# Patient Record
Sex: Female | Born: 1990 | Race: White | Hispanic: No | Marital: Single | Smoking: Former smoker
Health system: Southern US, Community
[De-identification: ages and names within clinical notes are randomized; demographics above are authoritative.]

## PROBLEM LIST (undated history)

## (undated) DIAGNOSIS — B2790 Infectious mononucleosis, unspecified without complication: Secondary | ICD-10-CM

## (undated) DIAGNOSIS — F329 Major depressive disorder, single episode, unspecified: Secondary | ICD-10-CM

## (undated) DIAGNOSIS — N809 Endometriosis, unspecified: Secondary | ICD-10-CM

## (undated) DIAGNOSIS — F419 Anxiety disorder, unspecified: Secondary | ICD-10-CM

## (undated) DIAGNOSIS — F32A Depression, unspecified: Secondary | ICD-10-CM

## (undated) DIAGNOSIS — N83209 Unspecified ovarian cyst, unspecified side: Secondary | ICD-10-CM

## (undated) DIAGNOSIS — N200 Calculus of kidney: Secondary | ICD-10-CM

## (undated) DIAGNOSIS — N289 Disorder of kidney and ureter, unspecified: Secondary | ICD-10-CM

## (undated) HISTORY — PX: LAPAROSCOPIC ENDOMETRIOSIS FULGURATION: SUR769

## (undated) HISTORY — DX: Major depressive disorder, single episode, unspecified: F32.9

## (undated) HISTORY — DX: Depression, unspecified: F32.A

## (undated) HISTORY — PX: TONSILLECTOMY: SUR1361

## (undated) HISTORY — PX: CHOLECYSTECTOMY: SHX55

---

## 2013-03-02 ENCOUNTER — Other Ambulatory Visit: Payer: Self-pay | Admitting: Student

## 2013-03-02 ENCOUNTER — Ambulatory Visit
Admission: RE | Admit: 2013-03-02 | Discharge: 2013-03-02 | Disposition: A | Discharge status: Home / Self Care | Payer: No Typology Code available for payment source | Source: Ambulatory Visit | Attending: Student | Admitting: Student

## 2013-03-02 DIAGNOSIS — R7611 Nonspecific reaction to tuberculin skin test without active tuberculosis: Secondary | ICD-10-CM

## 2013-06-19 ENCOUNTER — Emergency Department (HOSPITAL_COMMUNITY)
Admission: EM | Admit: 2013-06-19 | Discharge: 2013-06-20 | Disposition: A | Discharge status: Home / Self Care | Payer: 59 | Attending: Emergency Medicine | Admitting: Emergency Medicine

## 2013-06-19 ENCOUNTER — Encounter (HOSPITAL_COMMUNITY): Payer: Self-pay | Admitting: *Deleted

## 2013-06-19 DIAGNOSIS — Z3202 Encounter for pregnancy test, result negative: Secondary | ICD-10-CM | POA: Insufficient documentation

## 2013-06-19 DIAGNOSIS — R1031 Right lower quadrant pain: Secondary | ICD-10-CM | POA: Insufficient documentation

## 2013-06-19 DIAGNOSIS — R110 Nausea: Secondary | ICD-10-CM | POA: Insufficient documentation

## 2013-06-19 DIAGNOSIS — F17200 Nicotine dependence, unspecified, uncomplicated: Secondary | ICD-10-CM | POA: Insufficient documentation

## 2013-06-19 DIAGNOSIS — B9689 Other specified bacterial agents as the cause of diseases classified elsewhere: Secondary | ICD-10-CM | POA: Insufficient documentation

## 2013-06-19 DIAGNOSIS — R109 Unspecified abdominal pain: Secondary | ICD-10-CM

## 2013-06-19 DIAGNOSIS — N760 Acute vaginitis: Secondary | ICD-10-CM | POA: Insufficient documentation

## 2013-06-19 DIAGNOSIS — A499 Bacterial infection, unspecified: Secondary | ICD-10-CM | POA: Insufficient documentation

## 2013-06-19 LAB — CBC WITH DIFFERENTIAL/PLATELET
Eosinophils Absolute: 0.1 10*3/uL (ref 0.0–0.7)
Hemoglobin: 14.7 g/dL (ref 12.0–15.0)
Lymphs Abs: 2.7 10*3/uL (ref 0.7–4.0)
MCH: 32.8 pg (ref 26.0–34.0)
Monocytes Relative: 8 % (ref 3–12)
Neutro Abs: 4.7 10*3/uL (ref 1.7–7.7)
Neutrophils Relative %: 58 % (ref 43–77)
Platelets: 233 10*3/uL (ref 150–400)
RBC: 4.48 MIL/uL (ref 3.87–5.11)
WBC: 8.1 10*3/uL (ref 4.0–10.5)

## 2013-06-19 LAB — COMPREHENSIVE METABOLIC PANEL
ALT: 12 U/L (ref 0–35)
Albumin: 4.7 g/dL (ref 3.5–5.2)
Alkaline Phosphatase: 72 U/L (ref 39–117)
Chloride: 101 mEq/L (ref 96–112)
GFR calc Af Amer: >90 mL/min (ref >90)
Glucose, Bld: 94 mg/dL (ref 70–99)
Potassium: 3.7 mEq/L (ref 3.5–5.1)
Sodium: 137 mEq/L (ref 135–145)
Total Bilirubin: 0.2 mg/dL — ABNORMAL LOW (ref 0.3–1.2)
Total Protein: 7.8 g/dL (ref 6.0–8.3)

## 2013-06-19 LAB — URINALYSIS, ROUTINE W REFLEX MICROSCOPIC
Bilirubin Urine: NEGATIVE
Hgb urine dipstick: NEGATIVE
Ketones, ur: NEGATIVE mg/dL
Specific Gravity, Urine: 1.02 (ref 1.005–1.030)
pH: 5.5 (ref 5.0–8.0)

## 2013-06-19 NOTE — ED Notes (Signed)
The pt has had rlq pain for 2 weeks  Nausea  Diarrhea for 2 days.  lmp aug 28th.  Hx ovarian custs

## 2013-06-19 NOTE — ED Notes (Signed)
Pt states that has bilat side pains that have been going on for around 2 weeks. Pt states that the right side hurts worse.

## 2013-06-20 LAB — WET PREP, GENITAL
Trich, Wet Prep: NONE SEEN
Yeast Wet Prep HPF POC: NONE SEEN

## 2013-06-20 LAB — RPR: RPR Ser Ql: NONREACTIVE

## 2013-06-20 MED ORDER — AZITHROMYCIN 250 MG PO TABS
1000.0000 mg | ORAL_TABLET | Freq: Once | ORAL | Status: AC
  Administered 2013-06-20: 1000 mg via ORAL
  Filled 2013-06-20: qty 4

## 2013-06-20 MED ORDER — TRAMADOL HCL 50 MG PO TABS: 50.0000 mg | ORAL_TABLET | Freq: Four times a day (QID) | ORAL | Status: DC | PRN

## 2013-06-20 MED ORDER — METRONIDAZOLE 500 MG PO TABS: 500.0000 mg | ORAL_TABLET | Freq: Two times a day (BID) | ORAL | Status: DC

## 2013-06-20 MED ORDER — CEFTRIAXONE SODIUM 250 MG IJ SOLR
250.0000 mg | Freq: Once | INTRAMUSCULAR | Status: AC
  Administered 2013-06-20: 250 mg via INTRAMUSCULAR
  Filled 2013-06-20: qty 250

## 2013-06-20 MED ORDER — LIDOCAINE HCL (PF) 1 % IJ SOLN
INTRAMUSCULAR | Status: AC
  Administered 2013-06-20: 2 mL
  Filled 2013-06-20: qty 5

## 2013-06-20 MED ORDER — IBUPROFEN 200 MG PO TABS
600.0000 mg | ORAL_TABLET | Freq: Once | ORAL | Status: AC
  Administered 2013-06-20: 600 mg via ORAL
  Filled 2013-06-20: qty 3

## 2013-06-20 NOTE — ED Provider Notes (Signed)
Medical screening examination/treatment/procedure(s) were conducted as a shared visit with non-physician practitioner(s) and myself.  I personally evaluated the patient during the encounter  Brandt Loosen, MD 06/20/13 [id number]

## 2013-06-20 NOTE — ED Provider Notes (Signed)
CSN: [id number]     Arrival date & time 06/19/13  2118 History   First MD Initiated Contact with Patient 06/19/13 2217     Chief Complaint  Patient presents with  . Abdominal Pain    HPI  Jenna Blake is a 22 y.o. female with a PMH of endometriosis and ovarian cysts who presents to the ED for evaluation of abdominal pain.  History was provided by the patient.  Patient states she has had intermittent right lower quadrant abdominal pain for the past 2 weeks. She states her pain is a burning and stabbing sensation in her right lower quadrant with radiation to her right lower back. Nothing makes her pain worse. She states that when she lays down her pain feels better. She has tried taking Tylenol with no relief. She states that her pain is similar to ovarian cysts that she has had in the past. She states she also has endometriosis which is similar to her pain today as well. She states that she has had some vaginal bleeding the past 2 days. She denies any vaginal discharge, dysuria, or hematuria. Her last normal menstrual period was August 21.  She states that she's had intermittent nausea but no emesis. She has also had one day history of diarrhea which has resolved. She denies any hematochezia. She states she has a history of cholecystectomy however denies any other abdominal surgeries. She states she is otherwise been well with no recent fever, chills, rhinorrhea, congestion, sore throat, chest pain, shortness of breath, dizziness, lightheadedness, headache or leg edema. She is currently sexually active. She denies any new recent partners. She denies any history of STI's in the past.   History reviewed. No pertinent past medical history. History reviewed. No pertinent past surgical history. No family history on file. History  Substance Use Topics  . Smoking status: Current Every Day Smoker  . Smokeless tobacco: Not on file  . Alcohol Use: No   OB History   Grav Para Term Preterm Abortions TAB SAB  Ect Mult Living                 Review of Systems  Constitutional: Negative for fever, chills, activity change, appetite change and fatigue.  HENT: Negative for congestion, sore throat, rhinorrhea, neck pain and neck stiffness.   Eyes: Negative for visual disturbance.  Respiratory: Negative for cough and shortness of breath.   Cardiovascular: Negative for chest pain and leg swelling.  Gastrointestinal: Positive for nausea, abdominal pain and diarrhea (resolved). Negative for vomiting, constipation and blood in stool.  Endocrine: Negative for polyuria.  Genitourinary: Positive for vaginal bleeding. Negative for dysuria, hematuria, vaginal discharge, difficulty urinating, genital sores and vaginal pain.  Skin: Negative for rash and wound.  Neurological: Negative for dizziness, syncope, weakness, light-headedness, numbness and headaches.  Psychiatric/Behavioral: Negative for confusion.    Allergies  Ativan and Betadine  Home Medications   Current Outpatient Rx  Name  Route  Sig  Dispense  Refill  . acetaminophen (TYLENOL) 325 MG tablet   Oral   Take 325 mg by mouth every 6 (six) hours as needed for pain.         Marland Kitchen ibuprofen (ADVIL,MOTRIN) 200 MG tablet   Oral   Take 400 mg by mouth every 6 (six) hours as needed for pain.         . Pseudoeph-Doxylamine-DM-APAP (NYQUIL PO)   Oral   Take 15 mLs by mouth daily as needed (flu like symptoms).         Marland Kitchen  metroNIDAZOLE (FLAGYL) 500 MG tablet   Oral   Take 1 tablet (500 mg total) by mouth 2 (two) times daily.   14 tablet   0   . traMADol (ULTRAM) 50 MG tablet   Oral   Take 1 tablet (50 mg total) by mouth every 6 (six) hours as needed for pain.   15 tablet   0    BP 127/66  Pulse 37  Temp(Src) 98 F (36.7 C) (Oral)  Resp 16  SpO2 99%  LMP 05/27/2013  Filed Vitals:   06/20/13 0015 06/20/13 0030 06/20/13 0045 06/20/13 0100  BP: 129/67 127/66 122/62 137/75  Pulse: 86 37 72 93  Temp:      TempSrc:      Resp:       SpO2: 99% 99% 97% 100%    Physical Exam  Nursing note and vitals reviewed. Constitutional: She is oriented to person, place, and time. She appears well-developed and well-nourished. No distress.  HENT:  Head: Normocephalic and atraumatic.  Right Ear: External ear normal.  Left Ear: External ear normal.  Nose: Nose normal.  Mouth/Throat: Oropharynx is clear and moist. No oropharyngeal exudate.  Eyes: Conjunctivae are normal. Pupils are equal, round, and reactive to light. Right eye exhibits no discharge. Left eye exhibits no discharge.  Neck: Normal range of motion. Neck supple.  Cardiovascular: Normal rate, regular rhythm, normal heart sounds and intact distal pulses.  Exam reveals no gallop and no friction rub.   No murmur heard. Pulmonary/Chest: Effort normal and breath sounds normal. No respiratory distress. She has no wheezes. She has no rales. She exhibits no tenderness.  Abdominal: Soft. Bowel sounds are normal. She exhibits no distension and no mass. There is tenderness. There is no rebound and no guarding.  Mild RLQ tenderness to palpation.  No increased right lower quadrant tenderness with left lower quadrant tenderness.  Genitourinary: Vaginal discharge found.  Cervical motion tenderness present. Right adnexal tenderness. No left adnexal tenderness. Moderate amount of thick white vaginal discharge present in the vaginal vault. No vaginal bleeding. No external genital sores or lacerations.  Musculoskeletal: Normal range of motion. She exhibits no edema and no tenderness.  Mild tenderness to palpation to the right lower lumbar region. Patient able to sit up for exam with ease. No CVA tenderness bilaterally  Neurological: She is alert and oriented to person, place, and time.  Skin: Skin is warm and dry. She is not diaphoretic.    ED Course  Procedures (including critical care time) Labs Review Labs Reviewed  WET PREP, GENITAL - Abnormal; Notable for the following:    Clue  Cells Wet Prep HPF POC FEW (*)    WBC, Wet Prep HPF POC FEW (*)    All other components within normal limits  COMPREHENSIVE METABOLIC PANEL - Abnormal; Notable for the following:    Total Bilirubin 0.2 (*)    All other components within normal limits  GC/CHLAMYDIA PROBE AMP  CBC WITH DIFFERENTIAL  LIPASE, BLOOD  URINALYSIS, ROUTINE W REFLEX MICROSCOPIC  RPR  HIV ANTIBODY (ROUTINE TESTING)  POCT PREGNANCY, URINE   Imaging Review No results found.  Results for orders placed during the hospital encounter of 06/19/13  WET PREP, GENITAL      Result Value Range   Yeast Wet Prep HPF POC NONE SEEN  NONE SEEN   Trich, Wet Prep NONE SEEN  NONE SEEN   Clue Cells Wet Prep HPF POC FEW (*) NONE SEEN   WBC, Wet Prep HPF POC  FEW (*) NONE SEEN  CBC WITH DIFFERENTIAL      Result Value Range   WBC 8.1  4.0 - 10.5 K/uL   RBC 4.48  3.87 - 5.11 MIL/uL   Hemoglobin 14.7  12.0 - 15.0 g/dL   HCT 30.8  65.7 - 84.6 %   MCV 94.0  78.0 - 100.0 fL   MCH 32.8  26.0 - 34.0 pg   MCHC 34.9  30.0 - 36.0 g/dL   RDW 96.2  95.2 - 84.1 %   Platelets 233  150 - 400 K/uL   Neutrophils Relative % 58  43 - 77 %   Neutro Abs 4.7  1.7 - 7.7 K/uL   Lymphocytes Relative 33  12 - 46 %   Lymphs Abs 2.7  0.7 - 4.0 K/uL   Monocytes Relative 8  3 - 12 %   Monocytes Absolute 0.6  0.1 - 1.0 K/uL   Eosinophils Relative 1  0 - 5 %   Eosinophils Absolute 0.1  0.0 - 0.7 K/uL   Basophils Relative 0  0 - 1 %   Basophils Absolute 0.0  0.0 - 0.1 K/uL  COMPREHENSIVE METABOLIC PANEL      Result Value Range   Sodium 137  135 - 145 mEq/L   Potassium 3.7  3.5 - 5.1 mEq/L   Chloride 101  96 - 112 mEq/L   CO2 26  19 - 32 mEq/L   Glucose, Bld 94  70 - 99 mg/dL   BUN 9  6 - 23 mg/dL   Creatinine, Ser 3.24  0.50 - 1.10 mg/dL   Calcium 9.5  8.4 - 40.1 mg/dL   Total Protein 7.8  6.0 - 8.3 g/dL   Albumin 4.7  3.5 - 5.2 g/dL   AST 23  0 - 37 U/L   ALT 12  0 - 35 U/L   Alkaline Phosphatase 72  39 - 117 U/L   Total Bilirubin 0.2 (*)  0.3 - 1.2 mg/dL   GFR calc non Af Amer >90  >90 mL/min   GFR calc Af Amer >90  >90 mL/min  LIPASE, BLOOD      Result Value Range   Lipase 19  11 - 59 U/L  URINALYSIS, ROUTINE W REFLEX MICROSCOPIC      Result Value Range   Color, Urine YELLOW  YELLOW   APPearance CLEAR  CLEAR   Specific Gravity, Urine 1.020  1.005 - 1.030   pH 5.5  5.0 - 8.0   Glucose, UA NEGATIVE  NEGATIVE mg/dL   Hgb urine dipstick NEGATIVE  NEGATIVE   Bilirubin Urine NEGATIVE  NEGATIVE   Ketones, ur NEGATIVE  NEGATIVE mg/dL   Protein, ur NEGATIVE  NEGATIVE mg/dL   Urobilinogen, UA 0.2  0.0 - 1.0 mg/dL   Nitrite NEGATIVE  NEGATIVE   Leukocytes, UA NEGATIVE  NEGATIVE  POCT PREGNANCY, URINE      Result Value Range   Preg Test, Ur NEGATIVE  NEGATIVE     MDM   1. Abdominal pain   2. Bacterial vaginosis     Jenna Blake is a 22 y.o. female with a PMH of endometriosis and ovarian cysts who presents to the ED for evaluation of abdominal pain. CBC, CMP, lipase, UA and urine pregnancy ordered to further evaluate abdominal pain.  Ibuprofen was ordered for symptomatic relief.     Rechecks  11:50 PM = pelvic exam performed at bedside with RN Cassie present.  Wet mount and GC Chlamydia ordered.  HIV and RPR ordered.  Azithromycin and Rocephin ordered. 1:00 AM = Patient smiling and talking with her boyfriend who is present in the ED. No acute distress.     Etiology of abdominal pain is possibly due to ovarian cysts vs. cervicitis vs. endometriosis.  Patient states she has a history of ovarian cysts and endometriosis which is similar to her pain today.  Appendicitis is unlikely given over a 2 week history of similar unchanged and intermittent abdominal pain.  She also has no leukocytosis, fever, or emesis.  Patient is non-toxic in appearance and remained in no acute distress.  Patient had cervical motion tenderness and right adnexal tenderness on exam. She was treated with azithromycin and Rocephin in the emergency  department. Her GC results are pending. Patient was prescribed tramadol for outpatient management of her pain.  She was also prescribed Flagyl for bacterial vaginosis.  He shouldn't encouraged not to drink alcohol while taking this medication.  Patient was given appendicitis precautions. Patient was instructed to return to the ED if they experience any worsening/changing pain, vomiting, fever, or other concerns.  Patient was given referral to the women's health clinic and instructed to make an appointment as soon as possible. Patient was in agreement with discharge and plan.       Final impressions: 1. Abdominal pain 2. Bacterial vaginosis     Luiz Iron PA-C   This patient was discussed with Dr. Lavella Lemons who evaluated the patient in the ED.         Jillyn Ledger, PA-C 06/20/13 0221

## 2013-08-22 ENCOUNTER — Emergency Department (HOSPITAL_COMMUNITY)
Admission: EM | Admit: 2013-08-22 | Discharge: 2013-08-22 | Disposition: A | Discharge status: Home / Self Care | Payer: 59 | Attending: Emergency Medicine | Admitting: Emergency Medicine

## 2013-08-22 ENCOUNTER — Encounter (HOSPITAL_COMMUNITY): Payer: Self-pay | Admitting: Emergency Medicine

## 2013-08-22 DIAGNOSIS — Z880 Allergy status to penicillin: Secondary | ICD-10-CM | POA: Insufficient documentation

## 2013-08-22 DIAGNOSIS — N809 Endometriosis, unspecified: Secondary | ICD-10-CM

## 2013-08-22 DIAGNOSIS — R630 Anorexia: Secondary | ICD-10-CM | POA: Insufficient documentation

## 2013-08-22 DIAGNOSIS — F17200 Nicotine dependence, unspecified, uncomplicated: Secondary | ICD-10-CM | POA: Insufficient documentation

## 2013-08-22 DIAGNOSIS — N946 Dysmenorrhea, unspecified: Secondary | ICD-10-CM

## 2013-08-22 DIAGNOSIS — R110 Nausea: Secondary | ICD-10-CM | POA: Insufficient documentation

## 2013-08-22 DIAGNOSIS — N898 Other specified noninflammatory disorders of vagina: Secondary | ICD-10-CM | POA: Insufficient documentation

## 2013-08-22 DIAGNOSIS — Z3202 Encounter for pregnancy test, result negative: Secondary | ICD-10-CM | POA: Insufficient documentation

## 2013-08-22 HISTORY — DX: Endometriosis, unspecified: N80.9

## 2013-08-22 LAB — CBC WITH DIFFERENTIAL/PLATELET
Basophils Absolute: 0 10*3/uL (ref 0.0–0.1)
Basophils Relative: 0 % (ref 0–1)
Eosinophils Absolute: 0.1 10*3/uL (ref 0.0–0.7)
Eosinophils Relative: 1 % (ref 0–5)
HCT: 38.4 % (ref 36.0–46.0)
Lymphocytes Relative: 34 % (ref 12–46)
MCH: 33.4 pg (ref 26.0–34.0)
MCHC: 35.7 g/dL (ref 30.0–36.0)
MCV: 93.7 fL (ref 78.0–100.0)
Monocytes Absolute: 0.7 10*3/uL (ref 0.1–1.0)
RDW: 12.5 % (ref 11.5–15.5)

## 2013-08-22 LAB — URINALYSIS, ROUTINE W REFLEX MICROSCOPIC
Nitrite: NEGATIVE
Specific Gravity, Urine: 1.033 — ABNORMAL HIGH (ref 1.005–1.030)
Urobilinogen, UA: 1 mg/dL (ref 0.0–1.0)

## 2013-08-22 LAB — COMPREHENSIVE METABOLIC PANEL
AST: 21 U/L (ref 0–37)
CO2: 25 mEq/L (ref 19–32)
Calcium: 9.2 mg/dL (ref 8.4–10.5)
Creatinine, Ser: 0.62 mg/dL (ref 0.50–1.10)
GFR calc non Af Amer: >90 mL/min (ref >90)

## 2013-08-22 LAB — URINE MICROSCOPIC-ADD ON

## 2013-08-22 LAB — POCT PREGNANCY, URINE: Preg Test, Ur: NEGATIVE

## 2013-08-22 MED ORDER — ONDANSETRON 4 MG PO TBDP
4.0000 mg | ORAL_TABLET | Freq: Once | ORAL | Status: AC
  Administered 2013-08-22: 4 mg via ORAL
  Filled 2013-08-22: qty 1

## 2013-08-22 MED ORDER — TRAMADOL HCL 50 MG PO TABS: 50.0000 mg | ORAL_TABLET | Freq: Four times a day (QID) | ORAL | Status: DC | PRN

## 2013-08-22 MED ORDER — KETOROLAC TROMETHAMINE 30 MG/ML IJ SOLN
30.0000 mg | Freq: Once | INTRAMUSCULAR | Status: AC
  Administered 2013-08-22: 30 mg via INTRAMUSCULAR
  Filled 2013-08-22: qty 1

## 2013-08-22 MED ORDER — TRAMADOL HCL 50 MG PO TABS
50.0000 mg | ORAL_TABLET | Freq: Once | ORAL | Status: AC
  Administered 2013-08-22: 50 mg via ORAL
  Filled 2013-08-22: qty 1

## 2013-08-22 NOTE — ED Notes (Signed)
Pt unable to void for UA sample at this time. 

## 2013-08-22 NOTE — ED Provider Notes (Addendum)
CSN: [id number]     Arrival date & time 08/22/13  1044 History   First MD Initiated Contact with Patient 08/22/13 1109     Chief Complaint  Patient presents with  . Abdominal Pain   (Consider location/radiation/quality/duration/timing/severity/associated sxs/prior Treatment) Patient is a 22 y.o. female presenting with abdominal pain. The history is provided by the patient.  Abdominal Pain Pain location: bilateral pelvic pain. Pain quality: cramping, gnawing, squeezing and stabbing   Pain radiates to:  Does not radiate Pain severity:  Severe Onset quality:  Gradual Duration:  2 hours Timing:  Constant Progression:  Worsening Chronicity:  Recurrent Context comment:  Hx of endometriosis and started menses today Relieved by:  Nothing Worsened by:  Nothing tried Ineffective treatments:  NSAIDs Associated symptoms: anorexia, nausea and vaginal bleeding   Associated symptoms: no constipation, no diarrhea, no dysuria, no fever and no vomiting   Risk factors: NSAID use     Past Medical History  Diagnosis Date  . Endometriosis    Past Surgical History  Procedure Laterality Date  . Cholecystectomy     History reviewed. No pertinent family history. History  Substance Use Topics  . Smoking status: Current Every Day Smoker    Types: Cigarettes  . Smokeless tobacco: Not on file  . Alcohol Use: No   OB History   Grav Para Term Preterm Abortions TAB SAB Ect Mult Living                 Review of Systems  Constitutional: Negative for fever.  Gastrointestinal: Positive for nausea, abdominal pain and anorexia. Negative for vomiting, diarrhea and constipation.  Genitourinary: Positive for vaginal bleeding and menstrual problem. Negative for dysuria.  All other systems reviewed and are negative.    Allergies  Amoxicillin; Ativan; and Betadine  Home Medications  No current outpatient prescriptions on file. BP 127/75  Pulse 87  Temp(Src) 98.4 F (36.9 C) (Oral)  Resp 18  Wt  118 lb 3 oz (53.609 kg)  SpO2 100%  LMP 08/22/2013 Physical Exam  Nursing note and vitals reviewed. Constitutional: She is oriented to person, place, and time. She appears well-developed and well-nourished. She appears distressed.  HENT:  Head: Normocephalic and atraumatic.  Mouth/Throat: Oropharynx is clear and moist.  Eyes: Conjunctivae and EOM are normal. Pupils are equal, round, and reactive to light.  Neck: Normal range of motion. Neck supple.  Cardiovascular: Normal rate, regular rhythm and intact distal pulses.   No murmur heard. Pulmonary/Chest: Effort normal and breath sounds normal. No respiratory distress. She has no wheezes. She has no rales.  Abdominal: Soft. She exhibits no distension. There is tenderness. There is no rebound and no guarding.    Lower pelvic tenderness bilaterally  Musculoskeletal: Normal range of motion. She exhibits no edema and no tenderness.  Neurological: She is alert and oriented to person, place, and time.  Skin: Skin is warm and dry. No rash noted. No erythema.  Psychiatric: She has a normal mood and affect. Her behavior is normal.    ED Course  Procedures (including critical care time) Labs Review Labs Reviewed  URINALYSIS, ROUTINE W REFLEX MICROSCOPIC - Abnormal; Notable for the following:    APPearance CLOUDY (*)    Specific Gravity, Urine 1.033 (*)    Hgb urine dipstick LARGE (*)    Leukocytes, UA TRACE (*)    All other components within normal limits  URINE MICROSCOPIC-ADD ON - Abnormal; Notable for the following:    Squamous Epithelial / LPF FEW (*)  Bacteria, UA FEW (*)    All other components within normal limits  CBC WITH DIFFERENTIAL  COMPREHENSIVE METABOLIC PANEL  POCT PREGNANCY, URINE   Imaging Review No results found.  EKG Interpretation   None       MDM   1. Dysmenorrhea   2. Endometriosis     Patient presents with severe pelvic pain that started today with her menses. She has a history of endometriosis  and states that intermittently she'll gets severe pain like this the last episode approximately one year ago. She has not missed any of her periods and tried Advil at home without improvement. She has normal vital signs note for abdominal pain the pain is in bilateral lower pelvic quadrants consistent with menses. No symptoms concerning for STD patient was recently checked approximately 2 months ago and was negative for GC Chlamydia, HIV and syphilis.  UA and UPT pending.  Pt given zofran and toradol for pain.  12:34 PM UPT neg.  Gwyneth Sprout, MD 08/22/13 1234  Gwyneth Sprout, MD 08/22/13 1247

## 2013-08-22 NOTE — ED Notes (Signed)
Pt reports lower abd pain that started about an hour ago. Reports some nausea but no vomiting. Skin warm and dry. Denies any pain with urination.

## 2013-08-29 ENCOUNTER — Encounter (HOSPITAL_COMMUNITY): Payer: Self-pay | Admitting: Emergency Medicine

## 2013-08-29 ENCOUNTER — Emergency Department (HOSPITAL_COMMUNITY)
Admission: EM | Admit: 2013-08-29 | Discharge: 2013-08-29 | Disposition: A | Discharge status: Home / Self Care | Payer: 59 | Attending: Emergency Medicine | Admitting: Emergency Medicine

## 2013-08-29 ENCOUNTER — Emergency Department (HOSPITAL_COMMUNITY): Payer: 59

## 2013-08-29 DIAGNOSIS — N949 Unspecified condition associated with female genital organs and menstrual cycle: Secondary | ICD-10-CM | POA: Insufficient documentation

## 2013-08-29 DIAGNOSIS — Z3202 Encounter for pregnancy test, result negative: Secondary | ICD-10-CM | POA: Insufficient documentation

## 2013-08-29 DIAGNOSIS — Z87891 Personal history of nicotine dependence: Secondary | ICD-10-CM | POA: Insufficient documentation

## 2013-08-29 DIAGNOSIS — G8929 Other chronic pain: Secondary | ICD-10-CM | POA: Insufficient documentation

## 2013-08-29 DIAGNOSIS — R197 Diarrhea, unspecified: Secondary | ICD-10-CM | POA: Insufficient documentation

## 2013-08-29 DIAGNOSIS — R102 Pelvic and perineal pain: Secondary | ICD-10-CM

## 2013-08-29 DIAGNOSIS — R110 Nausea: Secondary | ICD-10-CM | POA: Insufficient documentation

## 2013-08-29 DIAGNOSIS — N926 Irregular menstruation, unspecified: Secondary | ICD-10-CM | POA: Insufficient documentation

## 2013-08-29 DIAGNOSIS — Z9089 Acquired absence of other organs: Secondary | ICD-10-CM | POA: Insufficient documentation

## 2013-08-29 DIAGNOSIS — N939 Abnormal uterine and vaginal bleeding, unspecified: Secondary | ICD-10-CM | POA: Insufficient documentation

## 2013-08-29 LAB — URINALYSIS, ROUTINE W REFLEX MICROSCOPIC
Bilirubin Urine: NEGATIVE
Glucose, UA: NEGATIVE mg/dL
Hgb urine dipstick: NEGATIVE
Ketones, ur: NEGATIVE mg/dL
pH: 6 (ref 5.0–8.0)

## 2013-08-29 LAB — URINE MICROSCOPIC-ADD ON

## 2013-08-29 MED ORDER — MORPHINE SULFATE 4 MG/ML IJ SOLN
4.0000 mg | Freq: Once | INTRAMUSCULAR | Status: AC
  Administered 2013-08-29: 4 mg via INTRAVENOUS
  Filled 2013-08-29: qty 1

## 2013-08-29 MED ORDER — KETOROLAC TROMETHAMINE 30 MG/ML IJ SOLN
30.0000 mg | Freq: Once | INTRAMUSCULAR | Status: AC
  Administered 2013-08-29: 30 mg via INTRAVENOUS
  Filled 2013-08-29: qty 1

## 2013-08-29 MED ORDER — ONDANSETRON HCL 4 MG PO TABS: 4.0000 mg | ORAL_TABLET | Freq: Four times a day (QID) | ORAL | Status: DC

## 2013-08-29 MED ORDER — ONDANSETRON HCL 4 MG/2ML IJ SOLN
4.0000 mg | Freq: Once | INTRAMUSCULAR | Status: AC
  Administered 2013-08-29: 4 mg via INTRAVENOUS
  Filled 2013-08-29: qty 2

## 2013-08-29 MED ORDER — TRAMADOL HCL 50 MG PO TABS: 50.0000 mg | ORAL_TABLET | Freq: Four times a day (QID) | ORAL | Status: DC | PRN

## 2013-08-29 MED ORDER — ONDANSETRON 4 MG PO TBDP
8.0000 mg | ORAL_TABLET | Freq: Once | ORAL | Status: AC
  Administered 2013-08-29: 8 mg via ORAL
  Filled 2013-08-29: qty 2

## 2013-08-29 NOTE — ED Provider Notes (Signed)
CSN: [id number]     Arrival date & time 08/29/13  1457 History   First MD Initiated Contact with Patient 08/29/13 1531     Chief Complaint  Patient presents with  . Abdominal Pain  . Nausea  . Diarrhea   (Consider location/radiation/quality/duration/timing/severity/associated sxs/prior Treatment) HPI Pt is a 22yo female with hx of endometriosis, ovarian cycsts and chronic abdominal pain c/o 1 week hx of severe lower abdominal pain that is sharp and cramping in nature, 10/10.  Pt was seen on 11/16 for same after pain initially started and gradually worsened but was discharged home with tramadol, which pt states provides moderate relief.  Reports having been on her menstrual cycle the last week but ended 2 days ago, still having pain.  Pt thought pain would subside after menstruation stopped.  Reports having nausea w/o vomiting but 1-2 episodes of diarrhea for the last week w/o blood or mucous.  Reports having cholecystectomy as well as procedures 2 years ago to remove some of her endometriosis. Denies fever. Denies dysuria or vaginal discharge.  Had negative STD screening 1 mo ago.  Past Medical History  Diagnosis Date  . Endometriosis    Past Surgical History  Procedure Laterality Date  . Cholecystectomy     No family history on file. History  Substance Use Topics  . Smoking status: Former Smoker    Types: Cigarettes  . Smokeless tobacco: Not on file  . Alcohol Use: No     Comment: Former user   OB History   Grav Para Term Preterm Abortions TAB SAB Ect Mult Living                 Review of Systems  Constitutional: Negative for fever and chills.  Gastrointestinal: Positive for nausea, abdominal pain and diarrhea. Negative for vomiting.  Genitourinary: Positive for menstrual problem and pelvic pain. Negative for dysuria, urgency, hematuria, flank pain, decreased urine volume, vaginal bleeding, vaginal discharge and vaginal pain.  All other systems reviewed and are  negative.    Allergies  Amoxicillin; Ativan; and Betadine  Home Medications   Current Outpatient Rx  Name  Route  Sig  Dispense  Refill  . ondansetron (ZOFRAN) 4 MG tablet   Oral   Take 1 tablet (4 mg total) by mouth every 6 (six) hours.   12 tablet   0   . traMADol (ULTRAM) 50 MG tablet   Oral   Take 1 tablet (50 mg total) by mouth every 6 (six) hours as needed for moderate pain.   10 tablet   0    BP 98/56  Pulse 80  Temp(Src) 98.3 F (36.8 C) (Oral)  Resp 18  Ht 5\' 2"  (1.575 m)  Wt 113 lb 8 oz (51.483 kg)  BMI 20.75 kg/m2  SpO2 99%  LMP 08/22/2013 Physical Exam  Nursing note and vitals reviewed. Constitutional: She appears well-developed and well-nourished. No distress.  HENT:  Head: Normocephalic and atraumatic.  Eyes: Conjunctivae are normal. No scleral icterus.  Neck: Normal range of motion.  Cardiovascular: Normal rate, regular rhythm and normal heart sounds.   Pulmonary/Chest: Effort normal and breath sounds normal. No respiratory distress. She has no wheezes. She has no rales. She exhibits no tenderness.  Abdominal: Soft. Bowel sounds are normal. She exhibits no distension and no mass. There is tenderness. There is no rebound ( lower abdomen, greatest in LLQ) and no guarding.    Genitourinary: No vaginal discharge found.  Musculoskeletal: Normal range of motion.  Neurological: She  is alert.  Skin: Skin is warm and dry. She is not diaphoretic.    ED Course  Procedures (including critical care time) Labs Review Labs Reviewed  URINALYSIS, ROUTINE W REFLEX MICROSCOPIC - Abnormal; Notable for the following:    APPearance CLOUDY (*)    Leukocytes, UA SMALL (*)    All other components within normal limits  URINE MICROSCOPIC-ADD ON - Abnormal; Notable for the following:    Squamous Epithelial / LPF FEW (*)    All other components within normal limits  PREGNANCY, URINE   Imaging Review US Transvaginal Non-ob  08/29/2013   CLINICAL DATA:  Left-sided  pelvic pain. Endometriosis. LMP 08/21/2013  EXAM: TRANSABDOMINAL AND TRANSVAGINAL ULTRASOUND OF PELVIS  TECHNIQUE: Both transabdominal and transvaginal ultrasound examinations of the pelvis were performed. Transabdominal technique was performed for global imaging of the pelvis including uterus, ovaries, adnexal regions, and pelvic cul-de-sac. It was necessary to proceed with endovaginal exam following the transabdominal exam to visualize the endometrium and ovaries.  COMPARISON:  None  FINDINGS: Uterus  Measurements: 6.4 x 2.6 x 3.7 cm. No fibroids or other mass visualized.  Endometrium  Thickness: 2 mm.  No focal abnormality visualized.  Right ovary  Measurements: 3.7 x 2.0 x 1.9 cm. Normal appearance/no adnexal mass.  Left ovary  Measurements: 3.1 x 2.4 x 1.8 cm. Normal appearance/no adnexal mass.  Other findings  Tiny amount of free fluid noted in right adnexa.  IMPRESSION: Negative. No pelvic mass or other significant abnormality identified.   Electronically Signed   By: Myles Rosenthal M.D.   On: 08/29/2013 18:14   US Pelvis Complete  08/29/2013   CLINICAL DATA:  Left-sided pelvic pain. Endometriosis. LMP 08/21/2013  EXAM: TRANSABDOMINAL AND TRANSVAGINAL ULTRASOUND OF PELVIS  TECHNIQUE: Both transabdominal and transvaginal ultrasound examinations of the pelvis were performed. Transabdominal technique was performed for global imaging of the pelvis including uterus, ovaries, adnexal regions, and pelvic cul-de-sac. It was necessary to proceed with endovaginal exam following the transabdominal exam to visualize the endometrium and ovaries.  COMPARISON:  None  FINDINGS: Uterus  Measurements: 6.4 x 2.6 x 3.7 cm. No fibroids or other mass visualized.  Endometrium  Thickness: 2 mm.  No focal abnormality visualized.  Right ovary  Measurements: 3.7 x 2.0 x 1.9 cm. Normal appearance/no adnexal mass.  Left ovary  Measurements: 3.1 x 2.4 x 1.8 cm. Normal appearance/no adnexal mass.  Other findings  Tiny amount of free fluid  noted in right adnexa.  IMPRESSION: Negative. No pelvic mass or other significant abnormality identified.   Electronically Signed   By: Myles Rosenthal M.D.   On: 08/29/2013 18:14    EKG Interpretation   None       MDM   1. Pelvic pain    Pt with hx of endometreosis c/o lower abdominal and pelvic pain, similar to previous episodes, however has not improved since onset, 11/16 where she was evaluated in ED and discharged with pain medication. Reviewed pt's medical chart, normal CBC and CMP during that visit. Pt reports no new sexual partners and negative STD screen 24mo ago.  No vaginal discharge.  Urine preg: negative UA: unremarkable Pelvic U/S: no acute findings.  Pain did improve with morphine and zofran in ED  Discussed findings with pt, advised that pain is likely due to her endometriosis and will need to establish GYN care, [facility] further evaluation and management of pelvic pain.  Rx: tramadol and zofran. Return precautions provided. Pt verbalized understanding  and agreement with tx plan.   Junius Finner, PA-C 08/29/13 1857

## 2013-08-29 NOTE — ED Notes (Signed)
The pt has n at present no distress

## 2013-08-29 NOTE — ED Notes (Signed)
Patient transported to Ultrasound 

## 2013-08-29 NOTE — ED Notes (Signed)
The pt has had chronic abd pain and has been seen here 2 other times in the pastmonth for the same.  lmp 2 days ago.

## 2013-08-29 NOTE — ED Notes (Signed)
The pt reports that her pain is not much better 

## 2013-08-29 NOTE — ED Notes (Signed)
Pt c/o lower abdominal pain that radiates to low back onset 08/22/2013. Pt seen here at onset but not feeling any better. Pt reports nausea and diarrhea.

## 2013-08-29 NOTE — ED Provider Notes (Signed)
Medical screening examination/treatment/procedure(s) were performed by non-physician practitioner and as supervising physician I was immediately available for consultation/collaboration.  EKG Interpretation   None         Richardean Canal, MD 08/29/13 2004

## 2013-09-07 ENCOUNTER — Emergency Department (HOSPITAL_COMMUNITY)
Admission: EM | Admit: 2013-09-07 | Discharge: 2013-09-08 | Disposition: A | Discharge status: Home / Self Care | Payer: 59 | Attending: Emergency Medicine | Admitting: Emergency Medicine

## 2013-09-07 ENCOUNTER — Encounter (HOSPITAL_COMMUNITY): Payer: Self-pay | Admitting: Emergency Medicine

## 2013-09-07 DIAGNOSIS — R350 Frequency of micturition: Secondary | ICD-10-CM | POA: Insufficient documentation

## 2013-09-07 DIAGNOSIS — R109 Unspecified abdominal pain: Secondary | ICD-10-CM

## 2013-09-07 DIAGNOSIS — Z87891 Personal history of nicotine dependence: Secondary | ICD-10-CM | POA: Insufficient documentation

## 2013-09-07 DIAGNOSIS — M545 Low back pain, unspecified: Secondary | ICD-10-CM | POA: Insufficient documentation

## 2013-09-07 DIAGNOSIS — R1084 Generalized abdominal pain: Secondary | ICD-10-CM | POA: Insufficient documentation

## 2013-09-07 DIAGNOSIS — M546 Pain in thoracic spine: Secondary | ICD-10-CM | POA: Insufficient documentation

## 2013-09-07 DIAGNOSIS — IMO0001 Reserved for inherently not codable concepts without codable children: Secondary | ICD-10-CM | POA: Insufficient documentation

## 2013-09-07 DIAGNOSIS — Z3202 Encounter for pregnancy test, result negative: Secondary | ICD-10-CM | POA: Insufficient documentation

## 2013-09-07 DIAGNOSIS — Z8742 Personal history of other diseases of the female genital tract: Secondary | ICD-10-CM | POA: Insufficient documentation

## 2013-09-07 DIAGNOSIS — Z881 Allergy status to other antibiotic agents status: Secondary | ICD-10-CM | POA: Insufficient documentation

## 2013-09-07 DIAGNOSIS — Z888 Allergy status to other drugs, medicaments and biological substances status: Secondary | ICD-10-CM | POA: Insufficient documentation

## 2013-09-07 LAB — COMPREHENSIVE METABOLIC PANEL
ALT: 9 U/L (ref 0–35)
AST: 20 U/L (ref 0–37)
Albumin: 4.5 g/dL (ref 3.5–5.2)
BUN: 7 mg/dL (ref 6–23)
CO2: 26 mEq/L (ref 19–32)
Calcium: 9 mg/dL (ref 8.4–10.5)
Chloride: 102 mEq/L (ref 96–112)
GFR calc Af Amer: >90 mL/min (ref >90)
GFR calc non Af Amer: >90 mL/min (ref >90)
Glucose, Bld: 91 mg/dL (ref 70–99)
Sodium: 139 mEq/L (ref 135–145)
Total Bilirubin: 0.4 mg/dL (ref 0.3–1.2)

## 2013-09-07 LAB — CBC WITH DIFFERENTIAL/PLATELET
Eosinophils Absolute: 0 10*3/uL (ref 0.0–0.7)
Eosinophils Relative: 0 % (ref 0–5)
HCT: 38.5 % (ref 36.0–46.0)
Lymphocytes Relative: 41 % (ref 12–46)
Lymphs Abs: 3 10*3/uL (ref 0.7–4.0)
MCV: 94.1 fL (ref 78.0–100.0)
Monocytes Absolute: 0.8 10*3/uL (ref 0.1–1.0)
Monocytes Relative: 11 % (ref 3–12)
Platelets: 220 10*3/uL (ref 150–400)
RBC: 4.09 MIL/uL (ref 3.87–5.11)
WBC: 7.2 10*3/uL (ref 4.0–10.5)

## 2013-09-07 LAB — URINE MICROSCOPIC-ADD ON

## 2013-09-07 LAB — URINALYSIS, ROUTINE W REFLEX MICROSCOPIC
Glucose, UA: NEGATIVE mg/dL
Ketones, ur: NEGATIVE mg/dL
Specific Gravity, Urine: 1.02 (ref 1.005–1.030)

## 2013-09-07 NOTE — ED Notes (Signed)
Pt. reports left upper flank pain onset today , denies injury , no dysuria or hematuria , no fever or chills.

## 2013-09-08 ENCOUNTER — Encounter (HOSPITAL_COMMUNITY): Payer: Self-pay | Admitting: Radiology

## 2013-09-08 ENCOUNTER — Emergency Department (HOSPITAL_COMMUNITY): Payer: 59

## 2013-09-08 LAB — WET PREP, GENITAL: Clue Cells Wet Prep HPF POC: NONE SEEN

## 2013-09-08 MED ORDER — IOHEXOL 300 MG/ML  SOLN
25.0000 mL | INTRAMUSCULAR | Status: AC
  Administered 2013-09-08: 25 mL via ORAL

## 2013-09-08 MED ORDER — SODIUM CHLORIDE 0.9 % IV BOLUS (SEPSIS)
1000.0000 mL | Freq: Once | INTRAVENOUS | Status: AC
  Administered 2013-09-08: 1000 mL via INTRAVENOUS

## 2013-09-08 MED ORDER — HYDROMORPHONE HCL PF 1 MG/ML IJ SOLN
1.0000 mg | Freq: Once | INTRAMUSCULAR | Status: AC
  Administered 2013-09-08: 1 mg via INTRAVENOUS
  Filled 2013-09-08: qty 1

## 2013-09-08 MED ORDER — TRAMADOL HCL 50 MG PO TABS: 50.0000 mg | ORAL_TABLET | Freq: Four times a day (QID) | ORAL | Status: DC | PRN

## 2013-09-08 MED ORDER — IOHEXOL 300 MG/ML  SOLN
100.0000 mL | Freq: Once | INTRAMUSCULAR | Status: AC | PRN
  Administered 2013-09-08: 100 mL via INTRAVENOUS

## 2013-09-08 MED ORDER — HYDROMORPHONE HCL PF 1 MG/ML IJ SOLN
1.0000 mg | Freq: Once | INTRAMUSCULAR | Status: DC
  Filled 2013-09-08: qty 1

## 2013-09-08 MED ORDER — ONDANSETRON HCL 4 MG/2ML IJ SOLN
4.0000 mg | Freq: Once | INTRAMUSCULAR | Status: AC
  Administered 2013-09-08: 4 mg via INTRAVENOUS
  Filled 2013-09-08: qty 2

## 2013-09-08 MED ORDER — HYDROMORPHONE HCL PF 1 MG/ML IJ SOLN
1.0000 mg | Freq: Once | INTRAMUSCULAR | Status: AC
  Administered 2013-09-08: 1 mg via INTRAVENOUS

## 2013-09-08 MED ORDER — METHOCARBAMOL 500 MG PO TABS
500.0000 mg | ORAL_TABLET | Freq: Once | ORAL | Status: AC
  Administered 2013-09-08: 500 mg via ORAL
  Filled 2013-09-08: qty 1

## 2013-09-08 MED ORDER — MORPHINE SULFATE 4 MG/ML IJ SOLN
4.0000 mg | Freq: Once | INTRAMUSCULAR | Status: AC
  Administered 2013-09-08: 4 mg via INTRAVENOUS
  Filled 2013-09-08: qty 1

## 2013-09-08 MED ORDER — ONDANSETRON 4 MG PO TBDP
4.0000 mg | ORAL_TABLET | Freq: Once | ORAL | Status: AC
  Administered 2013-09-08: 4 mg via ORAL
  Filled 2013-09-08: qty 1

## 2013-09-08 MED ORDER — KETOROLAC TROMETHAMINE 60 MG/2ML IM SOLN
60.0000 mg | Freq: Once | INTRAMUSCULAR | Status: AC
  Administered 2013-09-08: 60 mg via INTRAMUSCULAR
  Filled 2013-09-08: qty 2

## 2013-09-08 NOTE — ED Provider Notes (Signed)
CSN: [id number]     Arrival date & time 09/07/13  2144 History   First MD Initiated Contact with Patient 09/08/13 0000     Chief Complaint  Patient presents with  . Flank Pain   (Consider location/radiation/quality/duration/timing/severity/associated sxs/prior Treatment) HPI Patient presents with gradual onset left sided lumbar pain that radiates around to her left abdomen. The pain is worse with movement and palpation. She states she is worked all day yesterday standing. She's had no vaginal bleeding or discharge. She denies any hematuria or dysuria. She does admit to urinary frequency for the past month. She's had no nausea vomiting or diarrhea. She's been seen and mild emergency department several times in the past month for abdominal pain thought to be related to endometriosis. She's had negative workups at that point. She is yet to followup with a gynecologist. Past Medical History  Diagnosis Date  . Endometriosis    Past Surgical History  Procedure Laterality Date  . Cholecystectomy     No family history on file. History  Substance Use Topics  . Smoking status: Former Smoker    Types: Cigarettes  . Smokeless tobacco: Not on file  . Alcohol Use: No     Comment: Former user   OB History   Grav Para Term Preterm Abortions TAB SAB Ect Mult Living                 Review of Systems  Constitutional: Negative for fever and chills.  Respiratory: Negative for cough and shortness of breath.   Cardiovascular: Negative for chest pain.  Gastrointestinal: Positive for abdominal pain. Negative for nausea, vomiting, diarrhea and constipation.  Genitourinary: Positive for frequency and flank pain. Negative for dysuria, hematuria, vaginal bleeding, vaginal discharge, difficulty urinating and pelvic pain.  Musculoskeletal: Positive for back pain and myalgias. Negative for neck pain and neck stiffness.  Skin: Negative for rash and wound.  Neurological: Negative for dizziness, weakness,  light-headedness, numbness and headaches.  All other systems reviewed and are negative.    Allergies  Amoxicillin; Ativan; and Betadine  Home Medications   Current Outpatient Rx  Name  Route  Sig  Dispense  Refill  . ondansetron (ZOFRAN) 4 MG tablet   Oral   Take 1 tablet (4 mg total) by mouth every 6 (six) hours.   12 tablet   0   . traMADol (ULTRAM) 50 MG tablet   Oral   Take 1 tablet (50 mg total) by mouth every 6 (six) hours as needed for moderate pain.   10 tablet   0    BP 122/75  Pulse 67  Temp(Src) 98.1 F (36.7 C) (Oral)  Resp 16  Ht 5\' 2"  (1.575 m)  Wt 115 lb 3 oz (52.249 kg)  BMI 21.06 kg/m2  SpO2 100%  LMP 08/22/2013 Physical Exam  Nursing note and vitals reviewed. Constitutional: She is oriented to person, place, and time. She appears well-developed and well-nourished. No distress.  HENT:  Head: Normocephalic and atraumatic.  Mouth/Throat: Oropharynx is clear and moist.  Eyes: EOM are normal. Pupils are equal, round, and reactive to light.  Neck: Normal range of motion. Neck supple.  Cardiovascular: Normal rate and regular rhythm.   Pulmonary/Chest: Effort normal and breath sounds normal. No respiratory distress. She has no wheezes. She has no rales. She exhibits no tenderness.  Abdominal: Soft. Bowel sounds are normal. She exhibits no distension and no mass. There is tenderness. There is no rebound and no guarding.  Mild diffuse abdominal tenderness  without focality. No rebound or guarding.  Genitourinary: Vaginal discharge (scant white vaginal d/c) found.  No CMT. Diffuse fundal and adnexal tenderness  Musculoskeletal: Normal range of motion. She exhibits tenderness. She exhibits no edema.  Patient with tenderness to palpation over her left lumbar paraspinal muscles. Just has mild tenderness to palpation up into the thoracic paraspinal muscles. She has no midline tenderness. Negative straight leg raise. No calf swelling or tenderness.  Neurological:  She is alert and oriented to person, place, and time.  Skin: Skin is warm and dry. No rash noted. No erythema.  Psychiatric: She has a normal mood and affect. Her behavior is normal.    ED Course  Procedures (including critical care time) Labs Review Labs Reviewed  URINALYSIS, ROUTINE W REFLEX MICROSCOPIC - Abnormal; Notable for the following:    Hgb urine dipstick TRACE (*)    Leukocytes, UA SMALL (*)    All other components within normal limits  URINE MICROSCOPIC-ADD ON - Abnormal; Notable for the following:    Squamous Epithelial / LPF FEW (*)    Bacteria, UA FEW (*)    All other components within normal limits  URINE CULTURE  CBC WITH DIFFERENTIAL  COMPREHENSIVE METABOLIC PANEL  POCT PREGNANCY, URINE   Imaging Review No results found.  EKG Interpretation   None       MDM  Patient's symptoms appear to be muscular in origin. She has a normal white blood cell count and urinalysis. I don't believe that further ultrasound or imaging is necessary at this point. We'll treat symptomatically and reevaluate  Patient with persistent abdominal and back pain. CT abdomen without acute findings. Patient admits having long history of chronic abdominal pain. She is advised to followup with OB/GYN. Return precautions given.  Loren Racer, MD 09/08/13 [phone]

## 2013-09-08 NOTE — ED Notes (Signed)
Pt sitting up in bed. No distress. Pain relieved.

## 2013-09-09 LAB — URINE CULTURE
Colony Count: NO GROWTH
Culture: NO GROWTH

## 2013-09-09 LAB — GC/CHLAMYDIA PROBE AMP: GC Probe RNA: NEGATIVE

## 2013-09-15 ENCOUNTER — Encounter (HOSPITAL_COMMUNITY): Payer: Self-pay | Admitting: Emergency Medicine

## 2013-09-15 ENCOUNTER — Emergency Department (INDEPENDENT_AMBULATORY_CARE_PROVIDER_SITE_OTHER)
Admission: EM | Admit: 2013-09-15 | Discharge: 2013-09-15 | Disposition: A | Discharge status: Home / Self Care | Payer: 59 | Source: Home / Self Care | Attending: Emergency Medicine | Admitting: Emergency Medicine

## 2013-09-15 DIAGNOSIS — N809 Endometriosis, unspecified: Secondary | ICD-10-CM

## 2013-09-15 MED ORDER — ONDANSETRON 4 MG PO TBDP: 4.0000 mg | ORAL_TABLET | Freq: Three times a day (TID) | ORAL | Status: DC | PRN

## 2013-09-15 MED ORDER — KETOROLAC TROMETHAMINE 30 MG/ML IJ SOLN
30.0000 mg | Freq: Once | INTRAMUSCULAR | Status: AC
  Administered 2013-09-15: 30 mg via INTRAMUSCULAR

## 2013-09-15 MED ORDER — TRAMADOL HCL 50 MG PO TABS: 50.0000 mg | ORAL_TABLET | Freq: Four times a day (QID) | ORAL | Status: DC | PRN

## 2013-09-15 MED ORDER — KETOROLAC TROMETHAMINE 30 MG/ML IJ SOLN
INTRAMUSCULAR | Status: AC
  Filled 2013-09-15: qty 1

## 2013-09-15 NOTE — ED Provider Notes (Signed)
Medical screening examination/treatment/procedure(s) were performed by a resident physician and as supervising physician I was immediately available for consultation/collaboration.  Additionally, I saw the patient independently, verified the history, examined the patient and discussed the treatment plan with the resident. Her symptoms are no different from what they had been in the past couple of months. She just needs something for pain control today. She has an appointment with gynecology. Symptoms are most consistent with endometriosis.  Leslee Home, M.D.   Reuben Likes, MD 09/15/13 2138

## 2013-09-15 NOTE — ED Provider Notes (Signed)
CSN: [id number]     Arrival date & time 09/15/13  1450 History   First MD Initiated Contact with Patient 09/15/13 1518     Chief Complaint  Patient presents with  . Abdominal Pain   HPI 22 yo female with h/o endometriosis who presents to urgent care for left flank and abdominal pain. She has been seen in the ED four times since September with similar complaints. She was last seen on 09/07/13 and had an extensive workup which included urine culture, abdominal, pelvic CT, wet prep, GC/Chl, urine pregnancy test which were all normal.   She reports pain in her left side, left lower quadrant and lower back. Describes pain as a sharp shooting pain that occurs throughout the day. It is worst with movement and better with laying down. She denies any fevers or chills. The pain has not changed in nature or frequency since her evaluation in the ED. She has been taking tramadol twice a day which helps some.  She denies any vaginal discharge, dysuria, increased urinary frequency. She had an episode of non bloody emesis this morning after eating. She has since then been able to keep fluids down. She also had a couple episodes of loose stools which she says are normal for her a few days before her period. LMP was 08/20/13.   Past Medical History  Diagnosis Date  . Endometriosis    Past Surgical History  Procedure Laterality Date  . Cholecystectomy     History reviewed. No pertinent family history. 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  History  Substance Use Topics  . Smoking status: Former Smoker    Types: Cigarettes  . Smokeless tobacco: Not on file  . Alcohol Use: No     Comment: Former user  Review of Systems Negative except per HPI Allergies  Ativan; Betadine; and Amoxicillin  Home Medications   Current Outpatient Rx  Name  Route  Sig  Dispense  Refill  . ondansetron (ZOFRAN) 4 MG  tablet   Oral   Take 1 tablet (4 mg total) by mouth every 6 (six) hours.   12 tablet   0   . ondansetron (ZOFRAN-ODT) 4 MG disintegrating tablet   Oral   Take 1 tablet (4 mg total) by mouth every 8 (eight) hours as needed for nausea or vomiting.   20 tablet   0   . traMADol (ULTRAM) 50 MG tablet   Oral   Take 1 tablet (50 mg total) by mouth every 6 (six) hours as needed for moderate pain.   10 tablet   0   . traMADol (ULTRAM) 50 MG tablet   Oral   Take 1 tablet (50 mg total) by mouth every 6 (six) hours as needed.   15 tablet   0   . traMADol (ULTRAM) 50 MG tablet   Oral   Take 1 tablet (50 mg total) by mouth every 6 (six) hours as needed.   30 tablet   0    BP 130/78  Pulse 80  Temp(Src) 99.5 F (37.5 C) (Oral)  Resp 18  SpO2 98%  LMP 08/22/2013 Physical Exam General: no acute distress, alert and oriented x4 Abdomen: present bowel sounds, tender to light touch in left upper and lower quadrants as well as left lower back, no rebound, no guarding ED Course  Procedures (including critical care time) Toradol 30mg  IM  x1   MDM   1. Endometriosis    - abdominal pain is unchanged since her evaluation in the ED 1 week ago. Will treat pain and nausea until she can be seen by a gynecologist for evaluation of pelvic pain likely caused by endometriosis. Rx for tramadol 50mg  q6/prn 30 tablets as well as toradol 30 IM in office. With report of nausea, will send Rx for zofran ODT.  Patient to follow up with gyn as soon as the next available appointment.   Marena Chancy, PGY-3 Family Medicine Resident     Lonia Skinner, MD 09/15/13 [phone]

## 2013-09-15 NOTE — ED Notes (Signed)
Pt  Reports   Pain in lower  abd  And  Both  Sides   Has  A  History  Of  Chronic  abd  Pain   With       History     Of  endometriosis    Pt      Reports      She  [facility]  [facility]   She  reports  As  Well  Vomiting /  Diarrhea      Since yesterday

## 2013-09-23 ENCOUNTER — Encounter (HOSPITAL_COMMUNITY): Payer: Self-pay | Admitting: Emergency Medicine

## 2013-09-23 ENCOUNTER — Emergency Department (INDEPENDENT_AMBULATORY_CARE_PROVIDER_SITE_OTHER)
Admission: EM | Admit: 2013-09-23 | Discharge: 2013-09-23 | Disposition: A | Discharge status: Home / Self Care | Payer: 59 | Source: Home / Self Care

## 2013-09-23 DIAGNOSIS — N809 Endometriosis, unspecified: Secondary | ICD-10-CM

## 2013-09-23 DIAGNOSIS — R1032 Left lower quadrant pain: Secondary | ICD-10-CM

## 2013-09-23 DIAGNOSIS — G8929 Other chronic pain: Secondary | ICD-10-CM

## 2013-09-23 DIAGNOSIS — R1031 Right lower quadrant pain: Secondary | ICD-10-CM

## 2013-09-23 MED ORDER — TRAMADOL HCL 50 MG PO TABS: 50.0000 mg | ORAL_TABLET | Freq: Four times a day (QID) | ORAL | Status: DC | PRN

## 2013-09-23 NOTE — ED Provider Notes (Signed)
CSN: [id number]     Arrival date & time 09/23/13  1521 History   First MD Initiated Contact with Patient 09/23/13 1556     Chief Complaint  Patient presents with  . Flank Pain   (Consider location/radiation/quality/duration/timing/severity/associated sxs/prior Treatment) HPI Comments: 22 year old female with a history of endometriosis presents today with bilateral abdominal pain worse in the lower quadrants and radiating to the bilateral flanks. She has been seen and evaluated in emergency department several times in the urgent care a couple weeks ago. Her evaluations have included abdominopelvic CT, abdominopelvic ultrasound, multiple and repeated labs, pelvic exams and testing for STDs. No abnormal or acute findings. Her symptoms are consistent with endometriosis. She states that she is taking tramadol and that does help considerably with her pain. Today she presents with request for birth control pills to help regulate her period it helped with the endometriosis pain. Her menses started 2 days ago and is currently having a flow. The pain was worse last p.m. and she had to leave work. She states that she has not been taking tramadol on a regular basis but has not been taking it frequent enough to control her pain at work.   Past Medical History  Diagnosis Date  . Endometriosis    Past Surgical History  Procedure Laterality Date  . Cholecystectomy     No family history on file. History  Substance Use Topics  . Smoking status: Former Smoker    Types: Cigarettes  . Smokeless tobacco: Not on file  . Alcohol Use: No     Comment: Former user   OB History   Grav Para Term Preterm Abortions TAB SAB Ect Mult Living                 Review of Systems  Constitutional: Positive for activity change. Negative for fever.  HENT: Negative.   Respiratory: Negative for cough, chest tightness, shortness of breath and wheezing.   Cardiovascular: Negative for chest pain, palpitations and leg  swelling.  Gastrointestinal: Positive for abdominal pain. Negative for vomiting, diarrhea, constipation, blood in stool and abdominal distention.  Genitourinary: Positive for pelvic pain. Negative for dysuria, urgency, frequency, flank pain, vaginal bleeding, vaginal discharge, difficulty urinating and menstrual problem.  Skin: Negative for pallor and rash.  Neurological: Negative.     Allergies  Ativan; Betadine; and Amoxicillin  Home Medications   Current Outpatient Rx  Name  Route  Sig  Dispense  Refill  . ondansetron (ZOFRAN) 4 MG tablet   Oral   Take 1 tablet (4 mg total) by mouth every 6 (six) hours.   12 tablet   0   . ondansetron (ZOFRAN-ODT) 4 MG disintegrating tablet   Oral   Take 1 tablet (4 mg total) by mouth every 8 (eight) hours as needed for nausea or vomiting.   20 tablet   0   . traMADol (ULTRAM) 50 MG tablet   Oral   Take 1 tablet (50 mg total) by mouth every 6 (six) hours as needed for moderate pain.   10 tablet   0   . traMADol (ULTRAM) 50 MG tablet   Oral   Take 1 tablet (50 mg total) by mouth every 6 (six) hours as needed.   15 tablet   0   . traMADol (ULTRAM) 50 MG tablet   Oral   Take 1 tablet (50 mg total) by mouth every 6 (six) hours as needed.   30 tablet   0   . traMADol (  ULTRAM) 50 MG tablet   Oral   Take 1 tablet (50 mg total) by mouth every 6 (six) hours as needed.   20 tablet   0    BP 133/79  Pulse 93  Temp(Src) 98.1 F (36.7 C) (Oral)  Resp 16  SpO2 100%  LMP 09/23/2013 Physical Exam  Nursing note and vitals reviewed. Constitutional: She is oriented to person, place, and time. She appears well-developed and well-nourished. No distress.  Eyes: Conjunctivae and EOM are normal.  Neck: Normal range of motion. Neck supple.  Cardiovascular: Normal rate, regular rhythm and normal heart sounds.   Pulmonary/Chest: Effort normal and breath sounds normal. No respiratory distress. She has no wheezes. She has no rales.  Abdominal:  Soft. She exhibits no mass. There is tenderness. There is guarding. There is no rebound.  Abdomen is a 10, scaphoid. There is tenderness across the pelvis and lower half of the abdomen. There is also tenderness in the left and right flanks and lateral most abdomen. This is the same pain for which she has been having since September. It is not new but becoming less tolerable unless she takes her Tramadol  Musculoskeletal: She exhibits no edema and no tenderness.  Lymphadenopathy:    She has no cervical adenopathy.  Neurological: She is alert and oriented to person, place, and time. She exhibits normal muscle tone.  Skin: Skin is warm and dry.  Psychiatric: She has a normal mood and affect.    ED Course  Procedures (including critical care time) Labs Review Labs Reviewed - No data to display Imaging Review No results found.    MDM   1. Abdominal pain, chronic, bilateral lower quadrant   2. Endometriosis      Tramadol is working well for her. She does not impress me as drug seeking and not asking for stronger meds.  I dont believe starting OCP's at this time and place is optimal and may be harmful without a more definitive dx and assurance of low risk for d ansplasia. Cont the Tramadol for now and keep your appointment with the [facility] 1st week of January.  Hayden Rasmussen, NP 09/23/13 1637  Hayden Rasmussen, NP 09/23/13 [phone]

## 2013-09-23 NOTE — ED Provider Notes (Signed)
Medical screening examination/treatment/procedure(s) were performed by resident physician or non-physician practitioner and as supervising physician I was immediately available for consultation/collaboration.   Gracia Saggese DOUGLAS MD.   Idalys Konecny D Ketrina Boateng, MD 09/23/13 1744 

## 2013-09-23 NOTE — ED Notes (Signed)
Pt c/o right flank pain onset 1 week... Seen here on 12/10 for similar sxs Hx of endometriosis ... [facility] She is taking tramadol w/some relief.... She is alert w/no signs of acute distress.

## 2013-10-02 ENCOUNTER — Encounter (HOSPITAL_COMMUNITY): Payer: Self-pay | Admitting: Emergency Medicine

## 2013-10-02 ENCOUNTER — Emergency Department (HOSPITAL_COMMUNITY)
Admission: EM | Admit: 2013-10-02 | Discharge: 2013-10-02 | Disposition: A | Discharge status: Home / Self Care | Payer: 59 | Source: Home / Self Care | Attending: Emergency Medicine | Admitting: Emergency Medicine

## 2013-10-02 DIAGNOSIS — J069 Acute upper respiratory infection, unspecified: Secondary | ICD-10-CM

## 2013-10-02 MED ORDER — TRAMADOL HCL 50 MG PO TABS: 100.0000 mg | ORAL_TABLET | Freq: Three times a day (TID) | ORAL | Status: DC | PRN

## 2013-10-02 MED ORDER — HYDROCOD POLST-CHLORPHEN POLST 10-8 MG/5ML PO LQCR: 5.0000 mL | Freq: Two times a day (BID) | ORAL | Status: DC | PRN

## 2013-10-02 NOTE — ED Provider Notes (Signed)
  Chief Complaint   Chief Complaint  Patient presents with  . URI    History of Present Illness   Jenna Blake is a 22 year old female who has had a two-day history of generalized aching, cough productive yellow-green sputum, chest pain, fatigue, subjective fever, myalgias, sore throat, nasal congestion which are green rhinorrhea, headache, sinus pressure, left ear pain, nausea, vomiting, and diarrhea. She has had no sick exposures.  Review of Systems   Other than noted above, the patient denies any of the following symptoms: Systemic:  No fevers, chills, sweats, weight loss or gain, fatigue, or tiredness. Eye:  No redness or discharge. ENT:  No ear pain, drainage, headache, nasal congestion, drainage, sinus pressure, difficulty swallowing, or sore throat. Neck:  No neck pain or swollen glands. Lungs:  No cough, sputum production, hemoptysis, wheezing, chest tightness, shortness of breath or chest pain. GI:  No abdominal pain, nausea, vomiting or diarrhea.  PMFSH   Past medical history, family history, social history, meds, and allergies were reviewed. She is allergic to Ativan, Betadine, and amoxicillin. She currently takes tramadol for endometriosis. She has a history of endometriosis, is status post gallbladder surgery, and has irritable bowel syndrome.  Physical exam   Vital signs:  BP 125/81  Pulse 92  Temp(Src) 98.5 F (36.9 C) (Oral)  Resp 20  SpO2 100%  LMP 09/23/2013 General:  Alert and oriented.  In no distress.  Skin warm and dry. Eye:  No conjunctival injection or drainage. Lids were normal. ENT:  TMs and canals were normal, without erythema or inflammation.  Nasal mucosa was clear and uncongested, without drainage.  Mucous membranes were moist.  Pharynx was clear with no exudate or drainage.  There were no oral ulcerations or lesions. Neck:  Supple, no adenopathy, tenderness or mass. Lungs:  No respiratory distress.  Lungs were clear to auscultation, without wheezes,  rales or rhonchi.  Breath sounds were clear and equal bilaterally.  Heart:  Regular rhythm, without gallops, murmers or rubs. Skin:  Clear, warm, and dry, without rash or lesions.  Assessment     The encounter diagnosis was Viral upper respiratory illness.  No indication for antibiotics.   Plan    1.  Meds:  The following meds were prescribed:   Discharge Medication List as of 10/02/2013  3:27 PM    START taking these medications   Details  chlorpheniramine-HYDROcodone (TUSSIONEX) 10-8 MG/5ML LQCR Take 5 mLs by mouth every 12 (twelve) hours as needed for cough., Starting 10/02/2013, Until Discontinued, Normal    !! traMADol (ULTRAM) 50 MG tablet Take 2 tablets (100 mg total) by mouth every 8 (eight) hours as needed., Starting 10/02/2013, Until Discontinued, Normal     !! - Potential duplicate medications found. Please discuss with provider.      2.  Patient Education/Counseling:  The patient was given appropriate handouts, self care instructions, and instructed in symptomatic relief.  Instructed to get extra fluids, rest, and use a cool mist vaporizer.   3.  Follow up:  The patient was told to follow up here if no better in 3 to 4 days, or sooner if becoming worse in any way, and given some red flag symptoms such as increasing fever, difficulty breathing, chest pain, or persistent vomiting which would prompt immediate return.  Follow up here as needed.      Reuben Likes, MD 10/02/13 [phone]

## 2013-10-02 NOTE — ED Notes (Signed)
Uri: cough, headache, general aches,  sore throat, both ears hurt and left ear stuffier than right ear.  Reports vomiting x 2 days.  Vomited 3 times today.  Attempted to eat bread/crackers and gatorade

## 2013-10-14 ENCOUNTER — Encounter: Payer: Self-pay | Admitting: Advanced Practice Midwife

## 2013-10-14 ENCOUNTER — Ambulatory Visit (INDEPENDENT_AMBULATORY_CARE_PROVIDER_SITE_OTHER): Payer: 59 | Admitting: Advanced Practice Midwife

## 2013-10-14 VITALS — BP 129/74 | HR 110 | Temp 98.7°F | Ht 62.0 in | Wt 110.1 lb

## 2013-10-14 DIAGNOSIS — R102 Pelvic and perineal pain: Principal | ICD-10-CM

## 2013-10-14 DIAGNOSIS — N809 Endometriosis, unspecified: Secondary | ICD-10-CM

## 2013-10-14 DIAGNOSIS — G8929 Other chronic pain: Secondary | ICD-10-CM | POA: Insufficient documentation

## 2013-10-14 DIAGNOSIS — N949 Unspecified condition associated with female genital organs and menstrual cycle: Secondary | ICD-10-CM

## 2013-10-14 LAB — HM PAP SMEAR

## 2013-10-14 MED ORDER — LEVONORGEST-ETH ESTRAD 91-DAY 0.15-0.03 MG PO TABS: 1.0000 | ORAL_TABLET | Freq: Every day | ORAL | Status: DC

## 2013-10-14 MED ORDER — TRAMADOL HCL 50 MG PO TABS: 50.0000 mg | ORAL_TABLET | Freq: Four times a day (QID) | ORAL | Status: DC | PRN

## 2013-10-14 NOTE — Patient Instructions (Addendum)
Leuprolide depot injection or implant What is this medicine? LEUPROLIDE (loo PROE lide) is a man-made protein that acts like a natural hormone in the body. It decreases testosterone in men and decreases estrogen in women. In men, this medicine is used to treat advanced prostate cancer. In women, some forms of this medicine may be used to treat endometriosis, uterine fibroids, or other female hormone-related problems. This medicine may be used for other purposes; ask your health care provider or pharmacist if you have questions. COMMON BRAND NAME(S): Eligard, Lupron Depot, Lupron Depot-Ped, Viadur What should I tell my health care provider before I take this medicine? They need to know if you have any of these conditions: -diabetes -heart disease or previous heart attack -high blood pressure -high cholesterol -osteoporosis -pain or difficulty passing urine -spinal cord metastasis -stroke -tobacco smoker -unusual vaginal bleeding (women) -an unusual or allergic reaction to leuprolide, benzyl alcohol, other medicines, foods, dyes, or preservatives -pregnant or trying to get pregnant -breast-feeding How should I use this medicine? This medicine is for injection into a muscle or for implant or injection under the skin. It is given by a health care professional in a hospital or clinic setting. The specific product will determine how it will be given to you. Make sure you understand which product you receive and how often you will receive it. Talk to your pediatrician regarding the use of this medicine in children. Special care may be needed. Overdosage: If you think you have taken too much of this medicine contact a poison control center or emergency room at once. NOTE: This medicine is only for you. Do not share this medicine with others. What if I miss a dose? It is important not to miss a dose. Call your doctor or health care professional if you are unable to keep an appointment. Depot  injections: Depot injections are given either once-monthly, every 12 weeks, every 16 weeks, or every 24 weeks depending on the product you are prescribed. The product you are prescribed will be based on if you are female or female, and your condition. Make sure you understand your product and dosing. Implant dosing: The implant is removed and replaced once a year. The implant is only used in males. What may interact with this medicine? Do not take this medicine with any of the following medications: -chasteberry This medicine may also interact with the following medications: -herbal or dietary supplements, like black cohosh or DHEA -female hormones, like estrogens or progestins and birth control pills, patches, rings, or injections -female hormones, like testosterone This list may not describe all possible interactions. Give your health care provider a list of all the medicines, herbs, non-prescription drugs, or dietary supplements you use. Also tell them if you smoke, drink alcohol, or use illegal drugs. Some items may interact with your medicine. What should I watch for while using this medicine? Visit your doctor or health care professional for regular checks on your progress. During the first weeks of treatment, your symptoms may get worse, but then will improve as you continue your treatment. You may get hot flashes, increased bone pain, increased difficulty passing urine, or an aggravation of nerve symptoms. Discuss these effects with your doctor or health care professional, some of them may improve with continued use of this medicine. Female patients may experience a menstrual cycle or spotting during the first months of therapy with this medicine. If this continues, contact your doctor or health care professional. What side effects may I notice from  receiving this medicine? Side effects that you should report to your doctor or health care professional as soon as possible: -allergic reactions like  skin rash, itching or hives, swelling of the face, lips, or tongue -breathing problems -chest pain -depression or memory disorders -pain in your legs or groin -pain at site where injected or implanted -severe headache -swelling of the feet and legs -visual changes -vomiting Side effects that usually do not require medical attention (report to your doctor or health care professional if they continue or are bothersome): -breast swelling or tenderness -decrease in sex drive or performance -diarrhea -hot flashes -loss of appetite -muscle, joint, or bone pains -nausea -redness or irritation at site where injected or implanted -skin problems or acne This list may not describe all possible side effects. Call your doctor for medical advice about side effects. You may report side effects to FDA at 1-800-FDA-1088. Where should I keep my medicine? This drug is given in a hospital or clinic and will not be stored at home. NOTE: This sheet is a summary. It may not cover all possible information. If you have questions about this medicine, talk to your doctor, pharmacist, or health care provider.  2014, Elsevier/Gold Standard. (2010-03-27 14:41:21)  Medroxyprogesterone injection [Contraceptive] What is this medicine? MEDROXYPROGESTERONE (me DROX ee proe JES te rone) contraceptive injections prevent pregnancy. They provide effective birth control for 3 months. Depo-subQ Provera 104 is also used for treating pain related to endometriosis. This medicine may be used for other purposes; ask your health care provider or pharmacist if you have questions. COMMON BRAND NAME(S): Depo-Provera, Depo-subQ Provera 104 What should I tell my health care provider before I take this medicine? They need to know if you have any of these conditions: -frequently drink alcohol -asthma -blood vessel disease or a history of a blood clot in the lungs or legs -bone disease such as osteoporosis -breast  cancer -diabetes -eating disorder (anorexia nervosa or bulimia) -high blood pressure -HIV infection or AIDS -kidney disease -liver disease -mental depression -migraine -seizures (convulsions) -stroke -tobacco smoker -vaginal bleeding -an unusual or allergic reaction to medroxyprogesterone, other hormones, medicines, foods, dyes, or preservatives -pregnant or trying to get pregnant -breast-feeding How should I use this medicine? Depo-Provera Contraceptive injection is given into a muscle. Depo-subQ Provera 104 injection is given under the skin. These injections are given by a health care professional. You must not be pregnant before getting an injection. The injection is usually given during the first 5 days after the start of a menstrual period or 6 weeks after delivery of a baby. Talk to your pediatrician regarding the use of this medicine in children. Special care may be needed. These injections have been used in female children who have started having menstrual periods. Overdosage: If you think you have taken too much of this medicine contact a poison control center or emergency room at once. NOTE: This medicine is only for you. Do not share this medicine with others. What if I miss a dose? Try not to miss a dose. You must get an injection once every 3 months to maintain birth control. If you cannot keep an appointment, call and reschedule it. If you wait longer than 13 weeks between Depo-Provera contraceptive injections or longer than 14 weeks between Depo-subQ Provera 104 injections, you could get pregnant. Use another method for birth control if you miss your appointment. You may also need a pregnancy test before receiving another injection. What may interact with this medicine? Do not take  this medicine with any of the following medications: -bosentan This medicine may also interact with the following medications: -aminoglutethimide -antibiotics or medicines for infections,  especially rifampin, rifabutin, rifapentine, and griseofulvin -aprepitant -barbiturate medicines such as phenobarbital or primidone -bexarotene -carbamazepine -medicines for seizures like ethotoin, felbamate, oxcarbazepine, phenytoin, topiramate -modafinil -St. John's wort This list may not describe all possible interactions. Give your health care provider a list of all the medicines, herbs, non-prescription drugs, or dietary supplements you use. Also tell them if you smoke, drink alcohol, or use illegal drugs. Some items may interact with your medicine. What should I watch for while using this medicine? This drug does not protect you against HIV infection (AIDS) or other sexually transmitted diseases. Use of this product may cause you to lose calcium from your bones. Loss of calcium may cause weak bones (osteoporosis). Only use this product for more than 2 years if other forms of birth control are not right for you. The longer you use this product for birth control the more likely you will be at risk for weak bones. Ask your health care professional how you can keep strong bones. You may have a change in bleeding pattern or irregular periods. Many females stop having periods while taking this drug. If you have received your injections on time, your chance of being pregnant is very low. If you think you may be pregnant, see your health care professional as soon as possible. Tell your health care professional if you want to get pregnant within the next year. The effect of this medicine may last a long time after you get your last injection. What side effects may I notice from receiving this medicine? Side effects that you should report to your doctor or health care professional as soon as possible: -allergic reactions like skin rash, itching or hives, swelling of the face, lips, or tongue -breast tenderness or discharge -breathing problems -changes in vision -depression -feeling faint or  lightheaded, falls -fever -pain in the abdomen, chest, groin, or leg -problems with balance, talking, walking -unusually weak or tired -yellowing of the eyes or skin Side effects that usually do not require medical attention (report to your doctor or health care professional if they continue or are bothersome): -acne -fluid retention and swelling -headache -irregular periods, spotting, or absent periods -temporary pain, itching, or skin reaction at site where injected -weight gain This list may not describe all possible side effects. Call your doctor for medical advice about side effects. You may report side effects to FDA at 1-800-FDA-1088. Where should I keep my medicine? This does not apply. The injection will be given to you by a health care professional. NOTE: This sheet is a summary. It may not cover all possible information. If you have questions about this medicine, talk to your doctor, pharmacist, or health care provider.  2014, Elsevier/Gold Standard. (2008-10-14 18:37:56)

## 2013-10-14 NOTE — Progress Notes (Signed)
  Subjective:     Jenna Blake is a 23 y.o. female who presents for evaluation of abdominal pain. The pain is described as aching and cramping, and is 10/10 in intensity. Pain is located in the RLQ, LLQ and deep pelvis area without radiation. Onset was gradual occurring years ago Symptoms have been about the same since. Aggravating factors: menses. Alleviating factors: Tramadol. Associated symptoms: none. The patient denies chills, headache, nausea and vomiting. Risk factors for pelvic/abdominal pain include endometriosis and Confirmed Stage I by surgery in 2012.  [place] in [place].  Was offered Lupron and declined. Used OCPs with some relief, but stopped taking them. Has been having unprotected IC.  Menstrual History: OB History   Grav Para Term Preterm Abortions TAB SAB Ect Mult Living   0 0 0 0 0 0 0 0 0 0       Menarche age: [age]  Patient's last menstrual period was 09/26/2013.    The following portions of the patient's history were reviewed and updated as appropriate: allergies, current medications, past family history, past medical history, past social history, past surgical history and problem list.   Review of Systems Pertinent items are noted in HPI.    Objective:    BP 129/74  Pulse 110  Temp(Src) 98.7 F (37.1 C) (Oral)  Ht 5\' 2"  (1.575 m)  Wt 49.941 kg (110 lb 1.6 oz)  BMI 20.13 kg/m2  LMP 09/26/2013 General:   alert, cooperative and no distress  Lungs:   clear to auscultation bilaterally  Heart:   regular rate and rhythm, S1, S2 normal, no murmur, click, rub or gallop  Abdomen:  soft, non-tender; bowel sounds normal; no masses,  no organomegaly  CVA:   absent  Pelvis:  Vulva and vagina appear normal. Bimanual exam reveals normal uterus and adnexa. except for tenderness throughout  Extremities:   extremities normal, atraumatic, no cyanosis or edema  Neurologic:   negative  Psychiatric:   normal mood, behavior, speech, dress, and thought processes   Lab  Review Labs: none   Imaging Has had recent CT  Scan, negative       Assessment:    Functional: endometriosis    Plan:    The diagnosis was discussed with the patient and evaluation and treatment plans outlined. Follow up in 3 months or as needed. Discussed options of Lupron with addback, DepoProvera and OCPS  States she wants to talk to her father before deciding. She likes the idea of DepoProvera.  Will Rx [place] OCPs for now. Advised to start them now, for contraception and relief of pain Refilled Tramadol #30 with one refill Continue ibuprofen

## 2013-10-21 ENCOUNTER — Encounter: Payer: Self-pay | Admitting: *Deleted

## 2013-10-26 ENCOUNTER — Encounter (HOSPITAL_COMMUNITY): Payer: Self-pay | Admitting: Emergency Medicine

## 2013-10-26 ENCOUNTER — Emergency Department (HOSPITAL_COMMUNITY)
Admission: EM | Admit: 2013-10-26 | Discharge: 2013-10-27 | Disposition: A | Discharge status: Home / Self Care | Payer: 59 | Attending: Emergency Medicine | Admitting: Emergency Medicine

## 2013-10-26 DIAGNOSIS — Z87442 Personal history of urinary calculi: Secondary | ICD-10-CM | POA: Insufficient documentation

## 2013-10-26 DIAGNOSIS — Z9089 Acquired absence of other organs: Secondary | ICD-10-CM | POA: Insufficient documentation

## 2013-10-26 DIAGNOSIS — R1110 Vomiting, unspecified: Secondary | ICD-10-CM

## 2013-10-26 DIAGNOSIS — Z87891 Personal history of nicotine dependence: Secondary | ICD-10-CM | POA: Insufficient documentation

## 2013-10-26 DIAGNOSIS — Z79899 Other long term (current) drug therapy: Secondary | ICD-10-CM | POA: Insufficient documentation

## 2013-10-26 DIAGNOSIS — Z3202 Encounter for pregnancy test, result negative: Secondary | ICD-10-CM | POA: Insufficient documentation

## 2013-10-26 DIAGNOSIS — N949 Unspecified condition associated with female genital organs and menstrual cycle: Secondary | ICD-10-CM | POA: Insufficient documentation

## 2013-10-26 DIAGNOSIS — Z8742 Personal history of other diseases of the female genital tract: Secondary | ICD-10-CM | POA: Insufficient documentation

## 2013-10-26 DIAGNOSIS — R112 Nausea with vomiting, unspecified: Secondary | ICD-10-CM | POA: Insufficient documentation

## 2013-10-26 DIAGNOSIS — R630 Anorexia: Secondary | ICD-10-CM | POA: Insufficient documentation

## 2013-10-26 DIAGNOSIS — Z9889 Other specified postprocedural states: Secondary | ICD-10-CM | POA: Insufficient documentation

## 2013-10-26 DIAGNOSIS — F1110 Opioid abuse, uncomplicated: Secondary | ICD-10-CM | POA: Insufficient documentation

## 2013-10-26 DIAGNOSIS — R109 Unspecified abdominal pain: Secondary | ICD-10-CM | POA: Insufficient documentation

## 2013-10-26 DIAGNOSIS — Z791 Long term (current) use of non-steroidal anti-inflammatories (NSAID): Secondary | ICD-10-CM | POA: Insufficient documentation

## 2013-10-26 DIAGNOSIS — Z880 Allergy status to penicillin: Secondary | ICD-10-CM | POA: Insufficient documentation

## 2013-10-26 LAB — COMPREHENSIVE METABOLIC PANEL
ALBUMIN: 4.7 g/dL (ref 3.5–5.2)
ALK PHOS: 68 U/L (ref 39–117)
ALT: 8 U/L (ref 0–35)
AST: 18 U/L (ref 0–37)
BILIRUBIN TOTAL: 0.4 mg/dL (ref 0.3–1.2)
BUN: 10 mg/dL (ref 6–23)
CHLORIDE: 103 meq/L (ref 96–112)
CO2: 25 meq/L (ref 19–32)
CREATININE: 0.59 mg/dL (ref 0.50–1.10)
Calcium: 8.9 mg/dL (ref 8.4–10.5)
GFR calc Af Amer: >90 mL/min (ref >90)
Glucose, Bld: 100 mg/dL — ABNORMAL HIGH (ref 70–99)
POTASSIUM: 3.8 meq/L (ref 3.7–5.3)
Sodium: 142 mEq/L (ref 137–147)
Total Protein: 7 g/dL (ref 6.0–8.3)

## 2013-10-26 LAB — CBC WITH DIFFERENTIAL/PLATELET
BASOS PCT: 0 % (ref 0–1)
Basophils Absolute: 0 10*3/uL (ref 0.0–0.1)
Eosinophils Absolute: 0.1 10*3/uL (ref 0.0–0.7)
Eosinophils Relative: 1 % (ref 0–5)
HEMATOCRIT: 37.6 % (ref 36.0–46.0)
Hemoglobin: 13.6 g/dL (ref 12.0–15.0)
Lymphocytes Relative: 53 % — ABNORMAL HIGH (ref 12–46)
Lymphs Abs: 2.8 10*3/uL (ref 0.7–4.0)
MCH: 33.3 pg (ref 26.0–34.0)
MCHC: 36.2 g/dL — AB (ref 30.0–36.0)
MCV: 92.2 fL (ref 78.0–100.0)
MONO ABS: 0.5 10*3/uL (ref 0.1–1.0)
Monocytes Relative: 9 % (ref 3–12)
NEUTROS ABS: 1.9 10*3/uL (ref 1.7–7.7)
NEUTROS PCT: 37 % — AB (ref 43–77)
Platelets: 207 10*3/uL (ref 150–400)
RBC: 4.08 MIL/uL (ref 3.87–5.11)
RDW: 11.6 % (ref 11.5–15.5)
WBC: 5.3 10*3/uL (ref 4.0–10.5)

## 2013-10-26 LAB — LIPASE, BLOOD: LIPASE: 18 U/L (ref 11–59)

## 2013-10-26 MED ORDER — FENTANYL CITRATE 0.05 MG/ML IJ SOLN
50.0000 ug | Freq: Once | INTRAMUSCULAR | Status: AC
  Administered 2013-10-26: 50 ug via INTRAVENOUS
  Filled 2013-10-26: qty 2

## 2013-10-26 MED ORDER — ONDANSETRON 4 MG PO TBDP: 8.0000 mg | ORAL_TABLET | Freq: Once | ORAL | Status: DC

## 2013-10-26 MED ORDER — ONDANSETRON HCL 4 MG/2ML IJ SOLN
4.0000 mg | Freq: Once | INTRAMUSCULAR | Status: AC
Start: 2013-10-26 — End: 2013-10-26
  Administered 2013-10-26: 4 mg via INTRAVENOUS
  Filled 2013-10-26: qty 2

## 2013-10-26 NOTE — ED Notes (Addendum)
Pt report RLQ pain that began yesterday evening progressively, admits to n/v/d and unsure of any fever. Pt w/ hx cholecystectomy and endometriosis. Pt is crying, guarding and appears to be in acute distress d/t her pain.

## 2013-10-27 ENCOUNTER — Encounter (HOSPITAL_COMMUNITY): Payer: Self-pay | Admitting: Emergency Medicine

## 2013-10-27 DIAGNOSIS — R1031 Right lower quadrant pain: Secondary | ICD-10-CM | POA: Insufficient documentation

## 2013-10-27 DIAGNOSIS — Z79899 Other long term (current) drug therapy: Secondary | ICD-10-CM | POA: Insufficient documentation

## 2013-10-27 DIAGNOSIS — N809 Endometriosis, unspecified: Secondary | ICD-10-CM | POA: Insufficient documentation

## 2013-10-27 DIAGNOSIS — Z9089 Acquired absence of other organs: Secondary | ICD-10-CM | POA: Insufficient documentation

## 2013-10-27 DIAGNOSIS — G8929 Other chronic pain: Secondary | ICD-10-CM | POA: Insufficient documentation

## 2013-10-27 DIAGNOSIS — Z791 Long term (current) use of non-steroidal anti-inflammatories (NSAID): Secondary | ICD-10-CM | POA: Insufficient documentation

## 2013-10-27 DIAGNOSIS — Z87891 Personal history of nicotine dependence: Secondary | ICD-10-CM | POA: Insufficient documentation

## 2013-10-27 DIAGNOSIS — Z9889 Other specified postprocedural states: Secondary | ICD-10-CM | POA: Insufficient documentation

## 2013-10-27 LAB — BASIC METABOLIC PANEL
BUN: 13 mg/dL (ref 6–23)
CHLORIDE: 109 meq/L (ref 96–112)
CO2: 26 mEq/L (ref 19–32)
CREATININE: 0.65 mg/dL (ref 0.50–1.10)
Calcium: 8.7 mg/dL (ref 8.4–10.5)
GFR calc Af Amer: >90 mL/min (ref >90)
GFR calc non Af Amer: >90 mL/min (ref >90)
GLUCOSE: 93 mg/dL (ref 70–99)
Potassium: 4.3 mEq/L (ref 3.7–5.3)
Sodium: 145 mEq/L (ref 137–147)

## 2013-10-27 LAB — URINALYSIS, ROUTINE W REFLEX MICROSCOPIC
BILIRUBIN URINE: NEGATIVE
Glucose, UA: NEGATIVE mg/dL
Hgb urine dipstick: NEGATIVE
KETONES UR: NEGATIVE mg/dL
Leukocytes, UA: NEGATIVE
Nitrite: NEGATIVE
PH: 7 (ref 5.0–8.0)
PROTEIN: NEGATIVE mg/dL
Specific Gravity, Urine: 1.03 (ref 1.005–1.030)
Urobilinogen, UA: 1 mg/dL (ref 0.0–1.0)

## 2013-10-27 LAB — POCT I-STAT, CHEM 8
BUN: 13 mg/dL (ref 6–23)
CALCIUM ION: 1.25 mmol/L — AB (ref 1.12–1.23)
CHLORIDE: 106 meq/L (ref 96–112)
CREATININE: 0.8 mg/dL (ref 0.50–1.10)
GLUCOSE: 91 mg/dL (ref 70–99)
HCT: 37 % (ref 36.0–46.0)
Hemoglobin: 12.6 g/dL (ref 12.0–15.0)
Potassium: 4.2 mEq/L (ref 3.7–5.3)
Sodium: 144 mEq/L (ref 137–147)
TCO2: 26 mmol/L (ref 0–100)

## 2013-10-27 LAB — POCT PREGNANCY, URINE: Preg Test, Ur: NEGATIVE

## 2013-10-27 MED ORDER — KETOROLAC TROMETHAMINE 30 MG/ML IJ SOLN
30.0000 mg | Freq: Once | INTRAMUSCULAR | Status: AC
  Administered 2013-10-27: 30 mg via INTRAVENOUS
  Filled 2013-10-27: qty 1

## 2013-10-27 MED ORDER — NAPROXEN 500 MG PO TABS: 500.0000 mg | ORAL_TABLET | Freq: Two times a day (BID) | ORAL | Status: DC

## 2013-10-27 MED ORDER — SODIUM CHLORIDE 0.9 % IV BOLUS (SEPSIS)
1000.0000 mL | Freq: Once | INTRAVENOUS | Status: AC
  Administered 2013-10-27: 1000 mL via INTRAVENOUS

## 2013-10-27 MED ORDER — HYDROMORPHONE HCL PF 1 MG/ML IJ SOLN
1.0000 mg | Freq: Once | INTRAMUSCULAR | Status: AC
  Administered 2013-10-27: 1 mg via INTRAVENOUS
  Filled 2013-10-27: qty 1

## 2013-10-27 MED ORDER — ONDANSETRON 4 MG PO TBDP
ORAL_TABLET | ORAL | Status: DC
Start: 2013-10-27 — End: 2013-10-27

## 2013-10-27 MED ORDER — PROMETHAZINE HCL 25 MG/ML IJ SOLN
12.5000 mg | Freq: Once | INTRAMUSCULAR | Status: AC
  Administered 2013-10-27: 12.5 mg via INTRAVENOUS
  Filled 2013-10-27: qty 1

## 2013-10-27 NOTE — ED Notes (Signed)
Pt reminded we need a urine sample.  

## 2013-10-27 NOTE — ED Notes (Signed)
Pt attempting to urinate.

## 2013-10-27 NOTE — ED Notes (Signed)
Pt very tearful, MD informed.

## 2013-10-27 NOTE — ED Notes (Signed)
Pt asked could she obtain a urine sample. Pt stated that she couldn't at this time. Will follow up shortly. 

## 2013-10-27 NOTE — ED Notes (Signed)
Will discharge pt 30 min after [facility] adm[facility] MD and pt came to an agreement of one dose of a narcotic. Pt currently enrolled in outpatient tx for narcotic addiction.

## 2013-10-27 NOTE — ED Provider Notes (Signed)
CSN: [id number]     Arrival date & time 10/26/13  2240 History   First MD Initiated Contact with Patient 10/27/13 0000     Chief Complaint  Patient presents with  . Abdominal Pain   (Consider location/radiation/quality/duration/timing/severity/associated sxs/prior Treatment) HPI Comments: 23 yo female with chronic pelvic pain, endometriosis, narcotic abuse hx presents with right flank pain similar to previous.  Pt has had kidney stones.  Pain intermittent.  No vaginal sxs.  No fevers.  Nothing improves.  Pt is only allowed tramadol for pain at home.    Patient is a 23 y.o. female presenting with abdominal pain. The history is provided by the patient.  Abdominal Pain Associated symptoms: nausea and vomiting   Associated symptoms: no chest pain, no chills, no dysuria, no fever, no shortness of breath, no vaginal bleeding and no vaginal discharge     Past Medical History  Diagnosis Date  . Endometriosis    Past Surgical History  Procedure Laterality Date  . Cholecystectomy    . Tonsillectomy    . Laparoscopic endometriosis fulguration     History reviewed. No pertinent family history. History  Substance Use Topics  . Smoking status: Former Smoker -- 0.50 packs/day    Types: Cigarettes  . Smokeless tobacco: Not on file  . Alcohol Use: No     Comment: Former user   OB History   Grav Para Term Preterm Abortions TAB SAB Ect Mult Living   0 0 0 0 0 0 0 0 0 0      Review of Systems  Constitutional: Positive for appetite change. Negative for fever and chills.  Respiratory: Negative for shortness of breath.   Cardiovascular: Negative for chest pain.  Gastrointestinal: Positive for nausea, vomiting and abdominal pain.  Genitourinary: Negative for dysuria, flank pain, vaginal bleeding, vaginal discharge and vaginal pain.  Musculoskeletal: Negative for back pain and neck stiffness.  Skin: Negative for rash.  Neurological: Negative for light-headedness and headaches.    Allergies   Ativan; Betadine; Reglan; and Amoxicillin  Home Medications   Current Outpatient Rx  Name  Route  Sig  Dispense  Refill  . levonorgestrel-ethinyl estradiol (SEASONALE) 0.15-0.03 MG tablet   Oral   Take 1 tablet by mouth daily.   1 Package   11   . naproxen sodium (ANAPROX) 220 MG tablet   Oral   Take 440 mg by mouth every 6 (six) hours as needed (pain).         . naproxen (NAPROSYN) 500 MG tablet   Oral   Take 1 tablet (500 mg total) by mouth 2 (two) times daily.   20 tablet   0   . ondansetron (ZOFRAN ODT) 4 MG disintegrating tablet      4mg  ODT q4 hours prn nausea/vomit   4 tablet   0    BP 106/55  Pulse 89  Temp(Src) 98.3 F (36.8 C) (Oral)  Resp 22  SpO2 100%  LMP 09/19/2013 Physical Exam  Nursing note and vitals reviewed. Constitutional: She is oriented to person, place, and time. She appears well-developed and well-nourished.  HENT:  Head: Normocephalic and atraumatic.  Mild dry mm  Eyes: Conjunctivae are normal. Right eye exhibits no discharge. Left eye exhibits no discharge.  Neck: Normal range of motion. Neck supple. No tracheal deviation present.  Cardiovascular: Normal rate and regular rhythm.   Pulmonary/Chest: Effort normal and breath sounds normal.  Abdominal: Soft. She exhibits no distension. There is tenderness (right flank, upper). There is no guarding.  Musculoskeletal: She exhibits tenderness. She exhibits no edema.  Neurological: She is alert and oriented to person, place, and time.  Skin: Skin is warm. No rash noted.  Psychiatric: She has a normal mood and affect.    ED Course  Procedures (including critical care time) Emergency Focused Ultrasound Exam Limited retroperitoneal ultrasound of kidneys  Performed and interpreted by Dr. Jodi MourningZavitz Indication: flank pain right Focused abdominal ultrasound with both kidneys imaged in transverse and longitudinal planes in real-time. Interpretation: No hydronephrosis visualized.  Nostones or  cysts visualized  Images archived electronically  Labs Review Labs Reviewed  CBC WITH DIFFERENTIAL - Abnormal; Notable for the following:    MCHC 36.2 (*)    Neutrophils Relative % 37 (*)    Lymphocytes Relative 53 (*)    All other components within normal limits  COMPREHENSIVE METABOLIC PANEL - Abnormal; Notable for the following:    Glucose, Bld 100 (*)    All other components within normal limits  URINALYSIS, ROUTINE W REFLEX MICROSCOPIC - Abnormal; Notable for the following:    APPearance HAZY (*)    All other components within normal limits  LIPASE, BLOOD  POCT PREGNANCY, URINE   Imaging Review No results found.  EKG Interpretation   None       MDM   1. Right flank pain   2. Vomiting    Similar to previous.  Bedside US no hydronephrosis. Pain meds given, pt improved.  I do not feel she requires narcotics for home or a CT in ED.  Fup outpt discussed.  Pain improved on recheck.   Results and differential diagnosis were discussed with the patient. Close follow up outpatient was discussed, patient comfortable with the plan.        Enid SkeensJoshua M Keymari Sato, MD 10/27/13 [phone]

## 2013-10-27 NOTE — ED Notes (Signed)
PT asked for tramadol prescription instead of naproxen, MD informed. Unable to fulfill pt's request. Pt informed. Pt upset.

## 2013-10-27 NOTE — ED Notes (Signed)
Rechecked BP in left arm, pt's BP 127/61. Pt very tearful and moving around, difficult to obtain BP.

## 2013-10-27 NOTE — ED Notes (Signed)
Pt nauseous and gagging, MD informed. See new orders.

## 2013-10-27 NOTE — ED Notes (Signed)
Pt states she was here yesterday being seen for right sided lower abdominal pain, and that today her pain is worse. N/V/D x 5

## 2013-10-27 NOTE — Discharge Instructions (Signed)
Follow up with primary doctor and GI for worsening symptoms. If you were given medicines take as directed.  If you are on coumadin or contraceptives realize their levels and effectiveness is altered by many different medicines.  If you have any reaction (rash, tongues swelling, other) to the medicines stop taking and see a physician.   Please follow up as directed and return to the ER or see a physician for new or worsening symptoms.  Thank you.  Abdominal Pain, Adult Many things can cause abdominal pain. Usually, abdominal pain is not caused by a disease and will improve without treatment. It can often be observed and treated at home. Your health care provider will do a physical exam and possibly order blood tests and X-rays to help determine the seriousness of your pain. However, in many cases, more time must pass before a clear cause of the pain can be found. Before that point, your health care provider may not know if you need more testing or further treatment. HOME CARE INSTRUCTIONS  Monitor your abdominal pain for any changes. The following actions may help to alleviate any discomfort you are experiencing:  Only take over-the-counter or prescription medicines as directed by your health care provider.  Do not take laxatives unless directed to do so by your health care provider.  Try a clear liquid diet (broth, tea, or water) as directed by your health care provider. Slowly move to a bland diet as tolerated. SEEK MEDICAL CARE IF:  You have unexplained abdominal pain.  You have abdominal pain associated with nausea or diarrhea.  You have pain when you urinate or have a bowel movement.  You experience abdominal pain that wakes you in the night.  You have abdominal pain that is worsened or improved by eating food.  You have abdominal pain that is worsened with eating fatty foods. SEEK IMMEDIATE MEDICAL CARE IF:   Your pain does not go away within 2 hours.  You have a fever.  You  keep throwing up (vomiting).  Your pain is felt only in portions of the abdomen, such as the right side or the left lower portion of the abdomen.  You pass bloody or black tarry stools. MAKE SURE YOU:  Understand these instructions.   Will watch your condition.   Will get help right away if you are not doing well or get worse.  Document Released: 07/03/2005 Document Revised: 07/14/2013 Document Reviewed: 06/02/2013 [facility] Patient Information 2014 [employer] [place].

## 2013-10-28 ENCOUNTER — Emergency Department (HOSPITAL_COMMUNITY)
Admission: EM | Admit: 2013-10-28 | Discharge: 2013-10-28 | Disposition: A | Discharge status: Home / Self Care | Payer: 59 | Attending: Emergency Medicine | Admitting: Emergency Medicine

## 2013-10-28 DIAGNOSIS — R109 Unspecified abdominal pain: Secondary | ICD-10-CM

## 2013-10-28 DIAGNOSIS — G8929 Other chronic pain: Secondary | ICD-10-CM

## 2013-10-28 DIAGNOSIS — N809 Endometriosis, unspecified: Secondary | ICD-10-CM

## 2013-10-28 MED ORDER — ONDANSETRON HCL 4 MG/2ML IJ SOLN
4.0000 mg | Freq: Once | INTRAMUSCULAR | Status: AC
  Administered 2013-10-28: 4 mg via INTRAVENOUS
  Filled 2013-10-28: qty 2

## 2013-10-28 MED ORDER — TRAMADOL HCL 50 MG PO TABS: 50.0000 mg | ORAL_TABLET | Freq: Four times a day (QID) | ORAL | Status: DC | PRN

## 2013-10-28 MED ORDER — HYDROMORPHONE HCL PF 1 MG/ML IJ SOLN
1.0000 mg | Freq: Once | INTRAMUSCULAR | Status: AC
  Administered 2013-10-28: 1 mg via INTRAVENOUS
  Filled 2013-10-28: qty 1

## 2013-10-28 NOTE — ED Notes (Signed)
Dr. Lavella LemonsManly in to see patient.

## 2013-10-28 NOTE — ED Provider Notes (Signed)
CSN: [id number]     Arrival date & time 10/27/13  2002 History   First MD Initiated Contact with Patient 10/28/13 0206     Chief Complaint  Patient presents with  . Abdominal Pain   (Consider location/radiation/quality/duration/timing/severity/associated sxs/prior Treatment) HPI This patient is a pleasant young lady with an 8 year history of chronic lower abdominal pain which, she says, is secondary to endometriosis. She is here with complaints of right lower quadrant abdominal pain. She says this is similar to her previous episodes of abdominal pain. She requests a dose of Dilaudid. She says, once well for her home. She has an upcoming appointment with the gynecologist.  Past Medical History  Diagnosis Date  . Endometriosis    Past Surgical History  Procedure Laterality Date  . Cholecystectomy    . Tonsillectomy    . Laparoscopic endometriosis fulguration     History reviewed. No pertinent family history. History  Substance Use Topics  . Smoking status: Former Smoker -- 0.50 packs/day    Types: Cigarettes  . Smokeless tobacco: Never Used  . Alcohol Use: No     Comment: Former user   OB History   Grav Para Term Preterm Abortions TAB SAB Ect Mult Living   0 0 0 0 0 0 0 0 0 0      Review of Systems Ten point review of symptoms performed and is negative with the exception of symptoms noted above.   Allergies  Ativan; Betadine; Reglan; and Amoxicillin  Home Medications   Current Outpatient Rx  Name  Route  Sig  Dispense  Refill  . levonorgestrel-ethinyl estradiol (SEASONALE) 0.15-0.03 MG tablet   Oral   Take 1 tablet by mouth daily.   1 Package   11   . naproxen (NAPROSYN) 500 MG tablet   Oral   Take 1 tablet (500 mg total) by mouth 2 (two) times daily.   20 tablet   0   . traMADol (ULTRAM) 50 MG tablet   Oral   Take 1 tablet (50 mg total) by mouth every 6 (six) hours as needed.   30 tablet   0    BP 111/64  Pulse 66  Temp(Src) 98.6 F (37 C) (Oral)   Resp 16  SpO2 100%  LMP 09/19/2013 Physical Exam Gen: well developed and well nourished appearing Head: NCAT Eyes: PERL, EOMI Nose: no epistaixis or rhinorrhea Mouth/throat: mucosa is moist and pink Neck: supple, no stridor Lungs: CTA B, no wheezing, rhonchi or rales CV: RRR, no murmur, extremities appear well perfused.  Abd: soft, tender over the rlq, nondistended Back: no ttp, no cva ttp Skin: warm and dry Ext: normal to inspection, no dependent edema Neuro: CN ii-xii grossly intact, no focal deficits Psyche; normal affect,  calm and cooperative. .   ED Course  Procedures (including critical care time) Labs Review Results for orders placed during the hospital encounter of 10/28/13 (from the past 24 hour(s))  BASIC METABOLIC PANEL     Status: None   Collection Time    10/27/13  8:52 PM      Result Value Range   Sodium 145  137 - 147 mEq/L   Potassium 4.3  3.7 - 5.3 mEq/L   Chloride 109  96 - 112 mEq/L   CO2 26  19 - 32 mEq/L   Glucose, Bld 93  70 - 99 mg/dL   BUN 13  6 - 23 mg/dL   Creatinine, Ser 4.090.65  0.50 - 1.10 mg/dL   Calcium  8.7  8.4 - 10.5 mg/dL   GFR calc non Af Amer >90  >90 mL/min   GFR calc Af Amer >90  >90 mL/min  POCT I-STAT, CHEM 8     Status: Abnormal   Collection Time    10/27/13  9:03 PM      Result Value Range   Sodium 144  137 - 147 mEq/L   Potassium 4.2  3.7 - 5.3 mEq/L   Chloride 106  96 - 112 mEq/L   BUN 13  6 - 23 mg/dL   Creatinine, Ser 1.61  0.50 - 1.10 mg/dL   Glucose, Bld 91  70 - 99 mg/dL   Calcium, Ion 0.96 (*) 1.12 - 1.23 mmol/L   TCO2 26  0 - 100 mmol/L   Hemoglobin 12.6  12.0 - 15.0 g/dL   HCT 04.5  40.9 - 81.1 %    MDM   1. Chronic abdominal pain   2. Endometriosis       Brandt Loosen, MD 10/28/13 [phone]

## 2013-11-03 ENCOUNTER — Ambulatory Visit: Payer: 59 | Admitting: Physician Assistant

## 2013-11-03 ENCOUNTER — Encounter: Payer: Self-pay | Admitting: Family Medicine

## 2013-11-03 ENCOUNTER — Ambulatory Visit (INDEPENDENT_AMBULATORY_CARE_PROVIDER_SITE_OTHER): Payer: 59 | Admitting: Family Medicine

## 2013-11-03 ENCOUNTER — Telehealth: Payer: Self-pay | Admitting: *Deleted

## 2013-11-03 VITALS — BP 120/80 | HR 109 | Temp 98.4°F | Resp 16 | Ht 64.0 in | Wt 105.5 lb

## 2013-11-03 DIAGNOSIS — F341 Dysthymic disorder: Secondary | ICD-10-CM

## 2013-11-03 DIAGNOSIS — N949 Unspecified condition associated with female genital organs and menstrual cycle: Secondary | ICD-10-CM

## 2013-11-03 DIAGNOSIS — J329 Chronic sinusitis, unspecified: Secondary | ICD-10-CM | POA: Insufficient documentation

## 2013-11-03 DIAGNOSIS — G8929 Other chronic pain: Secondary | ICD-10-CM

## 2013-11-03 DIAGNOSIS — R102 Pelvic and perineal pain: Principal | ICD-10-CM

## 2013-11-03 DIAGNOSIS — F418 Other specified anxiety disorders: Secondary | ICD-10-CM | POA: Insufficient documentation

## 2013-11-03 MED ORDER — SULFAMETHOXAZOLE-TMP DS 800-160 MG PO TABS: 1.0000 | ORAL_TABLET | Freq: Two times a day (BID) | ORAL | Status: DC

## 2013-11-03 MED ORDER — VORTIOXETINE HBR 10 MG PO TABS: 1.0000 | ORAL_TABLET | Freq: Every day | ORAL | Status: DC

## 2013-11-03 NOTE — Assessment & Plan Note (Signed)
New to provider, ongoing for pt.  Following w/ GYN for endometriosis.  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  Pt asked for pain medication today in office but told her she would need to contact her GYN as they are her treating physician for this issue.

## 2013-11-03 NOTE — Patient Instructions (Signed)
Follow up in 4-6 weeks to recheck mood Start the Brintellix daily- 2 weeks at 5 mg (the samples given) and then start the 10mg  (the prescription) Call and schedule an appt with a psychiatrist for ongoing management of your depression Start the Bactrim twice daily- take w/ food- for the sinuses Start Delsym (OTC) for cough Drink plenty of fluids Hang in there!!!

## 2013-11-03 NOTE — Progress Notes (Signed)
   Subjective:    Patient ID: Jenna Blake, female    DOB: 1991-01-06, 23 y.o.   MRN: [id number]  HPI Depression/anxiety- hx of this.  1st diagnosed w/ depression at age 23.  Has been to therapy.  On multiple medications previously- Cymbalta, Pristiq, Celexa.  [place] in [place].  Chronic abd pain- reports she has 'nerve damage' from previous mono infection due to splenic enlargement (but pain is R sided).  Was also told she has endometriosis which can cause her pain.  Has already had 1 surgery which improved pain temporarily.  Pain has again worsened- seeing GYN.  Has been on Lyrica.  Cough- sxs started 4 days ago.  Dry.  No fevers.  Bilateral ear fullness, nasal congestion.  + sinus pressure.   Review of Systems For ROS see HPI     Objective:   Physical Exam  Vitals reviewed. Constitutional: She is oriented to person, place, and time. She appears well-developed and well-nourished. No distress.  HENT:  Head: Normocephalic and atraumatic.  Right Ear: Tympanic membrane normal.  Left Ear: Tympanic membrane normal.  Nose: Mucosal edema and rhinorrhea present. Right sinus exhibits maxillary sinus tenderness and frontal sinus tenderness. Left sinus exhibits maxillary sinus tenderness and frontal sinus tenderness.  Mouth/Throat: Uvula is midline and mucous membranes are normal. Posterior oropharyngeal erythema present. No oropharyngeal exudate.  Eyes: Conjunctivae and EOM are normal. Pupils are equal, round, and reactive to light.  Neck: Normal range of motion. Neck supple.  Cardiovascular: Normal rate, regular rhythm and normal heart sounds.   Pulmonary/Chest: Effort normal and breath sounds normal. No respiratory distress. She has no wheezes.  Nearly continuous dry cough  Abdominal: Soft. Bowel sounds are normal. She exhibits no distension. There is no tenderness. There is no rebound.  Lymphadenopathy:    She has no cervical adenopathy.  Neurological: She is alert and  oriented to person, place, and time. No cranial nerve deficit. Coordination normal.  Skin: Skin is warm and dry.  Psychiatric: She has a normal mood and affect. Her behavior is normal.          Assessment & Plan:

## 2013-11-03 NOTE — Assessment & Plan Note (Signed)
New to provider, chronic for pt.  Pt reports multiple failures of SNRIs, SSRIs.  Start Brintellix.  Names and #s of psychiatrists provided.  Will follow.

## 2013-11-03 NOTE — Telephone Encounter (Addendum)
Patient would like pain medicine until she is able to be seen for her next appt.     Pt called back and left additional message stating that she forgot to mention in her earlier message that she wants to discuss having surgery for her endometriosis. She states that it has gotten worse and she keeps having to go to the emergency room because of the pain.  I returned her call and discussed her concerns and requests. I stated that the doctor will discuss surgery as well as other treatment options with her at her scheduled visit on 11/15/13.  Per chart review, pt has had numerous prescriptions for Tramadol over the last 2 months- most recently she was given # 30 on 10/28/13 form visit to [facility].  I asked what she is currently taking for pain.  She stated Tramadol and naproxen. I asked if she has run out of the Tramadol and she said "No I have plenty left because I just got a new prescription after going to the ER and also had some left from the last one." I told pt that our records show that she was requesting additional pain medication in her first call. She said "no I just don't know what to do when the pain gets so bad that the medication doesn't work. I can't take it when I work and then by the time I get home the pain is really bad and it takes awhile for me to calm down." I told pt that she should continue taking naproxen twice daily because this will help her pain to not get so strong. Then she should take the Tramadol as soon as she is able after work if the pain has increased. Any other medication that could be prescribed for pain will also make her sleepy and would not be done without an evaluation by our doctors. I advised pt to keep appt as scheduled on 2/9 and if she has problems (severe pain) before that time, she should go to [facility] for evaluation instead of the Urgent Care or ED's. Pt voiced understanding. Diane Day RNC

## 2013-11-03 NOTE — Assessment & Plan Note (Signed)
New.  Pt's sxs and PE consistent w/ infxn.  Start abx.  Reviewed supportive care and red flags that should prompt return.  Pt expressed understanding and is in agreement w/ plan.  

## 2013-11-03 NOTE — Progress Notes (Signed)
Pre visit review using our clinic review tool, if applicable. No additional management support is needed unless otherwise documented below in the visit note. 

## 2013-11-05 ENCOUNTER — Telehealth: Payer: Self-pay | Admitting: *Deleted

## 2013-11-05 MED ORDER — PROMETHAZINE-DM 6.25-15 MG/5ML PO SYRP: ORAL_SOLUTION | ORAL | Status: DC

## 2013-11-05 NOTE — Telephone Encounter (Signed)
PA started for Brintellix. Awaiting response. JG//CMA

## 2013-11-05 NOTE — Telephone Encounter (Signed)
Patient called and stated that her cough is worse. Please advise. JG//CMA

## 2013-11-05 NOTE — Telephone Encounter (Signed)
Med filled.  

## 2013-11-05 NOTE — Telephone Encounter (Signed)
Ok for Promethazine/Dextromethorphan 1-2 tsps Q4-6 prn cough.  #240, no refills.  May cause drowsiness

## 2013-11-08 ENCOUNTER — Telehealth: Payer: Self-pay

## 2013-11-08 NOTE — Telephone Encounter (Signed)
Pt. Called stating she is still having pain from her endometriosis and she cant take it any longer. States she is unable to get an earlier appointment ( appointment 11/15/13) and would like to know what she can do and if she needs to go to the emergency room to get pain medication. Spoke with Dr. Macon LargeAnyanwu who states pt. Should continue to take Tramadol and Naproxen as well as her birth control pill as we cannot prescribe anything stronger without seeing her.   Called pt. And informed her of this; pt. Verbalizes understanding. Advised pt to use heating pads, warm baths, and relaxation. If pain is severe go to [facility]. Pt. States she is trying to keep herself out of the ER if possible. States she is almost out of Tramadol and will be out before appointment as she has had to take it daily; is requesting refill. Told pt. I would speak to a doctor tomorrow as they are gone for the day and get back to her. Pt. Verbalizes understanding.

## 2013-11-10 MED ORDER — TRAMADOL HCL 50 MG PO TABS: 50.0000 mg | ORAL_TABLET | Freq: Four times a day (QID) | ORAL | Status: DC | PRN

## 2013-11-10 NOTE — Telephone Encounter (Signed)
Called and pt informed me that she has been seen here on January 8th for pelvic pain and wants to get a refill on her tramadol.  Per Dr. Reola CalkinsBeck, patient can have a refill on tramadol #15 to hold her until her appt for February 9th in which she will get reevaluated for her pain.  Pt stated understanding.

## 2013-11-10 NOTE — Telephone Encounter (Signed)
PA denied. Form forwarded to Dr. Beverely Lowabori for review. JG//CMA

## 2013-11-12 ENCOUNTER — Ambulatory Visit: Payer: 59 | Admitting: Physician Assistant

## 2013-11-15 ENCOUNTER — Encounter: Payer: Self-pay | Admitting: Obstetrics & Gynecology

## 2013-11-15 ENCOUNTER — Ambulatory Visit (INDEPENDENT_AMBULATORY_CARE_PROVIDER_SITE_OTHER): Payer: 59 | Admitting: Obstetrics & Gynecology

## 2013-11-15 VITALS — BP 112/66 | HR 98 | Temp 97.8°F | Ht 64.0 in | Wt 106.6 lb

## 2013-11-15 DIAGNOSIS — N949 Unspecified condition associated with female genital organs and menstrual cycle: Secondary | ICD-10-CM

## 2013-11-15 DIAGNOSIS — R102 Pelvic and perineal pain: Principal | ICD-10-CM

## 2013-11-15 DIAGNOSIS — G8929 Other chronic pain: Secondary | ICD-10-CM

## 2013-11-15 MED ORDER — GABAPENTIN 600 MG PO TABS: 600.0000 mg | ORAL_TABLET | Freq: Every day | ORAL | Status: DC

## 2013-11-15 MED ORDER — TRAMADOL HCL 50 MG PO TABS: 50.0000 mg | ORAL_TABLET | Freq: Four times a day (QID) | ORAL | Status: DC | PRN

## 2013-11-15 NOTE — Progress Notes (Signed)
Patient ID: Jenna Blake, female   DOB: 07-19-1991, 23 y.o.   MRN: [id number]  Chief Complaint  Patient presents with  . Follow-up    HPI Jenna SnareMiranda Blake is a 23 y.o. female.  G0P0000 Patient's last menstrual period was 10/20/2013. Has not had menses on OCP yet, still with pain.   HPI  Past Medical History  Diagnosis Date  . Endometriosis   . Depression     Diagnosed at age 23    Past Surgical History  Procedure Laterality Date  . Cholecystectomy    . Tonsillectomy    . Laparoscopic endometriosis fulguration      Family History  Problem Relation Age of Onset  . Hypertension Father   . Diabetes Maternal Grandmother     Social History History  Substance Use Topics  . Smoking status: Former Smoker -- 0.50 packs/day    Types: Cigarettes  . Smokeless tobacco: Never Used  . Alcohol Use: No     Comment: Former user    Allergies  Allergen Reactions  . Ativan [Lorazepam] Other (See Comments)    Makes her feel crazy  . Betadine [Povidone Iodine] Hives  . Reglan [Metoclopramide] Other (See Comments)    "bad thoughts, makes me feel weird."  . Amoxicillin Rash    Current Outpatient Prescriptions  Medication Sig Dispense Refill  . levonorgestrel-ethinyl estradiol (SEASONALE) 0.15-0.03 MG tablet Take 1 tablet by mouth daily.  1 Package  11  . naproxen (NAPROSYN) 500 MG tablet Take 1 tablet (500 mg total) by mouth 2 (two) times daily.  20 tablet  0  . sulfamethoxazole-trimethoprim (BACTRIM DS) 800-160 MG per tablet Take 1 tablet by mouth 2 (two) times daily.  20 tablet  0  . traMADol (ULTRAM) 50 MG tablet Take 1 tablet (50 mg total) by mouth every 6 (six) hours as needed.  15 tablet  0  . Vortioxetine HBr (BRINTELLIX) 10 MG TABS Take 1 tablet (10 mg total) by mouth daily.  30 tablet  3  . gabapentin (NEURONTIN) 600 MG tablet Take 1 tablet (600 mg total) by mouth at bedtime.  30 tablet  3  . promethazine-dextromethorphan (PROMETHAZINE-DM) 6.25-15 MG/5ML syrup Take 1-2 Tsp every  4-6 hours as needed for cough.  240 mL  0   No current facility-administered medications for this visit.    Review of Systems Review of Systems  Constitutional: Negative for fever.  Genitourinary: Positive for menstrual problem and pelvic pain. Negative for vaginal discharge.    Blood pressure 112/66, pulse 98, temperature 97.8 F (36.6 C), height 5\' 4"  (1.626 m), weight 106 lb 9.6 oz (48.353 kg), last menstrual period 10/20/2013.  Physical Exam Physical Exam  Constitutional: She appears well-developed. No distress.  Pulmonary/Chest: Effort normal. No respiratory distress.  Skin: Skin is warm and dry.  Psychiatric: She has a normal mood and affect. Her behavior is normal.    Data Reviewed Office notes  Assessment    Pelvic pain, endometriosis     Plan    Continue OCP, Tramadol, add Gabapentin 600 mg HS. RTC 6 weeks        Elvis Laufer 11/15/2013, 3:21 PM

## 2013-11-16 NOTE — Telephone Encounter (Signed)
Information submitted to insurance company stating that patient states that she has failed on multiple SSRI's. Awaiting response. JG//CMA

## 2013-11-22 ENCOUNTER — Telehealth: Payer: Self-pay | Admitting: *Deleted

## 2013-11-22 ENCOUNTER — Other Ambulatory Visit: Payer: Self-pay | Admitting: Obstetrics & Gynecology

## 2013-11-22 ENCOUNTER — Encounter: Payer: Self-pay | Admitting: *Deleted

## 2013-11-22 DIAGNOSIS — R102 Pelvic and perineal pain: Secondary | ICD-10-CM

## 2013-11-22 NOTE — Telephone Encounter (Signed)
Called Jenna Blake to let her know Dr. Debroah LoopArnold had approved request from pharmacy and to try to make these last until next appt. She states she has been having a lot of pain and taking aleve without relief and thinking about coming to hospital. I advised if her pain was so severe , not relieved by aleve  Or tramadol to [facility] for [facility].

## 2013-11-22 NOTE — Telephone Encounter (Signed)
Jenna Blake called and left a message she wants a refill of her tramadol.

## 2013-11-22 NOTE — Telephone Encounter (Signed)
Discussed patient request with Dr. Debroah LoopArnold- due to history frequent refills- will run refill report and reevaluate.  Report ran- Dr. Debroah LoopArnold had also received refill request from pharmacy which he had approved before report ready. Report shows patient has [facility] tram[facility], [facility], 4 times in December at [facility], Urgent care, 4 times in [place] [facility] [place].  Once in Feb with Dr. Reola CalkinsBeck

## 2013-11-23 NOTE — Telephone Encounter (Signed)
Jenna Blake called to say her prescription is not at the pharmacy.  Prescription can not be eprescribed.  Called in to pharmacy this am and notified Trevia. She states she did not come to hospital because she had to work, just used [employer].

## 2013-11-28 ENCOUNTER — Encounter (HOSPITAL_COMMUNITY): Payer: Self-pay | Admitting: *Deleted

## 2013-11-28 ENCOUNTER — Inpatient Hospital Stay (HOSPITAL_COMMUNITY)
Admission: AD | Admit: 2013-11-28 | Discharge: 2013-11-28 | Disposition: A | Discharge status: Home / Self Care | Payer: 59 | Source: Ambulatory Visit | Attending: Obstetrics & Gynecology | Admitting: Obstetrics & Gynecology

## 2013-11-28 DIAGNOSIS — Z87891 Personal history of nicotine dependence: Secondary | ICD-10-CM | POA: Insufficient documentation

## 2013-11-28 DIAGNOSIS — R109 Unspecified abdominal pain: Secondary | ICD-10-CM | POA: Insufficient documentation

## 2013-11-28 DIAGNOSIS — R102 Pelvic and perineal pain: Secondary | ICD-10-CM

## 2013-11-28 DIAGNOSIS — G8929 Other chronic pain: Secondary | ICD-10-CM

## 2013-11-28 DIAGNOSIS — N949 Unspecified condition associated with female genital organs and menstrual cycle: Secondary | ICD-10-CM | POA: Insufficient documentation

## 2013-11-28 DIAGNOSIS — N809 Endometriosis, unspecified: Secondary | ICD-10-CM | POA: Insufficient documentation

## 2013-11-28 LAB — CBC
HCT: 34.9 % — ABNORMAL LOW (ref 36.0–46.0)
Hemoglobin: 12.4 g/dL (ref 12.0–15.0)
MCH: 31.9 pg (ref 26.0–34.0)
MCHC: 35.5 g/dL (ref 30.0–36.0)
MCV: 89.7 fL (ref 78.0–100.0)
Platelets: 198 10*3/uL (ref 150–400)
RBC: 3.89 MIL/uL (ref 3.87–5.11)
RDW: 12.2 % (ref 11.5–15.5)
WBC: 5.2 10*3/uL (ref 4.0–10.5)

## 2013-11-28 LAB — URINE MICROSCOPIC-ADD ON

## 2013-11-28 LAB — POCT PREGNANCY, URINE: Preg Test, Ur: NEGATIVE

## 2013-11-28 LAB — WET PREP, GENITAL
CLUE CELLS WET PREP: NONE SEEN
TRICH WET PREP: NONE SEEN
YEAST WET PREP: NONE SEEN

## 2013-11-28 LAB — URINALYSIS, ROUTINE W REFLEX MICROSCOPIC
Bilirubin Urine: NEGATIVE
GLUCOSE, UA: NEGATIVE mg/dL
Ketones, ur: NEGATIVE mg/dL
NITRITE: NEGATIVE
Protein, ur: NEGATIVE mg/dL
SPECIFIC GRAVITY, URINE: 1.02 (ref 1.005–1.030)
Urobilinogen, UA: 0.2 mg/dL (ref 0.0–1.0)
pH: 7.5 (ref 5.0–8.0)

## 2013-11-28 MED ORDER — KETOROLAC TROMETHAMINE 10 MG PO TABS: 10.0000 mg | ORAL_TABLET | Freq: Four times a day (QID) | ORAL | Status: DC | PRN

## 2013-11-28 MED ORDER — KETOROLAC TROMETHAMINE 60 MG/2ML IM SOLN
60.0000 mg | Freq: Once | INTRAMUSCULAR | Status: AC
  Administered 2013-11-28: 60 mg via INTRAMUSCULAR
  Filled 2013-11-28: qty 2

## 2013-11-28 NOTE — [facility] Note (Signed)
Pt presents with complaints of pain in her right side. She says the pain started yesterday.

## 2013-11-28 NOTE — Discharge Instructions (Signed)
Endometriosis Endometriosis is a condition in which the tissue that lines the uterus (endometrium) grows outside of its normal location. The tissue may grow in many locations close to the uterus, but it commonly grows on the ovaries, fallopian tubes, vagina, or bowel. Because the uterus expels, or sheds, its lining every menstrual cycle, there is bleeding wherever the endometrial tissue is located. This can cause pain because blood is irritating to tissues not normally exposed to it.  CAUSES  The cause of endometriosis is not known.  SIGNS AND SYMPTOMS  Often, there are no symptoms. When symptoms are present, they can vary with the location of the displaced tissue. Various symptoms can occur at different times. Although symptoms occur mainly during a woman's menstrual period, they can also occur midcycle and usually stop with menopause. Some people may go months with no symptoms at all. Symptoms may include:   Back or abdominal pain.   Heavier bleeding during periods.   Pain during intercourse.   Painful bowel movements.   Infertility. DIAGNOSIS  Your health care provider will do a physical exam and ask about your symptoms. Various tests may be done, such as:   Blood tests and urine tests. These are done to help rule out other problems.   Ultrasound. This test is done to look for abnormal tissue.   An X-ray of the lower bowel (barium enema).  Laparoscopy. In this procedure, a thin, lighted tube with a tiny camera on the end (laparoscope) is inserted into your abdomen. This helps your health care provider look for abnormal tissue to confirm the diagnosis. The health care provider may also remove a small piece of tissue (biopsy) from any abnormal tissue found. This tissue sample can then be sent to a lab so it can be looked at under a microscope. TREATMENT  Treatment will vary and may include:   Medicines to relieve pain. Nonsteroidal anti-inflammatory drugs (NSAIDs) are a type of  pain medicine that can help to relieve the pain caused by endometriosis.  Hormonal therapy. When using hormonal therapy, periods are eliminated. This eliminates the monthly exposure to blood by the displaced endometrial tissue.   Surgery. Surgery may sometimes be done to remove the abnormal endometrial tissue. In severe cases, surgery may be done to remove the fallopian tubes, uterus, and ovaries (hysterectomy). HOME CARE INSTRUCTIONS   Only take over-the-counter or prescription medicines for pain, discomfort, or fever as directed by your health care provider. Do not take aspirin because it may increase bleeding when you are not on hormonal therapy.   Avoid activities that produce pain, including sexual activity. SEEK MEDICAL CARE IF:  You have pelvic pain before, after, or during your periods.  You have pelvic pain between periods that gets worse during your period.  You have pelvic pain during or after sex.  You have pelvic pain with bowel movements or urination, especially during your period.  You have problems getting pregnant. SEEK IMMEDIATE MEDICAL CARE IF:   Your pain is severe and is not responding to pain medicine.   You have severe nausea and vomiting, or you cannot keep foods down.   You have pain that is limited to the right lower part of your abdomen.   You have swelling or increasing pain in your abdomen.   You see blood in your stool.   You have a fever or persistent symptoms for more than 2 3 days.   You have a fever and your symptoms suddenly get worse. MAKE SURE YOU:     Understand these instructions.  Will watch your condition.  Will get help right away if you are not doing well or get worse. Document Released: 09/20/2000 Document Revised: 07/14/2013 Document Reviewed: 05/21/2013 [employer] Patient Information 2014 [employer].  

## 2013-11-28 NOTE — MAU Provider Note (Signed)
History     CSN: [id number]  Arrival date and time: 11/28/13 1801   First Provider Initiated Contact with Patient 11/28/13 1910      Chief Complaint  Patient presents with  . right side pain    HPI  Ms. Jenna Blake is a 23 y.o. female G0P0000 who presents with worsening abdominal pain. Pt has a history of chronic abdominal pain due to endometriosis and chronic narcotic use.  Pt called the office at the clinic on 2/16 and requested a prescription for tramadol; prescription was authorized for refill.  According to the [place] drug database patient has been prescribed several prescriptions for tramadol from various ED Dr's. Pt was told from other ED's that she needed to come to [facility] for care.  Pt complains of right lower quadrant pain which is consistant with the type of pain she has experienced for years.  Pt currently rates her pain 6-7.   Pt has a follow up appointment scheduled for the clinic and thinks it may even be tomorrow.    OB History   Grav Para Term Preterm Abortions TAB SAB Ect Mult Living   0 0 0 0 0 0 0 0 0 0       Past Medical History  Diagnosis Date  . Endometriosis   . Depression     Diagnosed at age 80    Past Surgical History  Procedure Laterality Date  . Cholecystectomy    . Tonsillectomy    . Laparoscopic endometriosis fulguration      Family History  Problem Relation Age of Onset  . Hypertension Father   . Diabetes Maternal Grandmother     History  Substance Use Topics  . Smoking status: Former Smoker -- 0.50 packs/day    Types: Cigarettes  . Smokeless tobacco: Never Used  . Alcohol Use: No     Comment: Former user    Allergies:  Allergies  Allergen Reactions  . Ativan [Lorazepam] Other (See Comments)    Makes her feel crazy  . Betadine [Povidone Iodine] Hives  . Reglan [Metoclopramide] Other (See Comments)    "bad thoughts, makes me feel weird."  . Amoxicillin Rash    Prescriptions prior to admission  Medication Sig  Dispense Refill  . gabapentin (NEURONTIN) 600 MG tablet Take 1 tablet (600 mg total) by mouth at bedtime.  30 tablet  3  . levonorgestrel-ethinyl estradiol (SEASONALE) 0.15-0.03 MG tablet Take 1 tablet by mouth daily.  1 Package  11  . naproxen (NAPROSYN) 500 MG tablet Take 1 tablet (500 mg total) by mouth 2 (two) times daily.  20 tablet  0  . promethazine-dextromethorphan (PROMETHAZINE-DM) 6.25-15 MG/5ML syrup Take 1-2 Tsp every 4-6 hours as needed for cough.  240 mL  0  . sulfamethoxazole-trimethoprim (BACTRIM DS) 800-160 MG per tablet Take 1 tablet by mouth 2 (two) times daily.  20 tablet  0  . traMADol (ULTRAM) 50 MG tablet TAKE 1 TABLET EVERY 6 HOURS AS NEEDED FOR PAIN  15 tablet  0  . Vortioxetine HBr (BRINTELLIX) 10 MG TABS Take 1 tablet (10 mg total) by mouth daily.  30 tablet  3   Results for orders placed during the hospital encounter of 11/28/13 (from the past 48 hour(s))  URINALYSIS, ROUTINE W REFLEX MICROSCOPIC     Status: Abnormal   Collection Time    11/28/13  6:15 PM      Result Value Ref Range   Color, Urine YELLOW  YELLOW   APPearance HAZY (*) CLEAR  Specific Gravity, Urine 1.020  1.005 - 1.030   pH 7.5  5.0 - 8.0   Glucose, UA NEGATIVE  NEGATIVE mg/dL   Hgb urine dipstick TRACE (*) NEGATIVE   Bilirubin Urine NEGATIVE  NEGATIVE   Ketones, ur NEGATIVE  NEGATIVE mg/dL   Protein, ur NEGATIVE  NEGATIVE mg/dL   Urobilinogen, UA 0.2  0.0 - 1.0 mg/dL   Nitrite NEGATIVE  NEGATIVE   Leukocytes, UA TRACE (*) NEGATIVE  URINE MICROSCOPIC-ADD ON     Status: Abnormal   Collection Time    11/28/13  6:15 PM      Result Value Ref Range   Squamous Epithelial / LPF FEW (*) RARE   WBC, UA 3-6  <3 WBC/hpf   RBC / HPF 0-2  <3 RBC/hpf   Bacteria, UA FEW (*) RARE  POCT PREGNANCY, URINE     Status: None   Collection Time    11/28/13  6:43 PM      Result Value Ref Range   Preg Test, Ur NEGATIVE  NEGATIVE   Comment:            THE SENSITIVITY OF THIS     METHODOLOGY IS >24 mIU/mL   CBC     Status: Abnormal   Collection Time    11/28/13  7:30 PM      Result Value Ref Range   WBC 5.2  4.0 - 10.5 K/uL   RBC 3.89  3.87 - 5.11 MIL/uL   Hemoglobin 12.4  12.0 - 15.0 g/dL   HCT 16.1 (*) 09.6 - 04.5 %   MCV 89.7  78.0 - 100.0 fL   MCH 31.9  26.0 - 34.0 pg   MCHC 35.5  30.0 - 36.0 g/dL   RDW 40.9  81.1 - 91.4 %   Platelets 198  150 - 400 K/uL    Review of Systems  Constitutional: Negative for fever and chills.  Gastrointestinal: Positive for nausea, vomiting, abdominal pain (+ right lower quadrant pain ) and diarrhea. Negative for constipation.  Genitourinary: Negative for dysuria, urgency, frequency and hematuria.       No vaginal discharge. + vaginal bleeding; pt is currently on her menstrual cycle  No dysuria.    Physical Exam   Blood pressure 122/61, pulse 70, temperature 98.2 F (36.8 C), temperature source Oral, resp. rate 18, last menstrual period 11/28/2013.  Physical Exam  Constitutional: She is oriented to person, place, and time. She appears well-developed and well-nourished. No distress.  HENT:  Head: Normocephalic.  Eyes: Pupils are equal, round, and reactive to light.  Neck: Neck supple.  Respiratory: Effort normal.  GI: Soft. There is generalized tenderness. There is no rigidity, no rebound and no guarding.  Genitourinary:  Speculum exam: Vagina - small amount of dark blood in vaginal canal  Cervix - active dark red bleeding from os  Bimanual exam: Cervix closed, no CMT  Uterus non tender, normal size Bilateral adnexal tenderness, no masses bilaterally, +suprapubic tenderness  GC/Chlam, wet prep done Chaperone present for exam.   Musculoskeletal: Normal range of motion.  Neurological: She is alert and oriented to person, place, and time.  Skin: Skin is warm. She is not diaphoretic.  Psychiatric: Her behavior is normal.    MAU Course  Procedures None  MDM Toradol 60 mg IM given in MAU  CBC Wet prep  GC  Results for orders  placed during the hospital encounter of 11/28/13 (from the past 24 hour(s))  URINALYSIS, ROUTINE W REFLEX MICROSCOPIC  Status: Abnormal   Collection Time    11/28/13  6:15 PM      Result Value Ref Range   Color, Urine YELLOW  YELLOW   APPearance HAZY (*) CLEAR   Specific Gravity, Urine 1.020  1.005 - 1.030   pH 7.5  5.0 - 8.0   Glucose, UA NEGATIVE  NEGATIVE mg/dL   Hgb urine dipstick TRACE (*) NEGATIVE   Bilirubin Urine NEGATIVE  NEGATIVE   Ketones, ur NEGATIVE  NEGATIVE mg/dL   Protein, ur NEGATIVE  NEGATIVE mg/dL   Urobilinogen, UA 0.2  0.0 - 1.0 mg/dL   Nitrite NEGATIVE  NEGATIVE   Leukocytes, UA TRACE (*) NEGATIVE  URINE MICROSCOPIC-ADD ON     Status: Abnormal   Collection Time    11/28/13  6:15 PM      Result Value Ref Range   Squamous Epithelial / LPF FEW (*) RARE   WBC, UA 3-6  <3 WBC/hpf   RBC / HPF 0-2  <3 RBC/hpf   Bacteria, UA FEW (*) RARE  POCT PREGNANCY, URINE     Status: None   Collection Time    11/28/13  6:43 PM      Result Value Ref Range   Preg Test, Ur NEGATIVE  NEGATIVE  CBC     Status: Abnormal   Collection Time    11/28/13  7:30 PM      Result Value Ref Range   WBC 5.2  4.0 - 10.5 K/uL   RBC 3.89  3.87 - 5.11 MIL/uL   Hemoglobin 12.4  12.0 - 15.0 g/dL   HCT 40.934.9 (*) 81.136.0 - 91.446.0 %   MCV 89.7  78.0 - 100.0 fL   MCH 31.9  26.0 - 34.0 pg   MCHC 35.5  30.0 - 36.0 g/dL   RDW 78.212.2  95.611.5 - 21.315.5 %   Platelets 198  150 - 400 K/uL  WET PREP, GENITAL     Status: Abnormal   Collection Time    11/28/13  8:15 PM      Result Value Ref Range   Yeast Wet Prep HPF POC NONE SEEN  NONE SEEN   Trich, Wet Prep NONE SEEN  NONE SEEN   Clue Cells Wet Prep HPF POC NONE SEEN  NONE SEEN   WBC, Wet Prep HPF POC FEW (*) NONE SEEN     Pain somewhat better with toradol.   Assessment and Plan  Report given to Thressa ShellerHeather Dawnna Gritz CNM who resumes care of the patient   1. Chronic pelvic pain in female   2. Endometriosis    RX: toradol PO  Follow-up Information   [facility] (As scheduled)    Specialty:  Obstetrics and Gynecology   Contact information:   [address] [place] [id number] [phone]       Iona HansenJennifer Irene Rasch, NP  11/28/2013, 8:23 PM

## 2013-11-29 ENCOUNTER — Telehealth: Payer: Self-pay | Admitting: *Deleted

## 2013-11-29 ENCOUNTER — Other Ambulatory Visit: Payer: Self-pay | Admitting: Obstetrics & Gynecology

## 2013-11-29 LAB — GC/CHLAMYDIA PROBE AMP
CT Probe RNA: NEGATIVE
GC Probe RNA: NEGATIVE

## 2013-11-29 NOTE — Telephone Encounter (Signed)
Called patient stating I am returning your phone call. Patient stated that she was in MAU last night because her pain was so unbearable and all they did was give her 15 pills and that isn't going to make it till her next appt with us at the end of March. Discussed with patient using motrin for pain relief and then the prescribed pain medication if that doesn't help. Also recommended use of heating pad and warm baths and suggested she call in frequently to check for cancellations but that we couldn't keep refilling her pain medications. Patient verbalized understanding and had no further questions

## 2013-11-29 NOTE — Telephone Encounter (Signed)
Brintellix has been approved for 24 months. We should be receiving a faxed notification shortly.

## 2013-11-29 NOTE — Telephone Encounter (Signed)
Pt left message stating that she was seen @ MAU yesterday.  She is "having a hard time with pain in her sides and doesn't know what to do."

## 2013-11-30 ENCOUNTER — Ambulatory Visit: Payer: 59 | Admitting: Family Medicine

## 2013-11-30 DIAGNOSIS — Z0289 Encounter for other administrative examinations: Secondary | ICD-10-CM

## 2013-12-06 ENCOUNTER — Telehealth: Payer: Self-pay

## 2013-12-06 DIAGNOSIS — N809 Endometriosis, unspecified: Secondary | ICD-10-CM

## 2013-12-06 DIAGNOSIS — R102 Pelvic and perineal pain: Secondary | ICD-10-CM

## 2013-12-06 NOTE — Telephone Encounter (Signed)
Pt. called and stated that she is still having side pain and has tried all of the recommendations but that it is so bad that she has had to take a week off of work. She would like a call back with suggestions on what she can do as she does not have an appointment until 12/27/13

## 2013-12-07 NOTE — MAU Provider Note (Signed)
Attestation of Attending Supervision of Advanced Practitioner (CNM/NP): Evaluation and management procedures were performed by the Advanced Practitioner under my supervision and collaboration. I have reviewed the Advanced Practitioner's note and chart, and I agree with the management and plan.  LEGGETT,KELLY H. 10:39 PM

## 2013-12-08 MED ORDER — TRAMADOL HCL 50 MG PO TABS: 50.0000 mg | ORAL_TABLET | Freq: Four times a day (QID) | ORAL | Status: DC | PRN

## 2013-12-08 NOTE — Telephone Encounter (Signed)
Discussed patient request and chart review with Dr. Debroah LoopArnold. He approved one more refill to last until next appt. 12/27/13. [name] and we discussed he did approve one refill to last until appointment, may use ibuprofen as needed, heating pad, etc.  If pain too severe or medicine not helping to come to MAU only so we can manage her pain. Jenna Blake voices understanding.

## 2013-12-08 NOTE — Telephone Encounter (Signed)
Jenna Blake left another message stating she called a few days ago and is getting frustrated. States no one is willing to help her until she sees us in the clinic.  States she is not able to go to work because the pain is ridiculous and doesn't know what to do. Requests a call back asap.

## 2013-12-16 ENCOUNTER — Ambulatory Visit: Payer: 59 | Admitting: Obstetrics & Gynecology

## 2013-12-20 ENCOUNTER — Inpatient Hospital Stay (HOSPITAL_COMMUNITY)
Admission: AD | Admit: 2013-12-20 | Discharge: 2013-12-20 | Disposition: A | Discharge status: Home / Self Care | Payer: 59 | Source: Ambulatory Visit | Attending: Obstetrics and Gynecology | Admitting: Obstetrics and Gynecology

## 2013-12-20 ENCOUNTER — Encounter (HOSPITAL_COMMUNITY): Payer: Self-pay | Admitting: *Deleted

## 2013-12-20 DIAGNOSIS — G8929 Other chronic pain: Secondary | ICD-10-CM | POA: Insufficient documentation

## 2013-12-20 DIAGNOSIS — Z87891 Personal history of nicotine dependence: Secondary | ICD-10-CM | POA: Insufficient documentation

## 2013-12-20 DIAGNOSIS — N949 Unspecified condition associated with female genital organs and menstrual cycle: Secondary | ICD-10-CM | POA: Insufficient documentation

## 2013-12-20 DIAGNOSIS — Z8742 Personal history of other diseases of the female genital tract: Secondary | ICD-10-CM | POA: Insufficient documentation

## 2013-12-20 DIAGNOSIS — N809 Endometriosis, unspecified: Secondary | ICD-10-CM

## 2013-12-20 DIAGNOSIS — R109 Unspecified abdominal pain: Secondary | ICD-10-CM | POA: Insufficient documentation

## 2013-12-20 LAB — URINALYSIS, ROUTINE W REFLEX MICROSCOPIC
BILIRUBIN URINE: NEGATIVE
Glucose, UA: NEGATIVE mg/dL
Hgb urine dipstick: NEGATIVE
Ketones, ur: NEGATIVE mg/dL
NITRITE: NEGATIVE
PH: 8.5 — AB (ref 5.0–8.0)
Protein, ur: NEGATIVE mg/dL
Specific Gravity, Urine: 1.025 (ref 1.005–1.030)
Urobilinogen, UA: 1 mg/dL (ref 0.0–1.0)

## 2013-12-20 LAB — URINE MICROSCOPIC-ADD ON

## 2013-12-20 LAB — POCT PREGNANCY, URINE: Preg Test, Ur: NEGATIVE

## 2013-12-20 MED ORDER — KETOROLAC TROMETHAMINE 60 MG/2ML IM SOLN
60.0000 mg | Freq: Once | INTRAMUSCULAR | Status: AC
  Administered 2013-12-20: 60 mg via INTRAMUSCULAR
  Filled 2013-12-20: qty 2

## 2013-12-20 MED ORDER — OXYCODONE-ACETAMINOPHEN 5-325 MG PO TABS
1.0000 | ORAL_TABLET | Freq: Once | ORAL | Status: AC
  Administered 2013-12-20: 1 via ORAL
  Filled 2013-12-20: qty 1

## 2013-12-20 MED ORDER — PROMETHAZINE HCL 25 MG PO TABS
25.0000 mg | ORAL_TABLET | Freq: Once | ORAL | Status: AC
  Administered 2013-12-20: 25 mg via ORAL
  Filled 2013-12-20: qty 1

## 2013-12-20 NOTE — MAU Provider Note (Signed)
History     CSN: [id number]  Arrival date and time: 12/20/13 1642   First Provider Initiated Contact with Patient 12/20/13 1724      Chief Complaint  Patient presents with  . Abdominal Pain   HPI Comments: Jenna Blake 22 y.o. G0P0000 presents to MAU with chronic pelvic pain that is uncontrolled at this time. She has a history of endometriosis and was started on BCPs. She states she was not told to skip the placebo pills and is on those now and she expects her menses tomorrow. She feels that if she can have something for pain now it will hold her till [facility].   Abdominal Pain      Past Medical History  Diagnosis Date  . Endometriosis   . Depression     Diagnosed at age 23    Past Surgical History  Procedure Laterality Date  . Cholecystectomy    . Tonsillectomy    . Laparoscopic endometriosis fulguration      Family History  Problem Relation Age of Onset  . Hypertension Father   . Diabetes Maternal Grandmother     History  Substance Use Topics  . Smoking status: Former Smoker -- 0.50 packs/day    Types: Cigarettes  . Smokeless tobacco: Never Used  . Alcohol Use: No     Comment: Former user    Allergies:  Allergies  Allergen Reactions  . Ativan [Lorazepam] Other (See Comments)    Makes her feel crazy  . Betadine [Povidone Iodine] Hives  . Reglan [Metoclopramide] Other (See Comments)    "bad thoughts, makes me feel weird."  . Amoxicillin Rash    Prescriptions prior to admission  Medication Sig Dispense Refill  . acetaminophen (TYLENOL) 325 MG tablet Take 650 mg by mouth every 6 (six) hours as needed for moderate pain.      Marland Kitchen. gabapentin (NEURONTIN) 600 MG tablet Take 1 tablet (600 mg total) by mouth at bedtime.  30 tablet  3  . ibuprofen (ADVIL,MOTRIN) 200 MG tablet Take 200 mg by mouth every 6 (six) hours as needed for moderate pain.      Marland Kitchen. ketorolac (TORADOL) 10 MG tablet Take 10 mg by mouth every 6 (six) hours as needed  for moderate pain.      Marland Kitchen. levonorgestrel-ethinyl estradiol (SEASONALE) 0.15-0.03 MG tablet Take 1 tablet by mouth daily.  1 Package  11  . naproxen (NAPROSYN) 500 MG tablet Take 1 tablet (500 mg total) by mouth 2 (two) times daily.  20 tablet  0  . Naproxen Sodium (ALEVE PO) Take 1 tablet by mouth as needed (abdominal pain).      . traMADol (ULTRAM) 50 MG tablet Take 50 mg by mouth every 6 (six) hours as needed for moderate pain.      . Vortioxetine HBr (BRINTELLIX) 10 MG TABS Take 1 tablet (10 mg total) by mouth daily.  30 tablet  3    Review of Systems  Constitutional: Negative.   HENT: Negative.   Respiratory: Negative.   Cardiovascular: Negative.   Gastrointestinal: Positive for abdominal pain.  Genitourinary: Negative.   Musculoskeletal: Negative.   Skin: Negative.   Neurological: Negative.   Psychiatric/Behavioral: Negative.    Physical Exam   Blood pressure 121/68, pulse 96, temperature 98.4 F (36.9 C), temperature source Oral, resp. rate 16, height 5\' 5"  (1.651 m), last menstrual period 11/21/2013, SpO2 100.00%.  Physical Exam  Constitutional: She is oriented to person, place, and time. She appears  well-developed and well-nourished. She appears distressed.  She is rocking and moving around with pain  HENT:  Head: Normocephalic and atraumatic.  Eyes: Pupils are equal, round, and reactive to light.  Neck: Normal range of motion.  Cardiovascular: Normal rate, regular rhythm and normal heart sounds.   Respiratory: Effort normal and breath sounds normal.  GI: Soft. Bowel sounds are normal. There is tenderness.  Genitourinary:  Pt declined examination  Musculoskeletal: Normal range of motion.  Neurological: She is alert and oriented to person, place, and time.  Skin: Skin is warm and dry.  Psychiatric: She has a normal mood and affect. Her behavior is normal. Judgment and thought content normal.    MAU Course  Procedures  MDM  Toradol 60 mg IM now Percocet/  phenergan now  Assessment and Plan   A: Chronic pelvic pain with endometriosis  P: Above meds given [facility] Do not take placebo pills/ stay on active pills and avoid menses   Carolynn Serve 12/20/2013, 5:47 PM

## 2013-12-20 NOTE — Discharge Instructions (Signed)
Endometriosis Endometriosis is a condition in which the tissue that lines the uterus (endometrium) grows outside of its normal location. The tissue may grow in many locations close to the uterus, but it commonly grows on the ovaries, fallopian tubes, vagina, or bowel. Because the uterus expels, or sheds, its lining every menstrual cycle, there is bleeding wherever the endometrial tissue is located. This can cause pain because blood is irritating to tissues not normally exposed to it.  CAUSES  The cause of endometriosis is not known.  SIGNS AND SYMPTOMS  Often, there are no symptoms. When symptoms are present, they can vary with the location of the displaced tissue. Various symptoms can occur at different times. Although symptoms occur mainly during a woman's menstrual period, they can also occur midcycle and usually stop with menopause. Some people may go months with no symptoms at all. Symptoms may include:   Back or abdominal pain.   Heavier bleeding during periods.   Pain during intercourse.   Painful bowel movements.   Infertility. DIAGNOSIS  Your health care provider will do a physical exam and ask about your symptoms. Various tests may be done, such as:   Blood tests and urine tests. These are done to help rule out other problems.   Ultrasound. This test is done to look for abnormal tissue.   An X-ray of the lower bowel (barium enema).  Laparoscopy. In this procedure, a thin, lighted tube with a tiny camera on the end (laparoscope) is inserted into your abdomen. This helps your health care provider look for abnormal tissue to confirm the diagnosis. The health care provider may also remove a small piece of tissue (biopsy) from any abnormal tissue found. This tissue sample can then be sent to a lab so it can be looked at under a microscope. TREATMENT  Treatment will vary and may include:   Medicines to relieve pain. Nonsteroidal anti-inflammatory drugs (NSAIDs) are a type of  pain medicine that can help to relieve the pain caused by endometriosis.  Hormonal therapy. When using hormonal therapy, periods are eliminated. This eliminates the monthly exposure to blood by the displaced endometrial tissue.   Surgery. Surgery may sometimes be done to remove the abnormal endometrial tissue. In severe cases, surgery may be done to remove the fallopian tubes, uterus, and ovaries (hysterectomy). HOME CARE INSTRUCTIONS   Only take over-the-counter or prescription medicines for pain, discomfort, or fever as directed by your health care provider. Do not take aspirin because it may increase bleeding when you are not on hormonal therapy.   Avoid activities that produce pain, including sexual activity. SEEK MEDICAL CARE IF:  You have pelvic pain before, after, or during your periods.  You have pelvic pain between periods that gets worse during your period.  You have pelvic pain during or after sex.  You have pelvic pain with bowel movements or urination, especially during your period.  You have problems getting pregnant. SEEK IMMEDIATE MEDICAL CARE IF:   Your pain is severe and is not responding to pain medicine.   You have severe nausea and vomiting, or you cannot keep foods down.   You have pain that is limited to the right lower part of your abdomen.   You have swelling or increasing pain in your abdomen.   You see blood in your stool.   You have a fever or persistent symptoms for more than 2 3 days.   You have a fever and your symptoms suddenly get worse. MAKE SURE YOU:     Understand these instructions.  Will watch your condition.  Will get help right away if you are not doing well or get worse. Document Released: 09/20/2000 Document Revised: 07/14/2013 Document Reviewed: 05/21/2013 [employer] Patient Information 2014 [employer].  

## 2013-12-20 NOTE — MAU Note (Signed)
Patient has a history of pelvic pain and has an appointment on 3-23 but states the pain is so bad in the last 15-20 minutes that she cannot wait. Denies bleeding States it is time for her period.

## 2013-12-21 ENCOUNTER — Encounter (HOSPITAL_COMMUNITY): Payer: Self-pay | Admitting: *Deleted

## 2013-12-21 ENCOUNTER — Inpatient Hospital Stay (HOSPITAL_COMMUNITY)
Admission: AD | Admit: 2013-12-21 | Discharge: 2013-12-22 | Disposition: A | Discharge status: Home / Self Care | Payer: 59 | Source: Ambulatory Visit | Attending: Family Medicine | Admitting: Family Medicine

## 2013-12-21 DIAGNOSIS — Z87891 Personal history of nicotine dependence: Secondary | ICD-10-CM | POA: Insufficient documentation

## 2013-12-21 DIAGNOSIS — N809 Endometriosis, unspecified: Secondary | ICD-10-CM | POA: Insufficient documentation

## 2013-12-21 DIAGNOSIS — F329 Major depressive disorder, single episode, unspecified: Secondary | ICD-10-CM | POA: Insufficient documentation

## 2013-12-21 DIAGNOSIS — N390 Urinary tract infection, site not specified: Secondary | ICD-10-CM | POA: Insufficient documentation

## 2013-12-21 DIAGNOSIS — F3289 Other specified depressive episodes: Secondary | ICD-10-CM | POA: Insufficient documentation

## 2013-12-21 LAB — URINALYSIS, ROUTINE W REFLEX MICROSCOPIC
BILIRUBIN URINE: NEGATIVE
Glucose, UA: NEGATIVE mg/dL
KETONES UR: NEGATIVE mg/dL
Leukocytes, UA: NEGATIVE
Nitrite: NEGATIVE
PH: 8.5 — AB (ref 5.0–8.0)
Protein, ur: NEGATIVE mg/dL
Specific Gravity, Urine: 1.015 (ref 1.005–1.030)
Urobilinogen, UA: 0.2 mg/dL (ref 0.0–1.0)

## 2013-12-21 LAB — CBC
HCT: 35.4 % — ABNORMAL LOW (ref 36.0–46.0)
HEMOGLOBIN: 12.8 g/dL (ref 12.0–15.0)
MCH: 33.2 pg (ref 26.0–34.0)
MCHC: 36.2 g/dL — ABNORMAL HIGH (ref 30.0–36.0)
MCV: 91.9 fL (ref 78.0–100.0)
Platelets: 177 10*3/uL (ref 150–400)
RBC: 3.85 MIL/uL — ABNORMAL LOW (ref 3.87–5.11)
RDW: 12.7 % (ref 11.5–15.5)
WBC: 5.9 10*3/uL (ref 4.0–10.5)

## 2013-12-21 LAB — URINE MICROSCOPIC-ADD ON

## 2013-12-21 MED ORDER — ONDANSETRON 8 MG PO TBDP
8.0000 mg | ORAL_TABLET | Freq: Once | ORAL | Status: AC
  Administered 2013-12-21: 8 mg via ORAL
  Filled 2013-12-21: qty 1

## 2013-12-21 MED ORDER — TRAMADOL HCL 50 MG PO TABS
100.0000 mg | ORAL_TABLET | Freq: Once | ORAL | Status: AC
  Administered 2013-12-22: 50 mg via ORAL
  Filled 2013-12-21: qty 2

## 2013-12-21 NOTE — MAU Note (Signed)
Pt reports she was seen here yesterday and today her right side pain is worsening. States she has an appointment but the Aleve is not helping. Nausea but not vomiting.

## 2013-12-21 NOTE — MAU Provider Note (Signed)
Attestation of Attending Supervision of Advanced Practitioner (CNM/NP): Evaluation and management procedures were performed by the Advanced Practitioner under my supervision and collaboration.  I have reviewed the Advanced Practitioner's note and chart, and I agree with the management and plan.  Uriel Horkey 12/21/2013 12:56 PM   

## 2013-12-22 DIAGNOSIS — N809 Endometriosis, unspecified: Secondary | ICD-10-CM

## 2013-12-22 MED ORDER — CELECOXIB 100 MG PO CAPS: 100.0000 mg | ORAL_CAPSULE | Freq: Two times a day (BID) | ORAL | Status: DC

## 2013-12-22 MED ORDER — KETOROLAC TROMETHAMINE 10 MG PO TABS
10.0000 mg | ORAL_TABLET | Freq: Once | ORAL | Status: DC
  Filled 2013-12-22: qty 1

## 2013-12-22 MED ORDER — TRAMADOL HCL 50 MG PO TABS
50.0000 mg | ORAL_TABLET | Freq: Once | ORAL | Status: AC
  Administered 2013-12-22: 50 mg via ORAL
  Filled 2013-12-22: qty 1

## 2013-12-22 MED ORDER — CIPROFLOXACIN HCL 500 MG PO TABS: 500.0000 mg | ORAL_TABLET | Freq: Two times a day (BID) | ORAL | Status: DC

## 2013-12-22 MED ORDER — TRAMADOL HCL 50 MG PO TABS: 50.0000 mg | ORAL_TABLET | Freq: Four times a day (QID) | ORAL | Status: DC | PRN

## 2013-12-22 NOTE — MAU Provider Note (Signed)
Attestation of Attending Supervision of Advanced Practitioner (PA/CNM/NP): Evaluation and management procedures were performed by the Advanced Practitioner under my supervision and collaboration.  I have reviewed the Advanced Practitioner's note and chart, and I agree with the management and plan.  Reva BoresPRATT,Eilee Schader S, MD [facility] Attending 12/22/2013 6:48 AM

## 2013-12-22 NOTE — MAU Provider Note (Signed)
Chief Complaint: Abdominal Pain   First Provider Initiated Contact with Patient 12/22/13 0011     SUBJECTIVE HPI: Jenna Blake is a 23 y.o. G0P0000 female who presents with worsening right-sided pain. Same location and character of usual endometriosis pain, but is gradually worsened over the last few months to the point where it hurts as much as it did before endometriosis surgery 2 years ago. Was seen yesterday for the same. Declined refills of her pain medication because she thought she could make it to her appointment on 3/23, but has been unable to tolerate the pain tonight. Takes OCPs for endometriosis pain, but has not been continuously cycling them. Was instructed to skip sugar pills when seen in maternity admissions yesterday. Also has developed nausea over the past few days. Takes Aleve 2 tablets every 6 hours and Ultram as needed for breakthrough pain, ran out of Ultram.  Past Medical History  Diagnosis Date  . Endometriosis   . Depression     Diagnosed at age 47   OB History  Gravida Para Term Preterm AB SAB TAB Ectopic Multiple Living  0 0 0 0 0 0 0 0 0 0        Past Surgical History  Procedure Laterality Date  . Cholecystectomy    . Tonsillectomy    . Laparoscopic endometriosis fulguration     History   Social History  . Marital Status: Single    Spouse Name: N/A    Number of Children: N/A  . Years of Education: N/A   Occupational History  . Not on file.   Social History Main Topics  . Smoking status: Former Smoker -- 0.50 packs/day    Types: Cigarettes  . Smokeless tobacco: Never Used  . Alcohol Use: No     Comment: Former user  . Drug Use: No     Comment: Former user  . Sexual Activity: Yes    Birth Control/ Protection: Pill   Other Topics Concern  . Not on file   Social History Narrative  . No narrative on file   No current facility-administered medications on file prior to encounter.   Current Outpatient Prescriptions on File Prior to Encounter   Medication Sig Dispense Refill  . acetaminophen (TYLENOL) 325 MG tablet Take 650 mg by mouth every 6 (six) hours as needed for moderate pain.      Marland Kitchen gabapentin (NEURONTIN) 600 MG tablet Take 1 tablet (600 mg total) by mouth at bedtime.  30 tablet  3  . levonorgestrel-ethinyl estradiol (SEASONALE) 0.15-0.03 MG tablet Take 1 tablet by mouth daily.  1 Package  11  . Vortioxetine HBr (BRINTELLIX) 10 MG TABS Take 1 tablet (10 mg total) by mouth daily.  30 tablet  3   Allergies  Allergen Reactions  . Ativan [Lorazepam] Other (See Comments)    Makes her feel crazy  . Betadine [Povidone Iodine] Hives  . Reglan [Metoclopramide] Other (See Comments)    "bad thoughts, makes me feel weird."  . Amoxicillin Rash    ROS: Positive for pain along entire right side, nausea and frequency of urination. Negative for fever, chills, dysuria, hematuria, vomiting, constipation, vaginal discharge. Menstrual period started today. Has had slightly loose stools today consistent with what she usually experiences at the beginning of her period.  OBJECTIVE Blood pressure 115/76, pulse 92, temperature 98.5 F (36.9 C), temperature source Oral, resp. rate 16, height 5\' 5"  (1.651 m), weight 48.988 kg (108 lb), last menstrual period 12/21/2013, SpO2 100.00%. GENERAL: Well-developed, well-nourished female  in moderate distress, rocking.  HEENT: Normocephalic HEART: normal rate RESP: normal effort ABDOMEN: Soft, mild to moderate generalized abdominal pain, greater along entire right side. Mild right CVA tenderness. Neg rebound. Neg mass. Normal bowel sounds x4. EXTREMITIES: Nontender, no edema NEURO: Alert and oriented SPECULUM EXAM: Declined to do to recent exam and cultures.  LAB RESULTS Results for orders placed during the hospital encounter of 12/21/13 (from the past 24 hour(s))  URINALYSIS, ROUTINE W REFLEX MICROSCOPIC     Status: Abnormal   Collection Time    12/21/13 11:05 PM      Result Value Ref Range    Color, Urine YELLOW  YELLOW   APPearance CLOUDY (*) CLEAR   Specific Gravity, Urine 1.015  1.005 - 1.030   pH 8.5 (*) 5.0 - 8.0   Glucose, UA NEGATIVE  NEGATIVE mg/dL   Hgb urine dipstick LARGE (*) NEGATIVE   Bilirubin Urine NEGATIVE  NEGATIVE   Ketones, ur NEGATIVE  NEGATIVE mg/dL   Protein, ur NEGATIVE  NEGATIVE mg/dL   Urobilinogen, UA 0.2  0.0 - 1.0 mg/dL   Nitrite NEGATIVE  NEGATIVE   Leukocytes, UA NEGATIVE  NEGATIVE  URINE MICROSCOPIC-ADD ON     Status: Abnormal   Collection Time    12/21/13 11:05 PM      Result Value Ref Range   Squamous Epithelial / LPF FEW (*) RARE   WBC, UA 0-2  <3 WBC/hpf   RBC / HPF 3-6  <3 RBC/hpf   Bacteria, UA MANY (*) RARE  CBC     Status: Abnormal   Collection Time    12/21/13 11:50 PM      Result Value Ref Range   WBC 5.9  4.0 - 10.5 K/uL   RBC 3.85 (*) 3.87 - 5.11 MIL/uL   Hemoglobin 12.8  12.0 - 15.0 g/dL   HCT 16.1 (*) 09.6 - 04.5 %   MCV 91.9  78.0 - 100.0 fL   MCH 33.2  26.0 - 34.0 pg   MCHC 36.2 (*) 30.0 - 36.0 g/dL   RDW 40.9  81.1 - 91.4 %   Platelets 177  150 - 400 K/uL    IMAGING No results found.  MAU COURSE Adequate relief of pain w/ Ultram.   Low suspicion for appendicitis due to location of pain and absence of fever and leukocytosis. Patient feels strongly that this is the same as her usual endometriosis pain. Discussed appendicitis signs and symptoms. Patient declines additional workup.  Suspect stomach upset may be related to either excessive and long-term use of Aleve. Will DC Aleve and start patient on Celebrex and instructed to take it with food. Patient has Zofran at home to use as needed.  Possible UTI versus very mild pyelonephritis. Will treat with seven-day course of Cipro.  ASSESSMENT 1. Endometriosis   2. UTI (lower urinary tract infection)     PLAN Discharge home in stable condition. Take medications with food. Take Celebrex on schedule and Ultram only as needed for breakthrough pain.  Push  fluids. Appendicitis precautions.     Follow-up Information   Follow up with [facility] On 12/27/2013. (As scheduled)    Contact information:   [address] [place] [address] [phone]       Follow up with [facility] [place] [facility]. (As needed in emergencies)    Contact information:   [address] [id number] [place] [address] [phone]       Medication List  STOP taking these medications       ALEVE PO     ibuprofen 200 MG tablet  Commonly known as:  ADVIL,MOTRIN     ketorolac 10 MG tablet  Commonly known as:  TORADOL     naproxen 500 MG tablet  Commonly known as:  NAPROSYN      TAKE these medications       acetaminophen 325 MG tablet  Commonly known as:  TYLENOL  Take 650 mg by mouth every 6 (six) hours as needed for moderate pain.     celecoxib 100 MG capsule  Commonly known as:  CELEBREX  Take 1-2 capsules (100-200 mg total) by mouth 2 (two) times daily.     ciprofloxacin 500 MG tablet  Commonly known as:  CIPRO  Take 1 tablet (500 mg total) by mouth 2 (two) times daily.     gabapentin 600 MG tablet  Commonly known as:  NEURONTIN  Take 1 tablet (600 mg total) by mouth at bedtime.     levonorgestrel-ethinyl estradiol 0.15-0.03 MG tablet  Commonly known as:  SEASONALE  Take 1 tablet by mouth daily.     traMADol 50 MG tablet  Commonly known as:  ULTRAM  Take 1 tablet (50 mg total) by mouth every 6 (six) hours as needed for moderate pain or severe pain.     Vortioxetine HBr 10 MG Tabs  Commonly known as:  BRINTELLIX  Take 1 tablet (10 mg total) by mouth daily.       [place] Jeananne Bedwell, CNM 12/22/2013  12:47 AM

## 2013-12-22 NOTE — Discharge Instructions (Signed)
Endometriosis Endometriosis is a condition in which the tissue that lines the uterus (endometrium) grows outside of its normal location. The tissue may grow in many locations close to the uterus, but it commonly grows on the ovaries, fallopian tubes, vagina, or bowel. Because the uterus expels, or sheds, its lining every menstrual cycle, there is bleeding wherever the endometrial tissue is located. This can cause pain because blood is irritating to tissues not normally exposed to it.  CAUSES  The cause of endometriosis is not known.  SIGNS AND SYMPTOMS  Often, there are no symptoms. When symptoms are present, they can vary with the location of the displaced tissue. Various symptoms can occur at different times. Although symptoms occur mainly during a woman's menstrual period, they can also occur midcycle and usually stop with menopause. Some people may go months with no symptoms at all. Symptoms may include:   Back or abdominal pain.   Heavier bleeding during periods.   Pain during intercourse.   Painful bowel movements.   Infertility. DIAGNOSIS  Your health care provider will do a physical exam and ask about your symptoms. Various tests may be done, such as:   Blood tests and urine tests. These are done to help rule out other problems.   Ultrasound. This test is done to look for abnormal tissue.   An X-ray of the lower bowel (barium enema).  Laparoscopy. In this procedure, a thin, lighted tube with a tiny camera on the end (laparoscope) is inserted into your abdomen. This helps your health care provider look for abnormal tissue to confirm the diagnosis. The health care provider may also remove a small piece of tissue (biopsy) from any abnormal tissue found. This tissue sample can then be sent to a lab so it can be looked at under a microscope. TREATMENT  Treatment will vary and may include:   Medicines to relieve pain. Nonsteroidal anti-inflammatory drugs (NSAIDs) are a type of  pain medicine that can help to relieve the pain caused by endometriosis.  Hormonal therapy. When using hormonal therapy, periods are eliminated. This eliminates the monthly exposure to blood by the displaced endometrial tissue.   Surgery. Surgery may sometimes be done to remove the abnormal endometrial tissue. In severe cases, surgery may be done to remove the fallopian tubes, uterus, and ovaries (hysterectomy). HOME CARE INSTRUCTIONS   Only take over-the-counter or prescription medicines for pain, discomfort, or fever as directed by your health care provider. Do not take aspirin because it may increase bleeding when you are not on hormonal therapy.   Avoid activities that produce pain, including sexual activity. SEEK MEDICAL CARE IF:  You have pelvic pain before, after, or during your periods.  You have pelvic pain between periods that gets worse during your period.  You have pelvic pain during or after sex.  You have pelvic pain with bowel movements or urination, especially during your period.  You have problems getting pregnant. SEEK IMMEDIATE MEDICAL CARE IF:   Your pain is severe and is not responding to pain medicine.   You have severe nausea and vomiting, or you cannot keep foods down.   You have pain that is limited to the right lower part of your abdomen.   You have swelling or increasing pain in your abdomen.   You see blood in your stool.   You have a fever or persistent symptoms for more than 2 3 days.   You have a fever and your symptoms suddenly get worse. MAKE SURE YOU:  Understand these instructions.  Will watch your condition.  Will get help right away if you are not doing well or get worse. Document Released: 09/20/2000 Document Revised: 07/14/2013 Document Reviewed: 05/21/2013 [facility] Patient Information 2014 [place], [place].  Urinary Tract Infection Urinary tract infections (UTIs) can develop anywhere along your urinary tract. Your urinary  tract is your body's drainage system for removing wastes and extra water. Your urinary tract includes two kidneys, two ureters, a bladder, and a urethra. Your kidneys are a pair of bean-shaped organs. Each kidney is about the size of your fist. They are located below your ribs, one on each side of your spine. CAUSES Infections are caused by microbes, which are microscopic organisms, including fungi, viruses, and bacteria. These organisms are so small that they can only be seen through a microscope. Bacteria are the microbes that most commonly cause UTIs. SYMPTOMS  Symptoms of UTIs may vary by age and gender of the patient and by the location of the infection. Symptoms in young women typically include a frequent and intense urge to urinate and a painful, burning feeling in the bladder or urethra during urination. Older women and men are more likely to be tired, shaky, and weak and have muscle aches and abdominal pain. A fever may mean the infection is in your kidneys. Other symptoms of a kidney infection include pain in your back or sides below the ribs, nausea, and vomiting. DIAGNOSIS To diagnose a UTI, your caregiver will ask you about your symptoms. Your caregiver also will ask to provide a urine sample. The urine sample will be tested for bacteria and white blood cells. White blood cells are made by your body to help fight infection. TREATMENT  Typically, UTIs can be treated with medication. Because most UTIs are caused by a bacterial infection, they usually can be treated with the use of antibiotics. The choice of antibiotic and length of treatment depend on your symptoms and the type of bacteria causing your infection. HOME CARE INSTRUCTIONS  If you were prescribed antibiotics, take them exactly as your caregiver instructs you. Finish the medication even if you feel better after you have only taken some of the medication.  Drink enough water and fluids to keep your urine clear or pale  yellow.  Avoid caffeine, tea, and carbonated beverages. They tend to irritate your bladder.  Empty your bladder often. Avoid holding urine for long periods of time.  Empty your bladder before and after sexual intercourse.  After a bowel movement, women should cleanse from front to back. Use each tissue only once. SEEK MEDICAL CARE IF:   You have back pain.  You develop a fever.  Your symptoms do not begin to resolve within 3 days. SEEK IMMEDIATE MEDICAL CARE IF:   You have severe back pain or lower abdominal pain.  You develop chills.  You have nausea or vomiting.  You have continued burning or discomfort with urination. MAKE SURE YOU:   Understand these instructions.  Will watch your condition.  Will get help right away if you are not doing well or get worse. Document Released: 07/03/2005 Document Revised: 03/24/2012 Document Reviewed: 11/01/2011 [facility] Patient Information 2014 [place], [place].   Pelvic Pain, Female Female pelvic pain can be caused by many different things and start from a variety of places. Pelvic pain refers to pain that is located in the lower half of the abdomen and between your hips. The pain may occur over a short period of time (acute) or may be reoccurring (  chronic). The cause of pelvic pain may be related to disorders affecting the female reproductive organs (gynecologic), but it may also be related to the bladder, kidney stones, an intestinal complication, or muscle or skeletal problems. Getting help right away for pelvic pain is important, especially if there has been severe, sharp, or a sudden onset of unusual pain. It is also important to get help right away because some types of pelvic pain can be life threatening.  CAUSES  Below are only some of the causes of pelvic pain. The causes of pelvic pain can be in one of several categories.   Gynecologic.  Pelvic inflammatory disease.  Sexually transmitted infection.  Ovarian cyst or a  twisted ovarian ligament (ovarian torsion).  Uterine lining that grows outside the uterus (endometriosis).  Fibroids, cysts, or tumors.  Ovulation.  Pregnancy.  Pregnancy that occurs outside the uterus (ectopic pregnancy).  Miscarriage.  Labor.  Abruption of the placenta or ruptured uterus.  Infection.  Uterine infection (endometritis).  Bladder infection.  Diverticulitis.  Miscarriage related to a uterine infection (septic abortion).  Bladder.  Inflammation of the bladder (cystitis).  Kidney stone(s).  Gastrointenstinal.  Constipation.  Diverticulitis.  Neurologic.  Trauma.  Feeling pelvic pain because of mental or emotional causes (psychosomatic).  Cancers of the bowel or pelvis. EVALUATION  Your caregiver will want to take a careful history of your concerns. This includes recent changes in your health, a careful gynecologic history of your periods (menses), and a sexual history. Obtaining your family history and medical history is also important. Your caregiver may suggest a pelvic exam. A pelvic exam will help identify the location and severity of the pain. It also helps in the evaluation of which organ system may be involved. In order to identify the cause of the pelvic pain and be properly treated, your caregiver may order tests. These tests may include:   A pregnancy test.  Pelvic ultrasonography.  An X-ray exam of the abdomen.  A urinalysis or evaluation of vaginal discharge.  Blood tests. HOME CARE INSTRUCTIONS   Only take over-the-counter or prescription medicines for pain, discomfort, or fever as directed by your caregiver.   Rest as directed by your caregiver.   Eat a balanced diet.   Drink enough fluids to make your urine clear or pale yellow, or as directed.   Avoid sexual intercourse if it causes pain.   Apply warm or cold compresses to the lower abdomen depending on which one helps the pain.   Avoid stressful situations.    Keep a journal of your pelvic pain. Write down when it started, where the pain is located, and if there are things that seem to be associated with the pain, such as food or your menstrual cycle.  Follow up with your caregiver as directed.  SEEK MEDICAL CARE IF:  Your medicine does not help your pain.  You have abnormal vaginal discharge. SEEK IMMEDIATE MEDICAL CARE IF:   You have heavy bleeding from the vagina.   Your pelvic pain increases.   You feel lightheaded or faint.   You have chills.   You have pain with urination or blood in your urine.   You have uncontrolled diarrhea or vomiting.   You have a fever or persistent symptoms for more than 3 days.  You have a fever and your symptoms suddenly get worse.   You are being physically or sexually abused.  MAKE SURE YOU:  Understand these instructions.  Will watch your condition.  Will get  help if you are not doing well or get worse. Document Released: 08/20/2004 Document Revised: 03/24/2012 Document Reviewed: 01/13/2012 [facility] Patient Information 2014 [place].

## 2013-12-27 ENCOUNTER — Encounter: Payer: Self-pay | Admitting: Obstetrics & Gynecology

## 2013-12-27 ENCOUNTER — Ambulatory Visit (INDEPENDENT_AMBULATORY_CARE_PROVIDER_SITE_OTHER): Payer: 59 | Admitting: Obstetrics & Gynecology

## 2013-12-27 VITALS — BP 122/73 | HR 79 | Ht 62.0 in | Wt 105.7 lb

## 2013-12-27 DIAGNOSIS — R102 Pelvic and perineal pain: Secondary | ICD-10-CM

## 2013-12-27 DIAGNOSIS — G8929 Other chronic pain: Secondary | ICD-10-CM

## 2013-12-27 DIAGNOSIS — N809 Endometriosis, unspecified: Secondary | ICD-10-CM

## 2013-12-27 DIAGNOSIS — N949 Unspecified condition associated with female genital organs and menstrual cycle: Secondary | ICD-10-CM

## 2013-12-27 NOTE — Patient Instructions (Signed)
Diagnostic Laparoscopy  Laparoscopy is a surgical procedure. It is used to diagnose and treat diseases inside the belly (abdomen). It is usually a brief, common, and relatively simple procedure. The laparoscopeis a thin, lighted, pencil-sized instrument. It is like a telescope. It is inserted into your abdomen through a small cut (incision). Your caregiver can look at the organs inside your body through this instrument. He or she can see if there is anything abnormal.  Laparoscopy can be done either in a hospital or outpatient clinic. You may be given a mild sedative to help you relax before the procedure. Once in the operating room, you will be given a drug to make you sleep (general anesthesia). Laparoscopy usually lasts less than 1 hour. After the procedure, you will be monitored in a recovery area until you are stable and doing well. Once you are home, it will take 2 to 3 days to fully recover.  RISKS AND COMPLICATIONS   Laparoscopy has relatively few risks. Your caregiver will discuss the risks with you before the procedure.  Some problems that can occur include:  · Infection.  · Bleeding.  · Damage to other organs.  · Anesthetic side effects.  PROCEDURE  Once you receive anesthesia, your surgeon inflates the abdomen with a harmless gas (carbon dioxide). This makes the organs easier to see. The laparoscope is inserted into the abdomen through a small incision. This allows your surgeon to see into the abdomen. Other small instruments are also inserted into the abdomen through other small openings. Many surgeons attach a video camera to the laparoscope to enlarge the view.  During a diagnostic laparoscopy, the surgeon may be looking for inflammation, infection, or cancer. Your surgeon may take tissue samples(biopsies). The samples are sent to a specialist in looking at cells and tissue samples (pathologist). The pathologist examines them under a microscope. Biopsies can help to diagnose or confirm a  disease.  AFTER THE PROCEDURE   · The gas is released from inside the abdomen.  · The incisions are closed with stitches (sutures). Because these incisions are small (usually less than 1/2 inch), there is usually minimal discomfort after the procedure. There may be some mild discomfort in the throat. This is from the tube placed in the throat while you were sleeping. You may have some mild abdominal discomfort. There may also be discomfort from the instrument placement incisions in the abdomen.  · The recovery time is shortened as long as there are no complications.  · You will rest in a recovery room until stable and doing well. As long as there are no complications, you may be allowed to go home.  FINDING OUT THE RESULTS OF YOUR TEST  Not all test results are available during your visit. If your test results are not back during the visit, make an appointment with your caregiver to find out the results. Do not assume everything is normal if you have not heard from your caregiver or the medical facility. It is important for you to follow up on all of your test results.  HOME CARE INSTRUCTIONS   · Take all medicines as directed.  · Only take over-the-counter or prescription medicines for pain, discomfort, or fever as directed by your caregiver.  · Resume daily activities as directed.  · Showers are preferred over baths.  · You may resume sexual activities in 1 week or as directed.  · Do not drive while taking narcotics.  SEEK MEDICAL CARE IF:   · There is   increasing abdominal pain.  · There is new pain in the shoulders (shoulder strap areas).  · You feel lightheaded or faint.  · You have the chills.  · You or your child has an oral temperature above 102° F (38.9° C).  · There is pus-like (purulent) drainage from any of the wounds.  · You are unable to pass gas or have a bowel movement.  · You feel sick to your stomach (nauseous) or throw up (vomit).  MAKE SURE YOU:   · Understand these instructions.  · Will watch  your condition.  · Will get help right away if you are not doing well or get worse.  Document Released: 12/30/2000 Document Revised: 01/18/2013 Document Reviewed: 09/23/2007  [employer]® Patient Information ©2014 [employer].

## 2013-12-27 NOTE — Progress Notes (Signed)
Pt here to followup on pelvic pain. She reports that the pain is worsening and she had to go to ER last week.

## 2013-12-27 NOTE — Progress Notes (Signed)
Patient ID: [name], female   DOB: January 13, 1991, 23 y.o.   MRN: [id number] Patient's last menstrual period was 12/21/2013. Continues to have pelvic pain, was seen in [facility]U 12/22/13 and was given Ultram 30 tabs, take this 3-4 times a day. Offer repeat dx laparoscopy, which she requests She may still have pai afterward, and will recommend Lupron depot afterward,.The procedure and the risk of anesthesia, bleeding, infection, bowel and bladder injury were discussed and her questions were answered. The procedure will be scheduled as an outpatient.   Adam PhenixJames G Arnold, MD 12/27/2013

## 2013-12-28 ENCOUNTER — Inpatient Hospital Stay (HOSPITAL_COMMUNITY)
Admission: AD | Admit: 2013-12-28 | Discharge: 2013-12-28 | Disposition: A | Discharge status: Home / Self Care | Payer: 59 | Source: Ambulatory Visit | Attending: Family Medicine | Admitting: Family Medicine

## 2013-12-28 ENCOUNTER — Encounter (HOSPITAL_COMMUNITY): Payer: Self-pay | Admitting: *Deleted

## 2013-12-28 ENCOUNTER — Telehealth: Payer: Self-pay | Admitting: *Deleted

## 2013-12-28 ENCOUNTER — Other Ambulatory Visit: Payer: Self-pay | Admitting: Obstetrics & Gynecology

## 2013-12-28 DIAGNOSIS — N809 Endometriosis, unspecified: Secondary | ICD-10-CM | POA: Insufficient documentation

## 2013-12-28 DIAGNOSIS — R109 Unspecified abdominal pain: Secondary | ICD-10-CM | POA: Insufficient documentation

## 2013-12-28 DIAGNOSIS — N390 Urinary tract infection, site not specified: Secondary | ICD-10-CM | POA: Insufficient documentation

## 2013-12-28 DIAGNOSIS — Z87891 Personal history of nicotine dependence: Secondary | ICD-10-CM | POA: Insufficient documentation

## 2013-12-28 MED ORDER — OXYCODONE-ACETAMINOPHEN 5-325 MG PO TABS: 2.0000 | ORAL_TABLET | ORAL | Status: DC | PRN

## 2013-12-28 MED ORDER — ONDANSETRON 8 MG PO TBDP
8.0000 mg | ORAL_TABLET | Freq: Once | ORAL | Status: AC
  Administered 2013-12-28: 8 mg via ORAL
  Filled 2013-12-28: qty 1

## 2013-12-28 MED ORDER — OXYCODONE-ACETAMINOPHEN 5-325 MG PO TABS
1.0000 | ORAL_TABLET | Freq: Once | ORAL | Status: AC
  Administered 2013-12-28: 1 via ORAL
  Filled 2013-12-28: qty 1

## 2013-12-28 NOTE — MAU Provider Note (Signed)
Attestation of Attending Supervision of Advanced Practitioner (PA/CNM/NP): Evaluation and management procedures were performed by the Advanced Practitioner under my supervision and collaboration.  I have reviewed the Advanced Practitioner's note and chart, and I agree with the management and plan.  Reva BoresPRATT,Bindi Klomp S, MD [facility] Attending 12/28/2013 10:06 PM

## 2013-12-28 NOTE — MAU Note (Signed)
Pt reports she is still having the same abd pain and was seen in the clinic and is scheduled for surgery for endometriosis.

## 2013-12-28 NOTE — MAU Provider Note (Signed)
History     CSN: [id number]  Arrival date and time: 12/28/13 [id number]   First Provider Initiated Contact with Patient 12/28/13 2023      Chief Complaint  Patient presents with  . Abdominal Pain   HPI  Pt is not pregnant and complains of abd pain.  Pt has hx of endometriosis and is to be scheduled for diagnostic lap. Pt has been taking Ultram and keeps pain down until it hurts really bad. Pt is also taking Celebrex once daily. Pt took Ultram at 7 pm and pain has continued to get. Pt is on Cipro for UTI for bacteria in urine.  Pt has some burning and feeling need to urinate .   Past Medical History  Diagnosis Date  . Endometriosis   . Depression     Diagnosed at age 23    Past Surgical History  Procedure Laterality Date  . Cholecystectomy    . Tonsillectomy    . Laparoscopic endometriosis fulguration      Family History  Problem Relation Age of Onset  . Hypertension Father   . Diabetes Maternal Grandmother     History  Substance Use Topics  . Smoking status: Former Smoker -- 0.50 packs/day    Types: Cigarettes  . Smokeless tobacco: Never Used  . Alcohol Use: No     Comment: Former user    Allergies:  Allergies  Allergen Reactions  . Ativan [Lorazepam] Other (See Comments)    Makes her feel crazy  . Betadine [Povidone Iodine] Hives  . Reglan [Metoclopramide] Other (See Comments)    "bad thoughts, makes me feel weird."  . Amoxicillin Rash    Prescriptions prior to admission  Medication Sig Dispense Refill  . acetaminophen (TYLENOL) 325 MG tablet Take 650 mg by mouth every 6 (six) hours as needed for moderate pain.      . celecoxib (CELEBREX) 100 MG capsule Take 1-2 capsules (100-200 mg total) by mouth 2 (two) times daily.  60 capsule  6  . ciprofloxacin (CIPRO) 500 MG tablet Take 1 tablet (500 mg total) by mouth 2 (two) times daily.  14 tablet  0  . gabapentin (NEURONTIN) 600 MG tablet Take 1 tablet (600 mg total) by mouth at bedtime.  30 tablet  3  .  levonorgestrel-ethinyl estradiol (SEASONALE) 0.15-0.03 MG tablet Take 1 tablet by mouth daily.  1 Package  11  . traMADol (ULTRAM) 50 MG tablet Take 50 mg by mouth every 6 (six) hours as needed for moderate pain or severe pain.      . Vortioxetine HBr (BRINTELLIX) 10 MG TABS Take 1 tablet (10 mg total) by mouth daily.  30 tablet  3  . [DISCONTINUED] traMADol (ULTRAM) 50 MG tablet Take 1 tablet (50 mg total) by mouth every 6 (six) hours as needed for moderate pain or severe pain.  30 tablet  0    Review of Systems  Constitutional: Negative for fever and chills.  Gastrointestinal: Positive for nausea and abdominal pain. Negative for diarrhea and constipation.  Genitourinary: Positive for dysuria, urgency and frequency.       Sx improving - on Cipro   Physical Exam   Blood pressure 121/72, pulse 96, temperature 97.9 F (36.6 C), temperature source Oral, resp. rate 18, height 5\' 2"  (1.575 m), weight 46.72 kg (103 lb), last menstrual period 12/21/2013, SpO2 98.00%.  Physical Exam  Vitals reviewed. Constitutional: She is oriented to person, place, and time. She appears well-developed and well-nourished.  Uncomfortable appearing  HENT:  Head: Normocephalic.  Eyes: Pupils are equal, round, and reactive to light.  Neck: Normal range of motion. Neck supple.  Cardiovascular: Normal rate.   Respiratory: Effort normal.  GI: Soft. There is tenderness.  Musculoskeletal: Normal range of motion.  Neurological: She is alert and oriented to person, place, and time.  Skin: Skin is warm and dry.  Psychiatric: She has a normal mood and affect.    MAU Course  Procedures Pt got improvement in pain with percocet 1 tablet and zofran Pt waiting for surgery to be scheduled- has a few Ultram left but is getting refill Will give Percocet  Assessment and Plan  Abdominal pain Endometriosis- continue Ultram Take Percocet only as needed for breakthrough pain #20 RX  Brena Windsor 12/28/2013, 8:24 PM

## 2013-12-28 NOTE — Telephone Encounter (Signed)
Patient called nurse line requesting refill on Tramadol for pain.  Reviewed notes and found [facility] had sent request to Dr. Debroah LoopArnold today.  Pt had a visit with Dr. Debroah LoopArnold on 12/27/2013.

## 2013-12-31 ENCOUNTER — Encounter (HOSPITAL_COMMUNITY): Payer: Self-pay

## 2013-12-31 ENCOUNTER — Telehealth: Payer: Self-pay | Admitting: *Deleted

## 2013-12-31 ENCOUNTER — Inpatient Hospital Stay (HOSPITAL_COMMUNITY)
Admission: AD | Admit: 2013-12-31 | Discharge: 2013-12-31 | Disposition: A | Discharge status: Home / Self Care | Payer: 59 | Source: Ambulatory Visit | Attending: Obstetrics & Gynecology | Admitting: Obstetrics & Gynecology

## 2013-12-31 ENCOUNTER — Encounter (HOSPITAL_COMMUNITY): Payer: Self-pay | Admitting: *Deleted

## 2013-12-31 DIAGNOSIS — R109 Unspecified abdominal pain: Secondary | ICD-10-CM | POA: Insufficient documentation

## 2013-12-31 DIAGNOSIS — F3289 Other specified depressive episodes: Secondary | ICD-10-CM | POA: Insufficient documentation

## 2013-12-31 DIAGNOSIS — R112 Nausea with vomiting, unspecified: Secondary | ICD-10-CM

## 2013-12-31 DIAGNOSIS — F329 Major depressive disorder, single episode, unspecified: Secondary | ICD-10-CM | POA: Insufficient documentation

## 2013-12-31 DIAGNOSIS — N809 Endometriosis, unspecified: Secondary | ICD-10-CM | POA: Insufficient documentation

## 2013-12-31 LAB — URINALYSIS, ROUTINE W REFLEX MICROSCOPIC
Bilirubin Urine: NEGATIVE
GLUCOSE, UA: NEGATIVE mg/dL
Ketones, ur: NEGATIVE mg/dL
Nitrite: NEGATIVE
PH: 8.5 — AB (ref 5.0–8.0)
Protein, ur: NEGATIVE mg/dL
Specific Gravity, Urine: 1.02 (ref 1.005–1.030)
Urobilinogen, UA: 0.2 mg/dL (ref 0.0–1.0)

## 2013-12-31 LAB — URINE MICROSCOPIC-ADD ON

## 2013-12-31 LAB — POCT PREGNANCY, URINE: PREG TEST UR: NEGATIVE

## 2013-12-31 MED ORDER — PROMETHAZINE HCL 25 MG/ML IJ SOLN
25.0000 mg | Freq: Once | INTRAMUSCULAR | Status: AC
  Administered 2013-12-31: 25 mg via INTRAMUSCULAR
  Filled 2013-12-31: qty 1

## 2013-12-31 MED ORDER — PROMETHAZINE HCL 25 MG PO TABS: 25.0000 mg | ORAL_TABLET | Freq: Four times a day (QID) | ORAL | Status: DC | PRN

## 2013-12-31 NOTE — MAU Note (Addendum)
Was seen here 2 days ago due to endometriosis pain. Given pain med but today can't keep down anything so can't take pain med. And i'm really hurting. Had normal BM this am and then few hours ago had watery BM. Having laporoscopy Tues.

## 2013-12-31 NOTE — MAU Provider Note (Signed)
History     CSN: [id number]  Arrival date and time: 12/31/13 2015   First Provider Initiated Contact with Patient 12/31/13 2218      Chief Complaint  Patient presents with  . Abdominal Pain   HPI Comments: Jenna Blake 22 y.o. G0P0000 presents to MAU for nausea and vomiting with her pain medications she is taking for endometriosis. She is having laparoscopy on Tuesday.  Abdominal Pain Associated symptoms include nausea and vomiting.      Past Medical History  Diagnosis Date  . Endometriosis   . Depression     Diagnosed at age 8    Past Surgical History  Procedure Laterality Date  . Cholecystectomy    . Tonsillectomy    . Laparoscopic endometriosis fulguration      Family History  Problem Relation Age of Onset  . Hypertension Father   . Diabetes Maternal Grandmother     History  Substance Use Topics  . Smoking status: Former Smoker -- 0.50 packs/day    Types: Cigarettes  . Smokeless tobacco: Never Used  . Alcohol Use: No     Comment: Former user    Allergies:  Allergies  Allergen Reactions  . Ativan [Lorazepam] Other (See Comments)    Makes her feel crazy  . Betadine [Povidone Iodine] Hives  . Reglan [Metoclopramide] Other (See Comments)    "bad thoughts, makes me feel weird."  . Amoxicillin Rash    Prescriptions prior to admission  Medication Sig Dispense Refill  . celecoxib (CELEBREX) 100 MG capsule Take 1-2 capsules (100-200 mg total) by mouth 2 (two) times daily.  60 capsule  6  . ciprofloxacin (CIPRO) 500 MG tablet Take 1 tablet (500 mg total) by mouth 2 (two) times daily.  14 tablet  0  . gabapentin (NEURONTIN) 600 MG tablet Take 1 tablet (600 mg total) by mouth at bedtime.  30 tablet  3  . levonorgestrel-ethinyl estradiol (SEASONALE) 0.15-0.03 MG tablet Take 1 tablet by mouth daily.  1 Package  11  . naproxen sodium (ANAPROX) 220 MG tablet Take 440 mg by mouth daily as needed (For pain.).       Marland Kitchen oxyCODONE-acetaminophen (PERCOCET/ROXICET)  5-325 MG per tablet Take 2 tablets by mouth every 4 (four) hours as needed for severe pain.  15 tablet  0  . traMADol (ULTRAM) 50 MG tablet Take 50 mg by mouth every 6 (six) hours as needed for moderate pain or severe pain.      . Vortioxetine HBr (BRINTELLIX) 10 MG TABS Take 1 tablet (10 mg total) by mouth daily.  30 tablet  3    Review of Systems  Gastrointestinal: Positive for nausea, vomiting and abdominal pain.   Physical Exam   Blood pressure 114/79, pulse 113, temperature 98.5 F (36.9 C), resp. rate 20, height 5\' 2"  (1.575 m), weight 105 lb 12.8 oz (47.991 kg), last menstrual period 12/21/2013, SpO2 100.00%.  Physical Exam  Constitutional: She is oriented to person, place, and time. She appears well-developed and well-nourished. No distress.  Pt has full make up on including heavy, glittering eye shadow  HENT:  Head: Normocephalic and atraumatic.  Eyes: Pupils are equal, round, and reactive to light.  Cardiovascular: Normal rate, regular rhythm and normal heart sounds.   Respiratory: Effort normal and breath sounds normal.  GI: Soft. Bowel sounds are normal.  Musculoskeletal: Normal range of motion.  Neurological: She is alert and oriented to person, place, and time.  Psychiatric: She has a normal mood and affect.  Her behavior is normal. Judgment and thought content normal.   Results for orders placed during the hospital encounter of 12/31/13 (from the past 24 hour(s))  URINALYSIS, ROUTINE W REFLEX MICROSCOPIC     Status: Abnormal   Collection Time    12/31/13  8:37 PM      Result Value Ref Range   Color, Urine YELLOW  YELLOW   APPearance CLOUDY (*) CLEAR   Specific Gravity, Urine 1.020  1.005 - 1.030   pH 8.5 (*) 5.0 - 8.0   Glucose, UA NEGATIVE  NEGATIVE mg/dL   Hgb urine dipstick TRACE (*) NEGATIVE   Bilirubin Urine NEGATIVE  NEGATIVE   Ketones, ur NEGATIVE  NEGATIVE mg/dL   Protein, ur NEGATIVE  NEGATIVE mg/dL   Urobilinogen, UA 0.2  0.0 - 1.0 mg/dL   Nitrite  NEGATIVE  NEGATIVE   Leukocytes, UA TRACE (*) NEGATIVE  URINE MICROSCOPIC-ADD ON     Status: Abnormal   Collection Time    12/31/13  8:37 PM      Result Value Ref Range   Squamous Epithelial / LPF MANY (*) RARE   WBC, UA 0-2  <3 WBC/hpf   RBC / HPF 0-2  <3 RBC/hpf   Bacteria, UA MANY (*) RARE  POCT PREGNANCY, URINE     Status: None   Collection Time    12/31/13  8:46 PM      Result Value Ref Range   Preg Test, Ur NEGATIVE  NEGATIVE     MAU Course  Procedures  MDM  Phenergan 25 mg IM  Assessment and Plan   A: Nausea and vomiting with pain medications  P: Phenergan 25 mg po q 6 hours prn Follow up with clinic on Tuesday  Carolynn ServeBarefoot, Adana Marik Miller 12/31/2013, 10:46 PM

## 2013-12-31 NOTE — Telephone Encounter (Signed)
Pt left message stating that she has surgery scheduled on 3/31 for endometriosis. Her pain is worse than normal and she cannot sleep. She requests call back. I returned pt's call and left a message that I was hoping to speak with her before the clinic closes today @ 1200.  I also stated that if her pain is not controlled by the Percocet that she was prescribed 2 days ago, or if she needs a refill she will need to return to MAU for a new evaluation. If she gets this message within the next 10 min, she may call back to the clinic and speak with me.

## 2013-12-31 NOTE — Discharge Instructions (Signed)

## 2014-01-02 ENCOUNTER — Other Ambulatory Visit: Payer: Self-pay | Admitting: Obstetrics & Gynecology

## 2014-01-03 ENCOUNTER — Telehealth: Payer: Self-pay | Admitting: General Practice

## 2014-01-03 NOTE — Telephone Encounter (Signed)
Patient called and left message stating she missed a call from us and is calling us back. Per chart review patient went to MAU over the weekend. Called patient, no answer- left message that we are trying to return her phone call from Friday and if you still need assistance or have questions you can call us back at the clinics

## 2014-01-04 ENCOUNTER — Ambulatory Visit (HOSPITAL_COMMUNITY): Payer: 59 | Admitting: Certified Registered Nurse Anesthetist

## 2014-01-04 ENCOUNTER — Encounter (HOSPITAL_COMMUNITY)
Admission: RE | Disposition: A | Discharge status: Home / Self Care | Payer: Self-pay | Source: Ambulatory Visit | Attending: Obstetrics & Gynecology

## 2014-01-04 ENCOUNTER — Other Ambulatory Visit: Payer: Self-pay | Admitting: Advanced Practice Midwife

## 2014-01-04 ENCOUNTER — Encounter (HOSPITAL_COMMUNITY): Payer: Self-pay | Admitting: Anesthesiology

## 2014-01-04 ENCOUNTER — Encounter (HOSPITAL_COMMUNITY): Payer: 59 | Admitting: Certified Registered Nurse Anesthetist

## 2014-01-04 ENCOUNTER — Ambulatory Visit (HOSPITAL_COMMUNITY)
Admission: RE | Admit: 2014-01-04 | Discharge: 2014-01-04 | Disposition: A | Discharge status: Home / Self Care | Payer: 59 | Source: Ambulatory Visit | Attending: Obstetrics & Gynecology | Admitting: Obstetrics & Gynecology

## 2014-01-04 DIAGNOSIS — R102 Pelvic and perineal pain: Secondary | ICD-10-CM

## 2014-01-04 DIAGNOSIS — N809 Endometriosis, unspecified: Secondary | ICD-10-CM

## 2014-01-04 DIAGNOSIS — N949 Unspecified condition associated with female genital organs and menstrual cycle: Secondary | ICD-10-CM | POA: Insufficient documentation

## 2014-01-04 DIAGNOSIS — N803 Endometriosis of pelvic peritoneum, unspecified: Secondary | ICD-10-CM | POA: Insufficient documentation

## 2014-01-04 DIAGNOSIS — F411 Generalized anxiety disorder: Secondary | ICD-10-CM | POA: Insufficient documentation

## 2014-01-04 DIAGNOSIS — Z8742 Personal history of other diseases of the female genital tract: Secondary | ICD-10-CM | POA: Insufficient documentation

## 2014-01-04 DIAGNOSIS — Z87891 Personal history of nicotine dependence: Secondary | ICD-10-CM | POA: Insufficient documentation

## 2014-01-04 DIAGNOSIS — G8929 Other chronic pain: Secondary | ICD-10-CM | POA: Insufficient documentation

## 2014-01-04 HISTORY — PX: LAPAROSCOPY: SHX197

## 2014-01-04 LAB — CBC
HEMATOCRIT: 38.4 % (ref 36.0–46.0)
Hemoglobin: 13.6 g/dL (ref 12.0–15.0)
MCH: 32.9 pg (ref 26.0–34.0)
MCHC: 35.4 g/dL (ref 30.0–36.0)
MCV: 93 fL (ref 78.0–100.0)
Platelets: 214 10*3/uL (ref 150–400)
RBC: 4.13 MIL/uL (ref 3.87–5.11)
RDW: 12.6 % (ref 11.5–15.5)
WBC: 8.2 10*3/uL (ref 4.0–10.5)

## 2014-01-04 LAB — PREGNANCY, URINE: Preg Test, Ur: NEGATIVE

## 2014-01-04 SURGERY — LAPAROSCOPY, DIAGNOSTIC
Anesthesia: General | Site: Abdomen

## 2014-01-04 MED ORDER — MIDAZOLAM HCL 2 MG/2ML IJ SOLN
INTRAMUSCULAR | Status: DC | PRN
  Administered 2014-01-04 (×2): 1 mg via INTRAVENOUS

## 2014-01-04 MED ORDER — LIDOCAINE HCL (CARDIAC) 20 MG/ML IV SOLN
INTRAVENOUS | Status: DC | PRN
  Administered 2014-01-04: 60 mg via INTRAVENOUS

## 2014-01-04 MED ORDER — BUPIVACAINE HCL (PF) 0.25 % IJ SOLN
INTRAMUSCULAR | Status: AC
  Filled 2014-01-04: qty 30

## 2014-01-04 MED ORDER — DEXAMETHASONE SODIUM PHOSPHATE 10 MG/ML IJ SOLN
INTRAMUSCULAR | Status: DC | PRN
  Administered 2014-01-04: 6 mg via INTRAVENOUS

## 2014-01-04 MED ORDER — BUPIVACAINE HCL (PF) 0.25 % IJ SOLN
INTRAMUSCULAR | Status: DC | PRN
  Administered 2014-01-04: 6 mL

## 2014-01-04 MED ORDER — MIDAZOLAM HCL 2 MG/2ML IJ SOLN
INTRAMUSCULAR | Status: AC
  Filled 2014-01-04: qty 2

## 2014-01-04 MED ORDER — FENTANYL CITRATE 0.05 MG/ML IJ SOLN
INTRAMUSCULAR | Status: AC
  Filled 2014-01-04: qty 2

## 2014-01-04 MED ORDER — PROPOFOL 10 MG/ML IV BOLUS
INTRAVENOUS | Status: DC | PRN
  Administered 2014-01-04: 150 mg via INTRAVENOUS

## 2014-01-04 MED ORDER — OXYCODONE-ACETAMINOPHEN 5-325 MG PO TABS
ORAL_TABLET | ORAL | Status: AC
  Filled 2014-01-04: qty 1

## 2014-01-04 MED ORDER — LACTATED RINGERS IV SOLN
INTRAVENOUS | Status: DC
  Administered 2014-01-04 (×2): via INTRAVENOUS

## 2014-01-04 MED ORDER — GLYCOPYRROLATE 0.2 MG/ML IJ SOLN
INTRAMUSCULAR | Status: DC | PRN
  Administered 2014-01-04 (×2): 0.2 mg via INTRAVENOUS

## 2014-01-04 MED ORDER — SODIUM CHLORIDE 0.9 % IJ SOLN
INTRAMUSCULAR | Status: AC
  Filled 2014-01-04: qty 10

## 2014-01-04 MED ORDER — PROPOFOL 10 MG/ML IV EMUL
INTRAVENOUS | Status: AC
  Filled 2014-01-04: qty 20

## 2014-01-04 MED ORDER — OXYCODONE-ACETAMINOPHEN 5-325 MG PO TABS: 1.0000 | ORAL_TABLET | ORAL | Status: DC | PRN

## 2014-01-04 MED ORDER — KETOROLAC TROMETHAMINE 30 MG/ML IJ SOLN
INTRAMUSCULAR | Status: AC
  Filled 2014-01-04: qty 1

## 2014-01-04 MED ORDER — KETOROLAC TROMETHAMINE 30 MG/ML IJ SOLN
INTRAMUSCULAR | Status: DC | PRN
  Administered 2014-01-04: 30 mg via INTRAVENOUS

## 2014-01-04 MED ORDER — MEPERIDINE HCL 25 MG/ML IJ SOLN: 6.2500 mg | INTRAMUSCULAR | Status: DC | PRN

## 2014-01-04 MED ORDER — LIDOCAINE HCL (CARDIAC) 20 MG/ML IV SOLN
INTRAVENOUS | Status: AC
  Filled 2014-01-04: qty 5

## 2014-01-04 MED ORDER — PROMETHAZINE HCL 25 MG/ML IJ SOLN: 6.2500 mg | INTRAMUSCULAR | Status: DC | PRN

## 2014-01-04 MED ORDER — FENTANYL CITRATE 0.05 MG/ML IJ SOLN
INTRAMUSCULAR | Status: DC | PRN
  Administered 2014-01-04: 100 ug via INTRAVENOUS
  Administered 2014-01-04 (×3): 50 ug via INTRAVENOUS

## 2014-01-04 MED ORDER — ONDANSETRON HCL 4 MG/2ML IJ SOLN
INTRAMUSCULAR | Status: DC | PRN
  Administered 2014-01-04: 4 mg via INTRAVENOUS

## 2014-01-04 MED ORDER — NEOSTIGMINE METHYLSULFATE 1 MG/ML IJ SOLN
INTRAMUSCULAR | Status: DC | PRN
  Administered 2014-01-04: 2 mg via INTRAVENOUS

## 2014-01-04 MED ORDER — ROCURONIUM BROMIDE 100 MG/10ML IV SOLN
INTRAVENOUS | Status: DC | PRN
  Administered 2014-01-04: 25 mg via INTRAVENOUS

## 2014-01-04 MED ORDER — FENTANYL CITRATE 0.05 MG/ML IJ SOLN
25.0000 ug | INTRAMUSCULAR | Status: DC | PRN
  Administered 2014-01-04: 50 ug via INTRAVENOUS
  Administered 2014-01-04: 25 ug via INTRAVENOUS
  Administered 2014-01-04: 50 ug via INTRAVENOUS

## 2014-01-04 MED ORDER — OXYCODONE-ACETAMINOPHEN 5-325 MG PO TABS
1.0000 | ORAL_TABLET | Freq: Once | ORAL | Status: AC
  Administered 2014-01-04: 1 via ORAL

## 2014-01-04 MED ORDER — DEXAMETHASONE SODIUM PHOSPHATE 10 MG/ML IJ SOLN
INTRAMUSCULAR | Status: AC
  Filled 2014-01-04: qty 1

## 2014-01-04 MED ORDER — FENTANYL CITRATE 0.05 MG/ML IJ SOLN
INTRAMUSCULAR | Status: AC
  Filled 2014-01-04: qty 5

## 2014-01-04 MED ORDER — ROCURONIUM BROMIDE 100 MG/10ML IV SOLN
INTRAVENOUS | Status: AC
  Filled 2014-01-04: qty 1

## 2014-01-04 MED ORDER — ONDANSETRON HCL 4 MG/2ML IJ SOLN
INTRAMUSCULAR | Status: AC
  Filled 2014-01-04: qty 2

## 2014-01-04 SURGICAL SUPPLY — 30 items
CABLE HIGH FREQUENCY MONO STRZ (ELECTRODE) IMPLANT
CATH ROBINSON RED A/P 16FR (CATHETERS) ×3 IMPLANT
CHLORAPREP W/TINT 26ML (MISCELLANEOUS) ×3 IMPLANT
CLOSURE WOUND 1/2 X4 (GAUZE/BANDAGES/DRESSINGS)
CLOTH BEACON ORANGE TIMEOUT ST (SAFETY) ×3 IMPLANT
DERMABOND ADHESIVE PROPEN (GAUZE/BANDAGES/DRESSINGS) ×2
DERMABOND ADVANCED (GAUZE/BANDAGES/DRESSINGS)
DERMABOND ADVANCED .7 DNX12 (GAUZE/BANDAGES/DRESSINGS) IMPLANT
DERMABOND ADVANCED .7 DNX6 (GAUZE/BANDAGES/DRESSINGS) ×1 IMPLANT
GLOVE BIO SURGEON STRL SZ 6.5 (GLOVE) ×2 IMPLANT
GLOVE BIO SURGEONS STRL SZ 6.5 (GLOVE) ×1
GLOVE BIOGEL PI IND STRL 7.0 (GLOVE) ×1 IMPLANT
GLOVE BIOGEL PI INDICATOR 7.0 (GLOVE) ×2
GOWN STRL REUS W/TWL LRG LVL3 (GOWN DISPOSABLE) ×6 IMPLANT
NEEDLE INSUFFLATION 120MM (ENDOMECHANICALS) IMPLANT
NS IRRIG 1000ML POUR BTL (IV SOLUTION) ×3 IMPLANT
PACK LAPAROSCOPY BASIN (CUSTOM PROCEDURE TRAY) ×3 IMPLANT
PROTECTOR NERVE ULNAR (MISCELLANEOUS) ×3 IMPLANT
SCRUB TECHNI CARE 4 OZ NO DYE (MISCELLANEOUS) ×3 IMPLANT
SET IRRIG TUBING LAPAROSCOPIC (IRRIGATION / IRRIGATOR) IMPLANT
SHEARS HARMONIC ACE PLUS 36CM (ENDOMECHANICALS) IMPLANT
STRIP CLOSURE SKIN 1/2X4 (GAUZE/BANDAGES/DRESSINGS) IMPLANT
SUT VICRYL 0 UR6 27IN ABS (SUTURE) ×3 IMPLANT
SUT VICRYL 4-0 PS2 18IN ABS (SUTURE) ×3 IMPLANT
TOWEL OR 17X24 6PK STRL BLUE (TOWEL DISPOSABLE) ×6 IMPLANT
TRAY FOLEY BAG SILVER LF 16FR (CATHETERS) ×3 IMPLANT
TROCAR XCEL DIL TIP R 11M (ENDOMECHANICALS) ×3 IMPLANT
TROCAR XCEL NON-BLD 5MMX100MML (ENDOMECHANICALS) ×3 IMPLANT
WARMER LAPAROSCOPE (MISCELLANEOUS) ×3 IMPLANT
WATER STERILE IRR 1000ML POUR (IV SOLUTION) ×3 IMPLANT

## 2014-01-04 NOTE — H&P (Signed)
Patient ID: Jenna Blake, female   DOB: 06/09/1991, 23 y.o.   MRN: [id number]    Chief Complaint   Patient presents with   Pelvic pain from endometriosis   G0P0000  Patient's last menstrual period was 12/21/2013.  Continues to have pelvic pain, was seen in MAU 12/22/13 and was given Ultram 30 tabs, take this 3-4 times a day. Offer repeat dx laparoscopy, which she requests She may still have pai afterward, and will recommend Lupron depot afterward,.The procedure and the risk of anesthesia, bleeding, infection, bowel and bladder injury were discussed and her questions were answered.        HPI     Past Medical History   Diagnosis  Date   .  Endometriosis     .  Depression         Diagnosed at age 3         Past Surgical History   Procedure  Laterality  Date   .  Cholecystectomy       .  Tonsillectomy       .  Laparoscopic endometriosis fulguration             Family History   Problem  Relation  Age of Onset   .  Hypertension  Father     .  Diabetes  Maternal Grandmother          Social History History   Substance Use Topics   .  Smoking status:  Former Smoker -- 0.50 packs/day       Types:  Cigarettes   .  Smokeless tobacco:  Never Used   .  Alcohol Use:  No         Comment: Former user         Allergies   Allergen  Reactions   .  Ativan [Lorazepam]  Other (See Comments)       Makes her feel crazy   .  Betadine [Povidone Iodine]  Hives   .  Reglan [Metoclopramide]  Other (See Comments)       "bad thoughts, makes me feel weird."   .  Amoxicillin  Rash         Current Outpatient Prescriptions   Medication  Sig  Dispense  Refill   .  levonorgestrel-ethinyl estradiol (SEASONALE) 0.15-0.03 MG tablet  Take 1 tablet by mouth daily.   1 Package   11   .  naproxen (NAPROSYN) 500 MG tablet  Take 1 tablet (500 mg total) by mouth 2 (two) times daily.   20 tablet   0   .  sulfamethoxazole-trimethoprim (BACTRIM DS) 800-160 MG per tablet  Take 1 tablet by mouth  2 (two) times daily.   20 tablet   0   .  traMADol (ULTRAM) 50 MG tablet  Take 1 tablet (50 mg total) by mouth every 6 (six) hours as needed.   15 tablet   0   .  Vortioxetine HBr (BRINTELLIX) 10 MG TABS  Take 1 tablet (10 mg total) by mouth daily.   30 tablet   3   .  gabapentin (NEURONTIN) 600 MG tablet  Take 1 tablet (600 mg total) by mouth at bedtime.   30 tablet   3   .  promethazine-dextromethorphan (PROMETHAZINE-DM) 6.25-15 MG/5ML syrup  Take 1-2 Tsp every 4-6 hours as needed for cough.   240 mL   0       No current facility-administered medications for this visit.  Review of Systems Review of Systems  Constitutional: Negative for fever.  Genitourinary: Positive for menstrual problem and pelvic pain. Negative for vaginal discharge.      Filed Vitals:   01/04/14 1204  BP: 118/63  Pulse: 83  Temp: 97.9 F (36.6 C)  Resp: 20      Physical Exam Physical Exam  Constitutional: She appears well-developed. No distress.  Pulmonary/Chest: Effort normal. No respiratory distress.  Skin: Skin is warm and dry.  Psychiatric: She has a normal mood and affect. Her behavior is normal.      Data Reviewed Office notes CBC    Component Value Date/Time   WBC 8.2 01/04/2014 1200   RBC 4.13 01/04/2014 1200   HGB 13.6 01/04/2014 1200   HCT 38.4 01/04/2014 1200   PLT 214 01/04/2014 1200   MCV 93.0 01/04/2014 1200   MCH 32.9 01/04/2014 1200   MCHC 35.4 01/04/2014 1200   RDW 12.6 01/04/2014 1200   LYMPHSABS 2.8 10/26/2013 2300   MONOABS 0.5 10/26/2013 2300   EOSABS 0.1 10/26/2013 2300   BASOSABS 0.0 10/26/2013 2300      Assessment    Pelvic pain, endometriosis      Plan    Diagnostic laparoscopy, possible fulguration of endometriosis          Jenna Blake 01/04/2014 1:31 PM

## 2014-01-04 NOTE — Anesthesia Preprocedure Evaluation (Signed)
Anesthesia Evaluation  Patient identified by MRN, date of birth, ID band Patient awake    Reviewed: Allergy & Precautions, H&P , NPO status , Patient's Chart, lab work & pertinent test results  Airway Mallampati: II TM Distance: >3 FB Neck ROM: Full    Dental no notable dental hx. (+) Teeth Intact   Pulmonary neg pulmonary ROS, former smoker,  breath sounds clear to auscultation  Pulmonary exam normal       Cardiovascular negative cardio ROS  Rhythm:Regular Rate:Normal     Neuro/Psych PSYCHIATRIC DISORDERS Anxiety Depression negative neurological ROS     GI/Hepatic negative GI ROS, Neg liver ROS,   Endo/Other  negative endocrine ROS  Renal/GU negative Renal ROS  negative genitourinary   Musculoskeletal negative musculoskeletal ROS (+)   Abdominal   Peds  Hematology negative hematology ROS (+)   Anesthesia Other Findings   Reproductive/Obstetrics Chronic pelvic pain Endometriosis                           Anesthesia Physical Anesthesia Plan  ASA: II  Anesthesia Plan: General   Post-op Pain Management:    Induction: Intravenous  Airway Management Planned: Oral ETT  Additional Equipment:   Intra-op Plan:   Post-operative Plan: Extubation in OR  Informed Consent: I have reviewed the patients History and Physical, chart, labs and discussed the procedure including the risks, benefits and alternatives for the proposed anesthesia with the patient or authorized representative who has indicated his/her understanding and acceptance.   Dental advisory given  Plan Discussed with: Anesthesiologist, CRNA and Surgeon  Anesthesia Plan Comments:         Anesthesia Quick Evaluation

## 2014-01-04 NOTE — Anesthesia Postprocedure Evaluation (Signed)
  Anesthesia Post-op Note  Patient: Jenna Blake  Procedure(s) Performed: Procedure(s): LAPAROSCOPY DIAGNOSTIC (N/A) Patient is awake and responsive. Pain and nausea are reasonably well controlled. Vital signs are stable and clinically acceptable. Oxygen saturation is clinically acceptable. There are no apparent anesthetic complications at this time. Patient is ready for discharge.

## 2014-01-04 NOTE — Transfer of Care (Signed)
Immediate Anesthesia Transfer of Care Note  Patient: Jenna Blake  Procedure(s) Performed: Procedure(s): LAPAROSCOPY DIAGNOSTIC (N/A)  Patient Location: PACU  Anesthesia Type:General  Level of Consciousness: awake, alert  and oriented  Airway & Oxygen Therapy: Patient Spontanous Breathing and Patient connected to nasal cannula oxygen  Post-op Assessment: Report given to PACU RN and Post -op Vital signs reviewed and stable  Post vital signs: Reviewed and stable  Complications: No apparent anesthesia complications

## 2014-01-04 NOTE — Op Note (Signed)
Diagnostic Laparoscopy Procedure Note  Indications: The patient is a 23 y.o. female with chronic pelvic pain and history of endometriosis.  Pre-operative Diagnosis: Chronic pelvic pain and history of endometriosis  Post-operative Diagnosis: Same  Surgeon: Takeesha Isley   Assistants: None  Anesthesia: General endotracheal anesthesia  ASA Class: 1  Procedure Details  The patient was seen in the Holding Room. The risks, benefits, complications, treatment options, and expected outcomes were discussed with the patient. The possibilities of reaction to medication, pulmonary aspiration, perforation of viscus, bleeding, recurrent infection, the need for additional procedures, failure to diagnose a condition, and creating a complication requiring transfusion or operation were discussed with the patient. The patient concurred with the proposed plan, giving informed consent. The patient was taken to the Operating Room, identified as Jenna Blake and the procedure verified as Diagnostic Laparoscopy. A Time Out was held and the above information confirmed.  After induction of general anesthesia, the patient was placed in modified dorsal lithotomy position where she was prepped, draped, and catheterized in the normal, sterile fashion.  The cervix was visualized and an intrauterine manipulator was placed. A 1-1/2 cm umbilical incision was then performed. Veress needle was passed and pneumoperitoneum was established. The above findings were noted.  Panoramic view the pelvis was achieved. The uterus was manipulated with the Hulka tenaculum. Both adnexa and the cul-de-sac and anterior peritoneum were inspected. There was a peritoneal window seen on the lower uterine segment anteriorly which seemed to represent changers from endometriosis. In the cul-de-sac there was endometriosis implant and a peritoneal window in the left uterosacral ligament. This was coagulated with cautery using endoscopic scissors. Both  ureters were inspected and were normal. The tubes and ovaries appeared normal. There were no adhesions in the pelvis.  Following the procedure the umbilical sheath was removed after intra-abdominal carbon dioxide was expressed. The incision was closed with a fascial suture with 0 Vicryl and subcuticular sutures of 4-0 Vicryl. Dermabond was applied at the skin The intrauterine manipulator was then removed.  Instrument, sponge, and needle counts were correct prior to abdominal closure and at the conclusion of the case.   Findings: The anterior cul-de-sac and round ligaments peritoneal window and anterior lower uterine segment The uterus normal The adnexa normal Cul-de-sac endometriosis at the left uterosacral ligament  Estimated Blood Loss:  Minimal         Drains: Foley catheter intraoperatively that was removed postop         Total IV Fluids: 500 mL          Specimens: None              Complications:  None; patient tolerated the procedure well.         Disposition: PACU - hemodynamically stable.         Condition: stable  Jenna PhenixJames G Arafat Cocuzza, MD 01/04/2014 3:03 PM

## 2014-01-04 NOTE — Discharge Instructions (Signed)
Diagnostic Laparoscopy Laparoscopy is a surgical procedure. It is used to diagnose and treat diseases inside the belly (abdomen). It is usually a brief, common, and relatively simple procedure. The laparoscopeis a thin, lighted, pencil-sized instrument. It is like a telescope. It is inserted into your abdomen through a small cut (incision). Your caregiver can look at the organs inside your body through this instrument. He or she can see if there is anything abnormal. Laparoscopy can be done either in a hospital or outpatient clinic. You may be given a mild sedative to help you relax before the procedure. Once in the operating room, you will be given a drug to make you sleep (general anesthesia). Laparoscopy usually lasts less than 1 hour. After the procedure, you will be monitored in a recovery area until you are stable and doing well. Once you are home, it will take 2 to 3 days to fully recover. RISKS AND COMPLICATIONS  Laparoscopy has relatively few risks. Your caregiver will discuss the risks with you before the procedure. Some problems that can occur include:  Infection.  Bleeding.  Damage to other organs.  Anesthetic side effects. PROCEDURE Once you receive anesthesia, your surgeon inflates the abdomen with a harmless gas (carbon dioxide). This makes the organs easier to see. The laparoscope is inserted into the abdomen through a small incision. This allows your surgeon to see into the abdomen. Other small instruments are also inserted into the abdomen through other small openings. Many surgeons attach a video camera to the laparoscope to enlarge the view. During a diagnostic laparoscopy, the surgeon may be looking for inflammation, infection, or cancer. Your surgeon may take tissue samples(biopsies). The samples are sent to a specialist in looking at cells and tissue samples (pathologist). The pathologist examines them under a microscope. Biopsies can help to diagnose or confirm a  disease. AFTER THE PROCEDURE   The gas is released from inside the abdomen.  The incisions are closed with stitches (sutures). Because these incisions are small (usually less than 1/2 inch), there is usually minimal discomfort after the procedure. There may be some mild discomfort in the throat. This is from the tube placed in the throat while you were sleeping. You may have some mild abdominal discomfort. There may also be discomfort from the instrument placement incisions in the abdomen.  The recovery time is shortened as long as there are no complications.  You will rest in a recovery room until stable and doing well. As long as there are no complications, you may be allowed to go home. FINDING OUT THE RESULTS OF YOUR TEST Not all test results are available during your visit. If your test results are not back during the visit, make an appointment with your caregiver to find out the results. Do not assume everything is normal if you have not heard from your caregiver or the medical facility. It is important for you to follow up on all of your test results. HOME CARE INSTRUCTIONS   Take all medicines as directed.  Only take over-the-counter or prescription medicines for pain, discomfort, or fever as directed by your caregiver.  Resume daily activities as directed.  Showers are preferred over baths.  You may resume sexual activities in 1 week or as directed.  Do not drive while taking narcotics. SEEK MEDICAL CARE IF:   There is increasing abdominal pain.  There is new pain in the shoulders (shoulder strap areas).  You feel lightheaded or faint.  You have the chills.  You or  your child has an oral temperature above 102° F (38.9° C). °· There is pus-like (purulent) drainage from any of the wounds. °· You are unable to pass gas or have a bowel movement. °· You feel sick to your stomach (nauseous) or throw up (vomit). °MAKE SURE YOU:  °· Understand these instructions. °· Will watch  your condition. °· Will get help right away if you are not doing well or get worse. °Document Released: 12/30/2000 Document Revised: 01/18/2013 Document Reviewed: 09/23/2007 °[employer]® Patient Information ©2014 [employer]. ° °Post Anesthesia Home Care Instructions ° °Activity: °Get plenty of rest for the remainder of the day. A responsible adult should stay with you for 24 hours following the procedure.  °For the next 24 hours, DO NOT: °-Drive a car °-Operate machinery °-Drink alcoholic beverages °-Take any medication unless instructed by your physician °-Make any legal decisions or sign important papers. ° °Meals: °Start with liquid foods such as gelatin or soup. Progress to regular foods as tolerated. Avoid greasy, spicy, heavy foods. If nausea and/or vomiting occur, drink only clear liquids until the nausea and/or vomiting subsides. Call your physician if vomiting continues. ° °Special Instructions/Symptoms: °Your throat may feel dry or sore from the anesthesia or the breathing tube placed in your throat during surgery. If this causes discomfort, gargle with warm salt water. The discomfort should disappear within 24 hours. ° °

## 2014-01-05 ENCOUNTER — Encounter (HOSPITAL_COMMUNITY): Payer: Self-pay | Admitting: Obstetrics & Gynecology

## 2014-01-07 ENCOUNTER — Encounter (HOSPITAL_COMMUNITY): Payer: Self-pay | Admitting: *Deleted

## 2014-01-07 ENCOUNTER — Inpatient Hospital Stay (HOSPITAL_COMMUNITY)
Admission: AD | Admit: 2014-01-07 | Discharge: 2014-01-07 | Disposition: A | Discharge status: Home / Self Care | Payer: 59 | Source: Ambulatory Visit | Attending: Obstetrics and Gynecology | Admitting: Obstetrics and Gynecology

## 2014-01-07 DIAGNOSIS — Z87891 Personal history of nicotine dependence: Secondary | ICD-10-CM | POA: Insufficient documentation

## 2014-01-07 DIAGNOSIS — F329 Major depressive disorder, single episode, unspecified: Secondary | ICD-10-CM | POA: Insufficient documentation

## 2014-01-07 DIAGNOSIS — F3289 Other specified depressive episodes: Secondary | ICD-10-CM | POA: Insufficient documentation

## 2014-01-07 DIAGNOSIS — G8918 Other acute postprocedural pain: Secondary | ICD-10-CM

## 2014-01-07 LAB — URINALYSIS, ROUTINE W REFLEX MICROSCOPIC
Bilirubin Urine: NEGATIVE
GLUCOSE, UA: NEGATIVE mg/dL
Ketones, ur: NEGATIVE mg/dL
Leukocytes, UA: NEGATIVE
NITRITE: NEGATIVE
PROTEIN: NEGATIVE mg/dL
Specific Gravity, Urine: 1.02 (ref 1.005–1.030)
Urobilinogen, UA: 0.2 mg/dL (ref 0.0–1.0)
pH: 7 (ref 5.0–8.0)

## 2014-01-07 LAB — CBC
HEMATOCRIT: 34.8 % — AB (ref 36.0–46.0)
Hemoglobin: 12.4 g/dL (ref 12.0–15.0)
MCH: 32.5 pg (ref 26.0–34.0)
MCHC: 35.6 g/dL (ref 30.0–36.0)
MCV: 91.1 fL (ref 78.0–100.0)
PLATELETS: 194 10*3/uL (ref 150–400)
RBC: 3.82 MIL/uL — ABNORMAL LOW (ref 3.87–5.11)
RDW: 12.2 % (ref 11.5–15.5)
WBC: 8.2 10*3/uL (ref 4.0–10.5)

## 2014-01-07 LAB — URINE MICROSCOPIC-ADD ON

## 2014-01-07 MED ORDER — KETOROLAC TROMETHAMINE 60 MG/2ML IM SOLN
60.0000 mg | Freq: Once | INTRAMUSCULAR | Status: DC
  Filled 2014-01-07: qty 2

## 2014-01-07 MED ORDER — OXYCODONE-ACETAMINOPHEN 5-325 MG PO TABS: 1.0000 | ORAL_TABLET | ORAL | Status: DC | PRN

## 2014-01-07 NOTE — MAU Provider Note (Signed)
History See the hx in the note of Jenna Blake, CNM. The patient had a temp to 101 at home, vomited x 1, and felt the incision" Popped"   she had regular BM this morning, and is generally sensitive all over abdomen , no rebound, but mainly sore around the incision , worse with movement.  CSN: [id number]  Arrival date and time: 01/07/14 2131   First Provider Initiated Contact with Patient 01/07/14 2215      Chief Complaint  Patient presents with  . Post-op Problem   HPI  Pertinent Gynecological History: S/p Laparoscopy 3 days PTA, a single incision laparoscopy for endometriosis. Pt's endometriosis complaints had become all the time, not just at menses.  Past Medical History  Diagnosis Date  . Endometriosis   . Depression     Diagnosed at age 22    Past Surgical History  Procedure Laterality Date  . Cholecystectomy    . Tonsillectomy    . Laparoscopic endometriosis fulguration    . Laparoscopy N/A 01/04/2014    Procedure: LAPAROSCOPY DIAGNOSTIC;  Surgeon: Adam Phenix, MD;  Location: [facility];  Service: Gynecology;  Laterality: N/A;    Family History  Problem Relation Age of Onset  . Hypertension Father   . Diabetes Maternal Grandmother     History  Substance Use Topics  . Smoking status: Former Smoker -- 0.50 packs/day    Types: Cigarettes  . Smokeless tobacco: Never Used  . Alcohol Use: No     Comment: Former user    Allergies:  Allergies  Allergen Reactions  . Ativan [Lorazepam] Other (See Comments)    Makes her feel crazy  . Betadine [Povidone Iodine] Hives  . Reglan [Metoclopramide] Other (See Comments)    "bad thoughts, makes me feel weird."  . Amoxicillin Rash    Prescriptions prior to admission  Medication Sig Dispense Refill  . celecoxib (CELEBREX) 100 MG capsule Take 1-2 capsules (100-200 mg total) by mouth 2 (two) times daily.  60 capsule  6  . gabapentin (NEURONTIN) 600 MG tablet Take 1 tablet (600 mg total) by mouth at bedtime.  30 tablet  3   . levonorgestrel-ethinyl estradiol (SEASONALE) 0.15-0.03 MG tablet Take 1 tablet by mouth daily.  1 Package  11  . naproxen sodium (ANAPROX) 220 MG tablet Take 440 mg by mouth daily as needed (For pain.).       Marland Kitchen promethazine (PHENERGAN) 25 MG tablet Take 1 tablet (25 mg total) by mouth every 6 (six) hours as needed for nausea or vomiting.  30 tablet  0  . traMADol (ULTRAM) 50 MG tablet Take 50 mg by mouth every 6 (six) hours as needed for moderate pain or severe pain.      . Vortioxetine HBr (BRINTELLIX) 10 MG TABS Take 1 tablet (10 mg total) by mouth daily.  30 tablet  3  . [DISCONTINUED] oxyCODONE-acetaminophen (PERCOCET/ROXICET) 5-325 MG per tablet Take 1-2 tablets by mouth every 4 (four) hours as needed for severe pain.  15 tablet  0    ROS Physical Exam   Blood pressure 117/60, pulse 104, temperature 98 F (36.7 C), resp. rate 20, height 5\' 5"  (1.651 m), weight 48.081 kg (106 lb), last menstrual period 12/21/2013.  Physical Exam  Constitutional: She is oriented to person, place, and time. She appears well-developed and well-nourished.  HENT:  Head: Normocephalic.  Eyes: Pupils are equal, round, and reactive to light.  Cardiovascular: Normal rate and regular rhythm.   Respiratory: Effort normal.  GI: Soft.  Bowel sounds are normal. She exhibits no mass. There is tenderness. There is no rebound and no guarding.  Clean 1.5 cm incision thru navel, slight skin separation, edges only.  No hernia or redness or mass.  Bowel sounds normal all r4 quadrants.  Neurological: She is alert and oriented to person, place, and time.  Skin: Skin is warm and dry. No rash noted. No erythema. No pallor.  Psychiatric: She has a normal mood and affect.  Anxious, low pain threshold suspected.    MAU Course  Procedures  MDM Limited exam, review of pain meds  Assessment and Plan  Renew percocet, restart Toradol p.o. Discussed with Jenna Shellerheather Hogan.  Delio Slates V 01/07/2014, 11:39 PM

## 2014-01-07 NOTE — Discharge Instructions (Signed)
Pain Relief Preoperatively and Postoperatively °Being a good patient does not mean being a silent one. If you have questions, problems, or concerns about the pain you may feel after surgery, let your caregiver know. Patients have the right to assessment and management of pain. The treatment of pain after surgery is important to speed up recovery and return to normal activities. Severe pain after surgery, and the fear or anxiety associated with that pain, may cause extreme discomfort that: °· Prevents sleep. °· Decreases the ability to breathe deeply and cough. This can cause pneumonia or other upper airway infections. °· Causes your heart to beat faster and your blood pressure to be higher. °· Increases the risk for constipation and bloating. °· Decreases the ability of wounds to heal. °· May result in depression, increased anxiety, and feelings of helplessness. °Relief of pain before surgery is also important because it will lessen the pain after surgery. Patients who receive both pain relief before and after surgery experience greater pain relief than those who only receive pain relief after surgery. Let your caregiver know if you are having uncontrolled pain. This is very important. Pain after surgery is more difficult to manage if it is permitted to become severe, so prompt and adequate treatment of acute pain is necessary. °PAIN CONTROL METHODS °Your caregivers follow policies and procedures about the management of patient pain. These guidelines should be explained to you before surgery. Plans for pain control after surgery must be mutually decided upon and instituted with your full understanding and agreement. Do not be afraid to ask questions regarding the care you are receiving. There are many different ways your caregivers will attempt to control your pain, including the following methods. °As needed pain control °· You may be given pain medicine either through your intravenous (IV) tube, or as a pill or  liquid you can swallow. You will need to let your caregiver know when you are having pain. Then, your caregiver will give you the pain medicine ordered for you. °· Your pain medicine may make you constipated. If constipation occurs, drink more liquids if you can. Your caregiver may have you take a mild laxative. °IV patient-controlled analgesia pump (PCA pump) °· You can get your pain medicine through the IV tube which goes into your vein. You are able to control the amount of pain medicine that you get. The pain medicine flows in through an IV tube and is controlled by a pump. This pump gives you a set amount of pain medicine when you push the button hooked up to it. Nobody should push this button but you or someone specifically assigned by you to do so. It is set up to keep you from accidentally giving yourself too much pain medicine. You will be able to start using your pain pump in the recovery room after your surgery. This method can be helpful for most types of surgery. °· If you are still having too much pain, tell your caregiver. Also, tell your caregiver if you are feeling too sleepy or nauseous. °Continuous epidural pain control °· A thin, soft tube (catheter) is put into your back. Pain medicine flows through the catheter to lessen pain in the part of your body where the surgery is done. Continuous epidural pain control may work best for you if you are having surgery on your chest, abdomen, hip area, or legs. The epidural catheter is usually put into your back just before surgery. The catheter is left in until you can eat and take medicine by mouth. In most cases,   this may take 2 to 3 days. °· Giving pain medicine through the epidural catheter may help you heal faster because: °· Your bowel gets back to normal faster. °· You can get back to eating sooner. °· You can be up and walking sooner. °Medicine that numbs the area (local anesthetic) °· You may receive an injection of pain medicine near where the  pain is (local infiltration). °· You may receive an injection of pain medicine near the nerve that controls the sensation to a specific part of the body (peripheral nerve block). °· Medicine may be put in the spine to block pain (spinal block). °Opioids °· Moderate to moderately severe acute pain after surgery may respond to opioids. Opioids are narcotic pain medicine. Opioids are often combined with non-narcotic medicines to improve pain relief, diminish the risk of side effects, and reduce the chance of addiction. °· If you follow your caregiver's directions about taking opioids and you do not have a history of substance abuse, your risk of becoming addicted is exceptionally small. Opioids are given for short periods of time in careful doses to prevent addiction. °Other methods of pain control include: °· Steroids. °· Physical therapy. °· Heat and cold therapy. °· Compression, such as wrapping an elastic bandage around the area of pain. °· Massage. °These various ways of controlling pain may be used together. Combining different methods of pain control is called multimodal analgesia. Using this approach has many benefits, including being able to eat, move around, and leave the hospital sooner. °Document Released: 12/14/2002 Document Revised: 12/16/2011 Document Reviewed: 12/18/2010 °Exit[employer]® Patient Information ©2014 [employer]. ° °

## 2014-01-07 NOTE — Progress Notes (Signed)
WRitten and verbal d/c instructions given and understanding voiced. Dr Emelda FearFerguson in earlier and discussed d/c plan

## 2014-01-07 NOTE — MAU Provider Note (Signed)
History     CSN: [id number]  Arrival date and time: 01/07/14 2131   First Provider Initiated Contact with Patient 01/07/14 2215      Chief Complaint  Patient presents with  . Post-op Problem   HPI  Jenna Blake is a 23 y.o. who presents today with pain after surgery. She had a diagnostic lap for endometriosis on 01/04/14. She thought that she would be feeling better by now, but she continues to have pain. She states that the pain is worse than it was right after surgery. She has a FU appointment on 3/16. She rates her current pain 7/10 at this time. She last took pain medication at 1900. She states that the pain medication makes it better when she takes it. She had one episode of vomiting, and she is also worried that she may have torn a stitch. She states that she took her temp at home, and it was 101.   Past Medical History  Diagnosis Date  . Endometriosis   . Depression     Diagnosed at age 23    Past Surgical History  Procedure Laterality Date  . Cholecystectomy    . Tonsillectomy    . Laparoscopic endometriosis fulguration    . Laparoscopy N/A 01/04/2014    Procedure: LAPAROSCOPY DIAGNOSTIC;  Surgeon: Adam PhenixJames G Arnold, MD;  Location: [facility];  Service: Gynecology;  Laterality: N/A;    Family History  Problem Relation Age of Onset  . Hypertension Father   . Diabetes Maternal Grandmother     History  Substance Use Topics  . Smoking status: Former Smoker -- 0.50 packs/day    Types: Cigarettes  . Smokeless tobacco: Never Used  . Alcohol Use: No     Comment: Former user    Allergies:  Allergies  Allergen Reactions  . Ativan [Lorazepam] Other (See Comments)    Makes her feel crazy  . Betadine [Povidone Iodine] Hives  . Reglan [Metoclopramide] Other (See Comments)    "bad thoughts, makes me feel weird."  . Amoxicillin Rash    Prescriptions prior to admission  Medication Sig Dispense Refill  . celecoxib (CELEBREX) 100 MG capsule Take 1-2 capsules (100-200 mg  total) by mouth 2 (two) times daily.  60 capsule  6  . gabapentin (NEURONTIN) 600 MG tablet Take 1 tablet (600 mg total) by mouth at bedtime.  30 tablet  3  . levonorgestrel-ethinyl estradiol (SEASONALE) 0.15-0.03 MG tablet Take 1 tablet by mouth daily.  1 Package  11  . naproxen sodium (ANAPROX) 220 MG tablet Take 440 mg by mouth daily as needed (For pain.).       Marland Kitchen. oxyCODONE-acetaminophen (PERCOCET/ROXICET) 5-325 MG per tablet Take 1-2 tablets by mouth every 4 (four) hours as needed for severe pain.  15 tablet  0  . promethazine (PHENERGAN) 25 MG tablet Take 1 tablet (25 mg total) by mouth every 6 (six) hours as needed for nausea or vomiting.  30 tablet  0  . traMADol (ULTRAM) 50 MG tablet Take 50 mg by mouth every 6 (six) hours as needed for moderate pain or severe pain.      . Vortioxetine HBr (BRINTELLIX) 10 MG TABS Take 1 tablet (10 mg total) by mouth daily.  30 tablet  3    ROS Physical Exam   Blood pressure 117/60, pulse 104, temperature 97.6 F (36.4 C), resp. rate 20, height 5\' 5"  (1.651 m), weight 48.081 kg (106 lb), last menstrual period 12/21/2013.  Physical Exam  Nursing note and  vitals reviewed. Constitutional: She is oriented to person, place, and time. She appears well-developed and well-nourished. No distress.  Cardiovascular: Normal rate.   Respiratory: Effort normal.  GI: Soft. She exhibits no distension and no mass. There is tenderness. There is guarding. There is no rebound.  Umbilical incision: clean/dry/intact   Neurological: She is alert and oriented to person, place, and time.  Skin: Skin is warm and dry.  Psychiatric: She has a normal mood and affect.   Will recheck temp at 2300 to see if tylenol from percocet is masking fever. 2300: Temp 98.0  MAU Course  Procedures  Results for orders placed during the hospital encounter of 01/07/14 (from the past 24 hour(s))  URINALYSIS, ROUTINE W REFLEX MICROSCOPIC     Status: Abnormal   Collection Time    01/07/14  10:00 PM      Result Value Ref Range   Color, Urine YELLOW  YELLOW   APPearance CLEAR  CLEAR   Specific Gravity, Urine 1.020  1.005 - 1.030   pH 7.0  5.0 - 8.0   Glucose, UA NEGATIVE  NEGATIVE mg/dL   Hgb urine dipstick TRACE (*) NEGATIVE   Bilirubin Urine NEGATIVE  NEGATIVE   Ketones, ur NEGATIVE  NEGATIVE mg/dL   Protein, ur NEGATIVE  NEGATIVE mg/dL   Urobilinogen, UA 0.2  0.0 - 1.0 mg/dL   Nitrite NEGATIVE  NEGATIVE   Leukocytes, UA NEGATIVE  NEGATIVE  URINE MICROSCOPIC-ADD ON     Status: Abnormal   Collection Time    01/07/14 10:00 PM      Result Value Ref Range   Squamous Epithelial / LPF FEW (*) RARE   WBC, UA 0-2  <3 WBC/hpf   RBC / HPF 3-6  <3 RBC/hpf   Bacteria, UA FEW (*) RARE  CBC     Status: Abnormal   Collection Time    01/07/14 10:20 PM      Result Value Ref Range   WBC 8.2  4.0 - 10.5 K/uL   RBC 3.82 (*) 3.87 - 5.11 MIL/uL   Hemoglobin 12.4  12.0 - 15.0 g/dL   HCT 09.8 (*) 11.9 - 14.7 %   MCV 91.1  78.0 - 100.0 fL   MCH 32.5  26.0 - 34.0 pg   MCHC 35.6  30.0 - 36.0 g/dL   RDW 82.9  56.2 - 13.0 %   Platelets 194  150 - 400 K/uL   2300: C/W Dr. Emelda Fear, he will come to evaluate the patient  2330: Dr. Emelda Fear here to see the patient, will send home with more percocet, and have patient take toradol around the clock for the next 2-3 days.  Assessment and Plan   1. Post-operative pain    Comfort measures reviewed Will take toradol PO around the clock New RX for percocet 5/325 #20 given Return to MAU as needed  Follow-up Information   Follow up with [facility]. (As scheduled)    Specialty:  Obstetrics and Gynecology   Contact information:   [address] [place] [address] [phone]       Tawnya Crook 01/07/2014, 10:16 PM

## 2014-01-07 NOTE — Progress Notes (Signed)
Postop laporoscopy 01/05/14

## 2014-01-07 NOTE — MAU Note (Addendum)
Had laporoscopy on Tues for endometriosis. I've been throwing up some last few days and get really dizzy when i stand. No diarrhea.  Had temp 101 1900. Took Oxycodone at 1700. Think i pulled my incision open some at [employer] and very painful. (Incision edges approximated to look at incision.)

## 2014-01-09 ENCOUNTER — Encounter (HOSPITAL_COMMUNITY): Payer: Self-pay | Admitting: *Deleted

## 2014-01-09 ENCOUNTER — Inpatient Hospital Stay (HOSPITAL_COMMUNITY)
Admission: AD | Admit: 2014-01-09 | Discharge: 2014-01-10 | Disposition: A | Discharge status: Home / Self Care | Payer: 59 | Source: Ambulatory Visit | Attending: Obstetrics and Gynecology | Admitting: Obstetrics and Gynecology

## 2014-01-09 DIAGNOSIS — G8929 Other chronic pain: Secondary | ICD-10-CM

## 2014-01-09 DIAGNOSIS — R109 Unspecified abdominal pain: Secondary | ICD-10-CM | POA: Insufficient documentation

## 2014-01-09 DIAGNOSIS — F411 Generalized anxiety disorder: Secondary | ICD-10-CM | POA: Insufficient documentation

## 2014-01-09 DIAGNOSIS — Z87891 Personal history of nicotine dependence: Secondary | ICD-10-CM | POA: Insufficient documentation

## 2014-01-09 DIAGNOSIS — F418 Other specified anxiety disorders: Secondary | ICD-10-CM

## 2014-01-09 DIAGNOSIS — R110 Nausea: Secondary | ICD-10-CM | POA: Insufficient documentation

## 2014-01-09 LAB — BASIC METABOLIC PANEL
BUN: 11 mg/dL (ref 6–23)
CO2: 26 mEq/L (ref 19–32)
Calcium: 9.2 mg/dL (ref 8.4–10.5)
Chloride: 101 mEq/L (ref 96–112)
Creatinine, Ser: 0.55 mg/dL (ref 0.50–1.10)
GFR calc Af Amer: >90 mL/min (ref >90)
Glucose, Bld: 95 mg/dL (ref 70–99)
Potassium: 4 mEq/L (ref 3.7–5.3)
SODIUM: 139 meq/L (ref 137–147)

## 2014-01-09 LAB — URINALYSIS, ROUTINE W REFLEX MICROSCOPIC
BILIRUBIN URINE: NEGATIVE
GLUCOSE, UA: NEGATIVE mg/dL
HGB URINE DIPSTICK: NEGATIVE
KETONES UR: NEGATIVE mg/dL
Leukocytes, UA: NEGATIVE
Nitrite: NEGATIVE
Protein, ur: NEGATIVE mg/dL
Specific Gravity, Urine: >1.03 — ABNORMAL HIGH (ref 1.005–1.030)
UROBILINOGEN UA: 0.2 mg/dL (ref 0.0–1.0)
pH: 5.5 (ref 5.0–8.0)

## 2014-01-09 LAB — CBC
HEMATOCRIT: 35.3 % — AB (ref 36.0–46.0)
Hemoglobin: 12.8 g/dL (ref 12.0–15.0)
MCH: 33.1 pg (ref 26.0–34.0)
MCHC: 36.3 g/dL — AB (ref 30.0–36.0)
MCV: 91.2 fL (ref 78.0–100.0)
Platelets: 196 10*3/uL (ref 150–400)
RBC: 3.87 MIL/uL (ref 3.87–5.11)
RDW: 12.3 % (ref 11.5–15.5)
WBC: 5.8 10*3/uL (ref 4.0–10.5)

## 2014-01-09 MED ORDER — PROMETHAZINE HCL 25 MG RE SUPP
25.0000 mg | Freq: Once | RECTAL | Status: AC
  Administered 2014-01-09: 25 mg via RECTAL
  Filled 2014-01-09: qty 1

## 2014-01-09 MED ORDER — PB-HYOSCY-ATROPINE-SCOPOLAMINE 16.2 MG/5ML PO ELIX
10.0000 mL | ORAL_SOLUTION | Freq: Once | ORAL | Status: AC
  Administered 2014-01-10: 32.4 mg via ORAL
  Filled 2014-01-09: qty 10

## 2014-01-09 NOTE — MAU Note (Signed)
Pt reports a visit from family last Wednesday, where she" played outside with little bro and sister, picking them up ", feels like that was too much activity and has not felt good as far as the pain goes since then.  Says vomiting is mostly dry heaving.

## 2014-01-09 NOTE — MAU Note (Signed)
laparoscopy for endometriosis last Tuesday. States has had fever at home this weekend & has felt lightheaded whenever she stands. Increased pain at incision; green/yellow drainage from incision. Vomiting yesterday & today.

## 2014-01-09 NOTE — MAU Provider Note (Signed)
History    Exam at 2300.  RETURN VISIT for this diffusely anxious, 23 yr female known to me from visit Friday nite. Pt is now 5 d s/p laparoscopy for minimal endometriosis, now notes that she got dressed for church , developed anxiety and abdominal pain. She has a hx of abominal discomforts associated with anxiety, x at least 8 yr, and she has had cholecystectomy already, as well as laparoscopy.  She denies ever making herself vomit, "not really" though when diffusely anxious she freqently has abd pain and may vomit.  CSN: [id number]  Arrival date and time: 01/09/14 2200   None     Chief Complaint  Patient presents with  . Post-op Problem   HPI  When asked if she has ever had anything 'really bad' happen to cause anxiety, pt denies any past traumas.    Past Medical History  Diagnosis Date  . Endometriosis   . Depression     Diagnosed at age 84    Past Surgical History  Procedure Laterality Date  . Cholecystectomy    . Tonsillectomy    . Laparoscopic endometriosis fulguration    . Laparoscopy N/A 01/04/2014    Procedure: LAPAROSCOPY DIAGNOSTIC;  Surgeon: Adam Phenix, MD;  Location: [facility];  Service: Gynecology;  Laterality: N/A;    Family History  Problem Relation Age of Onset  . Hypertension Father   . Diabetes Maternal Grandmother     History  Substance Use Topics  . Smoking status: Former Smoker -- 0.50 packs/day    Types: Cigarettes  . Smokeless tobacco: Never Used  . Alcohol Use: No     Comment: Former user    Allergies:  Allergies  Allergen Reactions  . Ativan [Lorazepam] Other (See Comments)    Makes her feel crazy  . Betadine [Povidone Iodine] Hives  . Reglan [Metoclopramide] Other (See Comments)    "bad thoughts, makes me feel weird."  . Amoxicillin Rash    Prescriptions prior to admission  Medication Sig Dispense Refill  . celecoxib (CELEBREX) 100 MG capsule Take 1-2 capsules (100-200 mg total) by mouth 2 (two) times daily.  60 capsule  6  .  gabapentin (NEURONTIN) 600 MG tablet Take 1 tablet (600 mg total) by mouth at bedtime.  30 tablet  3  . levonorgestrel-ethinyl estradiol (SEASONALE) 0.15-0.03 MG tablet Take 1 tablet by mouth daily.  1 Package  11  . oxyCODONE-acetaminophen (ROXICET) 5-325 MG per tablet Take 1-2 tablets by mouth every 4 (four) hours as needed for severe pain.  15 tablet  0  . promethazine (PHENERGAN) 25 MG tablet Take 1 tablet (25 mg total) by mouth every 6 (six) hours as needed for nausea or vomiting.  30 tablet  0  . Vortioxetine HBr (BRINTELLIX) 10 MG TABS Take 1 tablet (10 mg total) by mouth daily.  30 tablet  3  . naproxen sodium (ANAPROX) 220 MG tablet Take 440 mg by mouth daily as needed (For pain.).       Marland Kitchen traMADol (ULTRAM) 50 MG tablet Take 50 mg by mouth every 6 (six) hours as needed for moderate pain or severe pain.        ROS Physical Exam   Blood pressure 125/65, pulse 94, temperature 98.4 F (36.9 C), resp. rate 16, height 5\' 5"  (1.651 m), weight 47.174 kg (104 lb), last menstrual period 12/21/2013, SpO2 100.00%.  Physical Exam  Constitutional: She is oriented to person, place, and time. She appears well-developed and well-nourished.  Thin hyper anxious  caucasian female with constant rapid. rocking up and down of knees . Pt has a dramatic overdrawn makeup,   HENT:  Head: Normocephalic and atraumatic.  Eyes: Pupils are equal, round, and reactive to light.  Cardiovascular: Normal rate and regular rhythm.   Respiratory: Effort normal.  GI: Soft. Bowel sounds are normal. She exhibits no distension and no mass. There is no tenderness. There is no rebound and no guarding.  Slight hyperactivity to BS, normal pitch, no distention.  Incision appears intact, and absolutely NO swelling around incision, no separation. Pt did not replace bandaid after showering this afternoon before visit here.  Musculoskeletal: Normal range of motion.  Neurological: She is alert and oriented to person, place, and time.   Skin: Skin is warm.  Psychiatric:  Appropriate conversation, acknowleges that she gets diffusely anxious. And has done so for years.    MAU Course  Procedures  MDM Exam, review of records, and labs ordered to confirm normalcy Trial donnatal,  Phenergan supporitory  Assessment and Plan  Diffuse anxiety NOrmal postop state  P Donnatal po    Phenergan suppos  Hagen Bohorquez V 01/09/2014, 11:06 PM

## 2014-01-10 ENCOUNTER — Telehealth: Payer: Self-pay

## 2014-01-10 MED ORDER — PB-HYOSCY-ATROPINE-SCOPOLAMINE 16.2 MG PO TABS: 1.0000 | ORAL_TABLET | Freq: Three times a day (TID) | ORAL | Status: DC | PRN

## 2014-01-10 MED ORDER — PROMETHAZINE HCL 25 MG RE SUPP: 25.0000 mg | Freq: Four times a day (QID) | RECTAL | Status: DC | PRN

## 2014-01-10 NOTE — Telephone Encounter (Signed)
Called [facility] @ [phone] and left message to return call to G[place]etta its concerning a referral to your pain clinic.

## 2014-01-10 NOTE — Telephone Encounter (Signed)
Pt called and stated that she had surgery on Tuesday she vomited and thinks that her incision opened up and that she is has severe constipation and is using Miralax do we have any other suggestions.  Re: Pt went to MAU at 1253 am today.  Has a follow up appt on 01/20/14 Called pt and made sure that patient is ok and pt stated that she was told that she would a referral for the pain clinic for chronic abd pain.  Spoke with Dr. Debroah LoopArnold and he states to go ahead and do the referral to pain clinic and that we will follow up with her on her scheduled appt.  Pt stated that it was fine that I leave a message on her voicemail.   Pt agreed.  [facility] @ [phone] and was informed that the [place] moved [place] and was give [phone] and fax# [phone].

## 2014-01-10 NOTE — MAU Provider Note (Signed)
Original note by Jenna BachJohn Ferguson, MD  History    Exam at 2300.  RETURN VISIT for this diffusely anxious, [age] yr female known to me from visit Friday nite. Pt is now 5 d s/p laparoscopy for minimal endometriosis, now notes that she got dressed for church , developed anxiety and abdominal pain. She has a hx of abominal discomforts associated with anxiety, x at least 8 yr, and she has had cholecystectomy already, as well as laparoscopy.  She denies ever making herself vomit, "not really" though when diffusely anxious she freqently has abd pain and may vomit.  CSN: [id number]  Arrival date and time: 01/09/14 2200   None     Chief Complaint  Patient presents with  . Post-op Problem   HPI  When asked if she has ever had anything 'really bad' happen to cause anxiety, pt denies any past traumas.    Past Medical History  Diagnosis Date  . Endometriosis   . Depression     Diagnosed at age 89    Past Surgical History  Procedure Laterality Date  . Cholecystectomy    . Tonsillectomy    . Laparoscopic endometriosis fulguration    . Laparoscopy N/A 01/04/2014    Procedure: LAPAROSCOPY DIAGNOSTIC;  Surgeon: Jenna PhenixJames G Arnold, MD;  Location: [facility];  Service: Gynecology;  Laterality: N/A;    Family History  Problem Relation Age of Onset  . Hypertension Father   . Diabetes Maternal Grandmother     History  Substance Use Topics  . Smoking status: Former Smoker -- 0.50 packs/day    Types: Cigarettes  . Smokeless tobacco: Never Used  . Alcohol Use: No     Comment: Former user    Allergies:  Allergies  Allergen Reactions  . Ativan [Lorazepam] Other (See Comments)    Makes her feel crazy  . Betadine [Povidone Iodine] Hives  . Reglan [Metoclopramide] Other (See Comments)    "bad thoughts, makes me feel weird."  . Amoxicillin Rash    Prescriptions prior to admission  Medication Sig Dispense Refill  . celecoxib (CELEBREX) 100 MG capsule Take 1-2 capsules (100-200 mg total) by mouth 2  (two) times daily.  60 capsule  6  . gabapentin (NEURONTIN) 600 MG tablet Take 1 tablet (600 mg total) by mouth at bedtime.  30 tablet  3  . levonorgestrel-ethinyl estradiol (SEASONALE) 0.15-0.03 MG tablet Take 1 tablet by mouth daily.  1 Package  11  . oxyCODONE-acetaminophen (ROXICET) 5-325 MG per tablet Take 1-2 tablets by mouth every 4 (four) hours as needed for severe pain.  15 tablet  0  . promethazine (PHENERGAN) 25 MG tablet Take 1 tablet (25 mg total) by mouth every 6 (six) hours as needed for nausea or vomiting.  30 tablet  0  . Vortioxetine HBr (BRINTELLIX) 10 MG TABS Take 1 tablet (10 mg total) by mouth daily.  30 tablet  3  . naproxen sodium (ANAPROX) 220 MG tablet Take 440 mg by mouth daily as needed (For pain.).       Marland Kitchen. traMADol (ULTRAM) 50 MG tablet Take 50 mg by mouth every 6 (six) hours as needed for moderate pain or severe pain.        ROS Physical Exam   Blood pressure 125/65, pulse 94, temperature 98.4 F (36.9 C), resp. rate 16, height 5\' 5"  (1.651 m), weight 47.174 kg (104 lb), last menstrual period 12/21/2013, SpO2 100.00%.  Physical Exam  Constitutional: She is oriented to person, place, and time. She appears  well-developed and well-nourished.  Thin hyper anxious caucasian female with constant rapid. rocking up and down of knees . Pt has a dramatic overdrawn makeup,   HENT:  Head: Normocephalic and atraumatic.  Eyes: Pupils are equal, round, and reactive to light.  Cardiovascular: Normal rate and regular rhythm.   Respiratory: Effort normal.  GI: Soft. Bowel sounds are normal. She exhibits no distension and no mass. There is no tenderness. There is no rebound and no guarding.  Slight hyperactivity to BS, normal pitch, no distention.  Incision appears intact, and absolutely NO swelling around incision, no separation. Pt did not replace bandaid after showering this afternoon before visit here.  Musculoskeletal: Normal range of motion.  Neurological: She is alert and  oriented to person, place, and time.  Skin: Skin is warm.  Psychiatric:  Appropriate conversation, acknowleges that she gets diffusely anxious. And has done so for years.    MAU Course  Procedures  MDM Exam, review of records, and labs ordered to confirm normalcy Trial donnatal,  Phenergan supporitory  Assessment and Plan  Diffuse anxiety NOrmal postop state  P Donnatal po    Phenergan suppos   I have seen this pt also and agree with Dr Jenna Blake assessment.  She reports improved nausea following medications but continues to exhibit symptoms of diffuse anxiety.  She denies suicidal or homicidal ideations and report she feels safe in her home and her life situation.  She is anxious about her pain, and indications she has had abdominal pain for years, worsening with anxiety.  D/C home Rx for phenergan and donnatal Pt has seen primary care at [facility] with Rx for antidepressants.  Recommend follow up since she reports no improvement in 3 months of medication. List of psychological services providers given to pt.  Recommend regular counseling. Recommend exercise, relaxation techniques, mediation, yoga to manage anxiety. Referal to pain clinic for chronic abdominal pain Keep scheduled f/u appointment with Dr Jenna Blake on 4/16   Jenna Blake, Jenna Blake 01/10/2014, 12:53 AM

## 2014-01-10 NOTE — Discharge Instructions (Signed)
Chronic Pain Chronic pain can be defined as pain that is off and on and lasts for 3 6 months or longer. Many things cause chronic pain, which can make it difficult to make a diagnosis. There are many treatment options available for chronic pain. However, finding a treatment that works well for you may require trying various approaches until the right one is found. Many people benefit from a combination of two or more types of treatment to control their pain. SYMPTOMS  Chronic pain can occur anywhere in the body and can range from mild to very severe. Some types of chronic pain include:  Headache.  Low back pain.  Cancer pain.  Arthritis pain.  Neurogenic pain. This is pain resulting from damage to nerves. People with chronic pain may also have other symptoms such as:  Depression.  Anger.  Insomnia.  Anxiety. DIAGNOSIS  Your health care provider will help diagnose your condition over time. In many cases, the initial focus will be on excluding possible conditions that could be causing the pain. Depending on your symptoms, your health care provider may order tests to diagnose your condition. Some of these tests may include:   Blood tests.   CT scan.   MRI.   X-rays.   Ultrasounds.   Nerve conduction studies.  You may need to see a specialist.  TREATMENT  Finding treatment that works well may take time. You may be referred to a pain specialist. He or she may prescribe medicine or therapies, such as:   Mindful meditation or yoga.  Shots (injections) of numbing or pain-relieving medicines into the spine or area of pain.  Local electrical stimulation.  Acupuncture.   Massage therapy.   Aroma, color, light, or sound therapy.   Biofeedback.   Working with a physical therapist to keep from getting stiff.   Regular, gentle exercise.   Cognitive or behavioral therapy.   Group support.  Sometimes, surgery may be recommended.  HOME CARE INSTRUCTIONS    Take all medicines as directed by your health care provider.   Lessen stress in your life by relaxing and doing things such as listening to calming music.   Exercise or be active as directed by your health care provider.   Eat a healthy diet and include things such as vegetables, fruits, fish, and lean meats in your diet.   Keep all follow-up appointments with your health care provider.   Attend a support group with others suffering from chronic pain. SEEK MEDICAL CARE IF:   Your pain gets worse.   You develop a new pain that was not there before.   You cannot tolerate medicines given to you by your health care provider.   You have new symptoms since your last visit with your health care provider.  SEEK IMMEDIATE MEDICAL CARE IF:   You feel weak.   You have decreased sensation or numbness.   You lose control of bowel or bladder function.   Your pain suddenly gets much worse.   You develop shaking.  You develop chills.  You develop confusion.  You develop chest pain.  You develop shortness of breath.  MAKE SURE YOU:  Understand these instructions.  Will watch your condition.  Will get help right away if you are not doing well or get worse. Document Released: 06/15/2002 Document Revised: 05/26/2013 Document Reviewed: 03/19/2013 [facility] Patient Information 2014 [place], [place].  Generalized Anxiety Disorder Generalized anxiety disorder (GAD) is a mental disorder. It interferes with life functions, including relationships, work,  and school. GAD is different from normal anxiety, which everyone experiences at some point in their lives in response to specific life events and activities. Normal anxiety actually helps us prepare for and get through these life events and activities. Normal anxiety goes away after the event or activity is over.  GAD causes anxiety that is not necessarily related to specific events or activities. It also causes excess  anxiety in proportion to specific events or activities. The anxiety associated with GAD is also difficult to control. GAD can vary from mild to severe. People with severe GAD can have intense waves of anxiety with physical symptoms (panic attacks).  SYMPTOMS The anxiety and worry associated with GAD are difficult to control. This anxiety and worry are related to many life events and activities and also occur more days than not for 6 months or longer. People with GAD also have three or more of the following symptoms (one or more in children):  Restlessness.   Fatigue.  Difficulty concentrating.   Irritability.  Muscle tension.  Difficulty sleeping or unsatisfying sleep. DIAGNOSIS GAD is diagnosed through an assessment by your caregiver. Your caregiver will ask you questions aboutyour mood,physical symptoms, and events in your life. Your caregiver may ask you about your medical history and use of alcohol or drugs, including prescription medications. Your caregiver may also do a physical exam and blood tests. Certain medical conditions and the use of certain substances can cause symptoms similar to those associated with GAD. Your caregiver may refer you to a mental health specialist for further evaluation. TREATMENT The following therapies are usually used to treat GAD:   Medication Antidepressant medication usually is prescribed for long-term daily control. Antianxiety medications may be added in severe cases, especially when panic attacks occur.   Talk therapy (psychotherapy) Certain types of talk therapy can be helpful in treating GAD by providing support, education, and guidance. A form of talk therapy called cognitive behavioral therapy can teach you healthy ways to think about and react to daily life events and activities.  Stress managementtechniques These include yoga, meditation, and exercise and can be very helpful when they are practiced regularly. A mental health specialist  can help determine which treatment is best for you. Some people see improvement with one therapy. However, other people require a combination of therapies. Document Released: 01/18/2013 Document Reviewed: 01/18/2013 [facility] Patient Information 2014 [place].  [facility] Resource Guide (Revised August 2014)   Chronic Pain Problems:    [facility]:  [phone]           Patients need to be referred by their primary care doctor/specialist            No Primary Care Doctor:  To locate a primary care doctor that accepts your insurance or provides certain services:           [place] [facility]: [phone]           Physician Referral Service: [phone] ask for My [facility]   If no insurance, you need to see if you qualify for [facility] orange card, call to set      up appointment for eligibility/enrollment at [phone] or [phone] or visit [facility] and [employer] ([address], [place] and [address] -[place]) to meet with a [facility] enrollment specialist.   Behavioral Health Services /Substance Abuse Resources:           Alcohol and Drug Services: [phone]  [facility]: [phone]          [facility]: [phone]    Narcotics Helpline - [phone]          [facility]: [phone]           Residential & Outpatient Substance Abuse Program - [facility] [place]: [phone]   [facility] - [phone] Psychological Services:          [facility]: [phone]    [facility]: [phone]          [facility]           [address], [place]           ACCESS LINE: [phone]     (24 Hour)   Mobile Crisis:    [place]:  Financial risk analyst on Mental Illness - [place] [phone] [facility] on Mental Illness - [place] [phone]   [facility] - [address] - [place]  [phone]       [facility] - [phone] or [phone]  [facility] - [phone] [address] - [employer] [phone]  [facility] [phone]. [address] [place] [phone]

## 2014-01-11 ENCOUNTER — Telehealth: Payer: Self-pay | Admitting: *Deleted

## 2014-01-11 MED ORDER — TRAMADOL HCL 50 MG PO TABS: 50.0000 mg | ORAL_TABLET | Freq: Four times a day (QID) | ORAL | Status: DC | PRN

## 2014-01-11 NOTE — Telephone Encounter (Signed)
Spoke with Dr. Debroah LoopArnold and he stated that patient can have a refill on tramadol.  Will call pt concerning pain clinic referral.

## 2014-01-11 NOTE — MAU Provider Note (Signed)
Attestation of Attending Supervision of Advanced Practitioner: Evaluation and management procedures were performed by the PA/NP/CNM/OB Fellow under my supervision/collaboration. Chart reviewed and agree with management and plan.  Avanelle Pixley V 01/11/2014 10:47 PM

## 2014-01-11 NOTE — MAU Provider Note (Signed)
Attestation of Attending Supervision of Advanced Practitioner: Evaluation and management procedures were performed by the PA/NP/CNM/OB Fellow under my supervision/collaboration. Chart reviewed and agree with management and plan.  Ilan Kahrs V 01/11/2014 10:48 PM

## 2014-01-11 NOTE — Telephone Encounter (Signed)
[facility] 50 mg per Dr. Debroah LoopArnold recommendations.  Called pt and informed pt that [facility] her with an appointment and I gave her the # [phone]  to contact [facility] just in case she does not here back from them.  Pt stated that she wrote it down.  I also informed pt that I have faxed over office notes/demographics to them so that pt knows it has been done.  I then informed pt that the tramadol she requested for pain has [facility] and that it should last until her appt on 01/20/14 with Dr. Debroah LoopArnold.  Pt stated thank you with no further questions.

## 2014-01-11 NOTE — Telephone Encounter (Signed)
Jenna Blake called and left a message this am that she is still having a lot of problems/ pain and wanted to see if she could get some tramadol. States she doesn't want percocet because it makes her drowsy and has trouble staying awake. Also  Wants to see when the pain clinic will call her with her referral.

## 2014-01-11 NOTE — Telephone Encounter (Addendum)
Called [facility] and was advised by Morrie SheldonAshley to send office notes and demographics to fax # [phone].  And [facility] will call with an appt.

## 2014-01-12 ENCOUNTER — Telehealth: Payer: Self-pay | Admitting: *Deleted

## 2014-01-12 NOTE — Telephone Encounter (Addendum)
Pt called nurse line request refill on medication, no other information given.  Called patient, unable to leave message due to her mailbox being full. 4/10  1028  Called pt and could not leave message due to the mailbox still being full.  Diane Day RNC

## 2014-01-17 NOTE — Telephone Encounter (Addendum)
Patient called and left message stating she is returning our phone call. Called patient, no answer- unable to leave message due to mailbox full. 4/14  1104  Pt returned our call and left message that she will have her phone on all day today so we may call back. I called and was able to speak with pt. She stated that she had called last week for a refill of Tramadol. She has already received the refill but is running out again. She has clinic appt on 4/16. I advised pt to try to make the medication last because it is unlikely that she can have additional refills until she is evaluated by the doctor. She should take ibuprofen for milder pain and save the Tramadol for more severe pain. Pt also stated that she has not heard anything from the pain clinic. She asked if she should call them for an update. I agreed that she should call them and provide Dr. Debroah LoopArnold with the information @ the time of her appt on 4/16.  Pt voiced understanding of all information and instructions given.

## 2014-01-20 ENCOUNTER — Ambulatory Visit (INDEPENDENT_AMBULATORY_CARE_PROVIDER_SITE_OTHER): Payer: 59 | Admitting: Obstetrics & Gynecology

## 2014-01-20 ENCOUNTER — Encounter: Payer: Self-pay | Admitting: Obstetrics & Gynecology

## 2014-01-20 VITALS — BP 126/71 | HR 80 | Temp 97.0°F | Ht 65.0 in | Wt 106.9 lb

## 2014-01-20 DIAGNOSIS — Z09 Encounter for follow-up examination after completed treatment for conditions other than malignant neoplasm: Secondary | ICD-10-CM

## 2014-01-20 DIAGNOSIS — N809 Endometriosis, unspecified: Secondary | ICD-10-CM

## 2014-01-20 MED ORDER — TRAMADOL HCL 50 MG PO TABS: 50.0000 mg | ORAL_TABLET | Freq: Four times a day (QID) | ORAL | Status: DC | PRN

## 2014-01-20 MED ORDER — LEUPROLIDE ACETATE (3 MONTH) 11.25 MG IM KIT
11.2500 mg | PACK | Freq: Once | INTRAMUSCULAR | Status: AC
  Administered 2014-02-03: 11.25 mg via INTRAMUSCULAR

## 2014-01-20 MED ORDER — MEDROXYPROGESTERONE ACETATE 5 MG PO TABS: 5.0000 mg | ORAL_TABLET | Freq: Every day | ORAL | Status: DC

## 2014-01-20 NOTE — Progress Notes (Signed)
Patient ID: Jenna Blake, female   DOB: Feb 08, 1991, 23 y.o.   MRN: [id number] Subjective:     Jenna Blake is a 23 y.o. female who presents to the clinic 4 weeks status post laparoscopy for endometriosis. Eating a regular diet without difficulty. Bowel movements are painful but improving. Pain is controlled with current analgesics. Medications being used: ultram.  The following portions of the patient's history were reviewed and updated as appropriate: allergies, current medications, past family history, past medical history, past social history, past surgical history and problem list.  Review of Systems Pertinent items are noted in HPI.    Objective:    BP 126/71  Pulse 80  Temp(Src) 97 F (36.1 C) (Oral)  Ht 5\' 5"  (1.651 m)  Wt 106 lb 14.4 oz (48.49 kg)  BMI 17.79 kg/m2  LMP 01/17/2014 General:  alert, cooperative and no distress  Abdomen: soft, bowel sounds active, non-tender  Incision:   healing well, no drainage, no erythema, no hernia, no seroma, no swelling, no dehiscence, incision well approximated     Assessment:    Doing well postoperatively. Operative findings again reviewed. Pathology report discussed.    Plan:    1. Continue any current medications. 2. Wound care discussed. 3. Activity restrictions: none 4. Anticipated return to work: now. 5. Follow up: 3 mo. Recommend Lupron 11.25 mg IM 6 mo, provera 5 mg daily, Ca supplement  Adam PhenixJames G [name], MD 01/20/2014

## 2014-01-20 NOTE — Progress Notes (Signed)
Jenna Blake unsure if her policy covers Lupron- desires to check first with her insurance , then come back and get shot. Will make appt for shot only for next week.

## 2014-01-24 ENCOUNTER — Ambulatory Visit: Payer: 59

## 2014-01-26 ENCOUNTER — Telehealth: Payer: Self-pay

## 2014-01-26 NOTE — Telephone Encounter (Signed)
Pt. Called stating she has her Lupron shot on Monday but is still waiting to hear from the pain clinic; she is still having bad stomach pains. States she is also suffering from anxiety and has a tick where she pulls out her hair and is now starting to lose hair; would like to know if we can refer her to a counselor or someone who will be able to prescribe medication for anxiety to help her manage as it has become "difficult and debilitating."

## 2014-01-26 NOTE — Telephone Encounter (Signed)
Called pt. Pt. States [facility]. Informed pt. That she should schedule an appointment to see PCP, they may be able to get her on anxiety medication and then refer her to a counselor or psychiatrist to help pt. Manage her symptoms. Informed pt. That most insurances require a referral from PCP. Pt. Verbalized understanding and gratitude and stated she would make an appointment. Pt. Asked if I had any recommendations for stress/anxiety relief. Informed pt. Sometimes yoga, walking, music or light exercise can help decrease stress levels and may help her with her anxiety. Pt. Stated she may look into yoga classes, voiced gratitude, and stated she will be here on Monday for Depo-Lupron. No further questions or concerns.

## 2014-01-27 ENCOUNTER — Encounter: Payer: Self-pay | Admitting: Family Medicine

## 2014-01-27 ENCOUNTER — Ambulatory Visit (INDEPENDENT_AMBULATORY_CARE_PROVIDER_SITE_OTHER): Payer: 59 | Admitting: Family Medicine

## 2014-01-27 VITALS — BP 118/78 | HR 115 | Temp 98.2°F | Wt 103.6 lb

## 2014-01-27 DIAGNOSIS — F418 Other specified anxiety disorders: Secondary | ICD-10-CM

## 2014-01-27 DIAGNOSIS — F341 Dysthymic disorder: Secondary | ICD-10-CM

## 2014-01-27 DIAGNOSIS — F411 Generalized anxiety disorder: Secondary | ICD-10-CM

## 2014-01-27 MED ORDER — VORTIOXETINE HBR 20 MG PO TABS: ORAL_TABLET | ORAL | Status: DC

## 2014-01-27 MED ORDER — TRAMADOL HCL 50 MG PO TABS: 50.0000 mg | ORAL_TABLET | Freq: Four times a day (QID) | ORAL | Status: DC | PRN

## 2014-01-27 MED ORDER — ALPRAZOLAM 0.25 MG PO TABS: 0.2500 mg | ORAL_TABLET | Freq: Three times a day (TID) | ORAL | Status: DC | PRN

## 2014-01-27 NOTE — Patient Instructions (Signed)

## 2014-01-27 NOTE — Progress Notes (Signed)
Pre visit review using our clinic review tool, if applicable. No additional management support is needed unless otherwise documented below in the visit note. 

## 2014-01-27 NOTE — Progress Notes (Signed)
   Subjective:    Patient ID: Jenna Blake, female    DOB: 10-Aug-1991, 23 y.o.   MRN: [id number]  HPI    Review of Systems     Objective:   Physical Exam BP 118/78  Pulse 115  Temp(Src) 98.2 F (36.8 C) (Oral)  Wt 103 lb 9.6 oz (46.993 kg)  SpO2 94%  LMP 01/17/2014 General appearance: alert, cooperative, appears stated age and no distress Lungs: clear to auscultation bilaterally Heart: regular rate and rhythm, S1, S2 normal, no murmur, click, rub or gallop Neurologic: Alert and oriented X 3, normal strength and tone. Normal symmetric reflexes. Normal coordination and gait       Assessment & Plan:  1. Generalized anxiety disorder   - ALPRAZolam (XANAX) 0.25 MG tablet; Take 1 tablet (0.25 mg total) by mouth 3 (three) times daily as needed for anxiety.  Dispense: 30 tablet; Refill: 0  2. Depression with anxiety Names and numbers of psych given to patient - Vortioxetine HBr (BRINTELLIX) 20 MG TABS; 1 po qd  Dispense: 30 tablet; Refill: 5

## 2014-01-31 ENCOUNTER — Telehealth: Payer: Self-pay

## 2014-01-31 ENCOUNTER — Telehealth: Payer: Self-pay | Admitting: Family Medicine

## 2014-01-31 ENCOUNTER — Ambulatory Visit: Payer: 59

## 2014-01-31 ENCOUNTER — Other Ambulatory Visit: Payer: Self-pay | Admitting: Obstetrics & Gynecology

## 2014-01-31 NOTE — Telephone Encounter (Signed)
All future Rx requests for chronic pelvic pain/endometriosis should go to Scheryl DarterJames Arnold MD. I only saw pt once in ED.

## 2014-01-31 NOTE — Telephone Encounter (Signed)
Patient Information:  Caller Name: Tamera PuntMiranda  Phone: [phone]  Patient: Jenna Blake, Jenna Blake  Gender: Female  DOB: [birth year]  Age: [age] Years  PCP: Lelon PerlaLowne, Yvonne R.  Pregnant: No  Office Follow Up:  Does the office need to follow up with this patient?: Yes  Instructions For The Office: Please call pt if MD will see in office. Pt states she has no ride.  RN Note:  Pt refused ED or 911; no ride. Prefers to be seen in office.  Symptoms  Reason For Call & Symptoms: Pt calling regarding abdominal pain since last night, 01/30/14. Pt states she has endometriosis and chronic abdominal pain. Waiting to hear back from pain clinic. Pt very tearful. Had endometriosis surgery 3 weeks ago. Had f/u with OB/GYN and they state everything is fine and she needs to f/u with PCP. No vag bleeding. Making good urine. Denies N/V/D. Took Tramadol but does not help.   Reviewed Health History In EMR: Yes  Reviewed Medications In EMR: Yes  Reviewed Allergies In EMR: Yes  Reviewed Surgeries / Procedures: Yes  Date of Onset of Symptoms: 01/30/2014 OB / GYN:  LMP: 01/17/2014  Guideline(s) Used:  Abdominal Pain - Female  Disposition Per Guideline:   Go to ED Now  Reason For Disposition Reached:   Severe abdominal pain (e.g., excruciating)  Advice Given:  N/A  Patient Refused Recommendation:  Patient Refused Care Advice  Pt prefers to be seen in office.

## 2014-01-31 NOTE — Addendum Note (Signed)
Addended by: Jill SideAY, Prabhjot Maddux L on: 01/31/2014 05:19 PM   Modules accepted: Orders, Medications

## 2014-01-31 NOTE — Telephone Encounter (Addendum)
Pt left message stating that she could not keep her clinic appt today for injection because she had too much pain. She again stated in her message that she is on the waiting list at the pain clinic and does not have an appt. She states that she has only a couple tablets of Tramadol left and does not want to run out- requests refill.  Per chart review, refill request has already been sent to Dr. Debroah LoopArnold by [facility]. Pt received #30 tabs on 4/16 by Dr. Debroah LoopArnold and #30 tabs on 4/23 by Dr. Laury AxonLowne (PCP w/[facility])  I spoke to Dr. Debroah LoopArnold regarding pt's request and he had just finished approving the refill. When I informed him that pt received #30 tabs 4 days ago, he requested me to deny the refill. [facility] is denied at this time and will call pt tomorrow morning.  4/28  0950  Called pt and informed her that Dr. Debroah LoopArnold has denied refill of Tramadol at this time because she had just received #30 tabs on 4/23 from Dr. Laury AxonLowne. Pt advised that she cannot be receiving refills from both offices and should request from only one provider. Pt also has requested refill from Dr. Ernst SpellLowne's office today (per chart review).  Pt voiced understanding. Pt again states that she is not able to get appt @ the pain clinic and is on a waiting list. I obtained the tel # from her [phone]) so that I may be able to facilitate the appt and will notify her of information gained. I [facility] and spoke with Helmut MusterAlicia. She confirmed that they give appts for referrals as they are received and are now finishing up with March referrals.  Suhaila's referral was placed on 4/6. She will most likely get an appt in late May or early June. I called Preslyn back and gave her this information. I also advised her that she really needs to come in for her Lupron injection as Dr. Debroah LoopArnold expects it to help with her pain. This medication works over time and the sooner she can get it, the sooner she may  begin to feel better.  Pt voice understanding and agreed to come in today @ 3pm for injection.

## 2014-01-31 NOTE — Telephone Encounter (Signed)
Pt. Called stating she is supposed to come in for Lupron injection today but cant make it because she is in so much pain. States she is taking tramadol that doesn't seem to be helping, wants to know if she can be prescribed something stronger or if she needs to continue taking tramadol or if she should go to [facility] but the last time she was there they did not do anything. Pt. States she has called the pain clinic and she is still on the waiting list. Called pt. And informed her that at this time she should continue to take tramadol and should come in for Lupron injection as this will ease some of her pain. Pt. Verbalized understanding and stated she would try to make it today. No other questions or concerns.

## 2014-02-01 ENCOUNTER — Ambulatory Visit: Payer: 59

## 2014-02-01 ENCOUNTER — Telehealth: Payer: Self-pay | Admitting: Family Medicine

## 2014-02-01 ENCOUNTER — Other Ambulatory Visit: Payer: Self-pay | Admitting: Family Medicine

## 2014-02-01 NOTE — Telephone Encounter (Signed)
A user error has taken place.

## 2014-02-01 NOTE — Telephone Encounter (Signed)
Spoke with the pt and informed her that the rx was filled on (01-31-14), [facility]  Pt understood and agreed,and stated [facility].//AB/CMA

## 2014-02-01 NOTE — Telephone Encounter (Signed)
Caller name:Aryaa B Mesa Relation to VH:QIONGEXpt:patient Call back number: [phone] Pharmacy: [facility] [address]  Reason for call: Patient called to request a refill for Tramadol

## 2014-02-01 NOTE — Telephone Encounter (Addendum)
Patient called and asked for a refill on the Tramadol. I reviewed the patient's chart and she is receiving the Tramadol from multiple providers, she had the intention of getting another [facility]ill [facility] but I advised Dr.Lowne is not her PCP and I would need to relay this information to Dr.Tabori prior to a decision being made. Patient has received a refill from her GYN on 01/11/14 #30, 01/20/14 #30 and on 01/27/14 #30 from Dr.Lowne. I asked the patient and how many pills is she taking and she stated she is taking the medication daily, I asked how many and she said one tablet  three times a day. I asked was she needing to take this much due to increase pain and she said she was taking them as directed. I reviewed her chart and GYN is in the process of sending this patient to pain management. I advised at this time we could not refill and she is requesting this medication too soon, she said she still had pills left but thought it was okay to request the medication early. I advised that was unacceptable and her receiving medication from multiple providers is a breach of contract if one was signed. I made her aware I would send this information to her PCP and someone will call her with a response in reference to her refill request. She voiced understanding.     KP

## 2014-02-02 ENCOUNTER — Encounter (HOSPITAL_COMMUNITY): Payer: Self-pay | Admitting: Emergency Medicine

## 2014-02-02 ENCOUNTER — Other Ambulatory Visit: Payer: 59

## 2014-02-02 ENCOUNTER — Emergency Department (HOSPITAL_COMMUNITY)
Admission: EM | Admit: 2014-02-02 | Discharge: 2014-02-02 | Disposition: A | Discharge status: Home / Self Care | Payer: 59 | Attending: Emergency Medicine | Admitting: Emergency Medicine

## 2014-02-02 ENCOUNTER — Telehealth: Payer: Self-pay | Admitting: *Deleted

## 2014-02-02 ENCOUNTER — Encounter: Payer: Self-pay | Admitting: General Practice

## 2014-02-02 DIAGNOSIS — N949 Unspecified condition associated with female genital organs and menstrual cycle: Secondary | ICD-10-CM | POA: Insufficient documentation

## 2014-02-02 DIAGNOSIS — Z880 Allergy status to penicillin: Secondary | ICD-10-CM | POA: Insufficient documentation

## 2014-02-02 DIAGNOSIS — G8929 Other chronic pain: Secondary | ICD-10-CM | POA: Insufficient documentation

## 2014-02-02 DIAGNOSIS — Z9089 Acquired absence of other organs: Secondary | ICD-10-CM | POA: Insufficient documentation

## 2014-02-02 DIAGNOSIS — F329 Major depressive disorder, single episode, unspecified: Secondary | ICD-10-CM | POA: Insufficient documentation

## 2014-02-02 DIAGNOSIS — Z791 Long term (current) use of non-steroidal anti-inflammatories (NSAID): Secondary | ICD-10-CM | POA: Insufficient documentation

## 2014-02-02 DIAGNOSIS — Z79899 Other long term (current) drug therapy: Secondary | ICD-10-CM | POA: Insufficient documentation

## 2014-02-02 DIAGNOSIS — R102 Pelvic and perineal pain: Secondary | ICD-10-CM

## 2014-02-02 DIAGNOSIS — Z87891 Personal history of nicotine dependence: Secondary | ICD-10-CM | POA: Insufficient documentation

## 2014-02-02 DIAGNOSIS — F3289 Other specified depressive episodes: Secondary | ICD-10-CM | POA: Insufficient documentation

## 2014-02-02 DIAGNOSIS — Z9889 Other specified postprocedural states: Secondary | ICD-10-CM | POA: Insufficient documentation

## 2014-02-02 MED ORDER — TRAMADOL HCL 50 MG PO TABS: 50.0000 mg | ORAL_TABLET | Freq: Four times a day (QID) | ORAL | Status: DC | PRN

## 2014-02-02 MED ORDER — OXYCODONE-ACETAMINOPHEN 5-325 MG PO TABS
2.0000 | ORAL_TABLET | Freq: Once | ORAL | Status: AC
  Administered 2014-02-02: 2 via ORAL
  Filled 2014-02-02: qty 2

## 2014-02-02 NOTE — ED Provider Notes (Signed)
CSN: [id number]     Arrival date & time 02/02/14  1125 History   First MD Initiated Contact with Patient 02/02/14 1154     Chief Complaint  Patient presents with  . Abdominal Pain     (Consider location/radiation/quality/duration/timing/severity/associated sxs/prior Treatment) Patient is a 23 y.o. female presenting with abdominal pain.  Abdominal Pain  Pt with long history of chronic pelvic pain due to endometriosis recently had a laparoscopic evaluation by Gyn. She takes Tramadol frequently, has been getting Rx from Gyn but also calling PCP for refills. She is scheduled to be referred to Pain Management. She reports 2-3 days of sharp, severe pain in diffuse pelvis, worse on the R, some vomiting, no fever. No dysuria, hematuria or vaginal discharge. Does not think she's pregnant.   Past Medical History  Diagnosis Date  . Endometriosis   . Depression     Diagnosed at age 64   Past Surgical History  Procedure Laterality Date  . Cholecystectomy    . Tonsillectomy    . Laparoscopic endometriosis fulguration    . Laparoscopy N/A 01/04/2014    Procedure: LAPAROSCOPY DIAGNOSTIC;  Surgeon: Woodroe Mode, MD;  Location: [facility];  Service: Gynecology;  Laterality: N/A;   Family History  Problem Relation Age of Onset  . Hypertension Father   . Diabetes Maternal Grandmother    History  Substance Use Topics  . Smoking status: Former Smoker -- 0.50 packs/day    Types: Cigarettes  . Smokeless tobacco: Never Used  . Alcohol Use: No     Comment: Former user   OB History   Grav Para Term Preterm Abortions TAB SAB Ect Mult Living   '0 0 0 0 0 0 0 0 0 0 '     Review of Systems  Gastrointestinal: Positive for abdominal pain.   All other systems reviewed and are negative except as noted in HPI.     Allergies  Ativan; Betadine; Reglan; and Amoxicillin  Home Medications   Prior to Admission medications   Medication Sig Start Date End Date Taking? Authorizing Provider  ALPRAZolam  (XANAX) 0.25 MG tablet Take 1 tablet (0.25 mg total) by mouth 3 (three) times daily as needed for anxiety. 01/27/14  Yes Rosalita Chessman, DO  celecoxib (CELEBREX) 100 MG capsule Take 1-2 capsules (100-200 mg total) by mouth 2 (two) times daily. 12/22/13 12/22/14 Yes Manya Silvas, CNM  gabapentin (NEURONTIN) 600 MG tablet Take 1 tablet (600 mg total) by mouth at bedtime. 11/15/13  Yes Woodroe Mode, MD  levonorgestrel-ethinyl estradiol (SEASONALE) 0.15-0.03 MG tablet Take 1 tablet by mouth daily. 10/14/13 10/14/14 Yes Seabron Spates, CNM  promethazine (PHENERGAN) 25 MG suppository Place 1 suppository (25 mg total) rectally every 6 (six) hours as needed for nausea or vomiting. 01/10/14  Yes Lisa A Leftwich-Kirby, CNM  promethazine (PHENERGAN) 25 MG tablet Take 1 tablet (25 mg total) by mouth every 6 (six) hours as needed for nausea or vomiting. 12/31/13  Yes Olegario Messier, NP  traMADol (ULTRAM) 50 MG tablet Take 50 mg by mouth every 6 (six) hours as needed for moderate pain or severe pain.   Yes Historical Provider, MD  Vortioxetine HBr (BRINTELLIX) 20 MG TABS 1 po qd 01/27/14  Yes Yvonne R Lowne, DO  belladona alk-PHENObarbital (DONNATAL) 16.2 MG tablet Take 1 tablet by mouth every 8 (eight) hours as needed. 01/10/14   Kathie Dike Leftwich-Kirby, CNM  medroxyPROGESTERone (PROVERA) 5 MG tablet Take 1 tablet (5 mg total) by mouth daily. 01/20/14  Woodroe Mode, MD   BP 110/65  Pulse 100  Temp(Src) 98.1 F (36.7 C) (Oral)  Resp 18  SpO2 100%  LMP 01/17/2014 Physical Exam  Nursing note and vitals reviewed. Constitutional: She is oriented to person, place, and time. She appears well-developed and well-nourished.  HENT:  Head: Normocephalic and atraumatic.  Eyes: EOM are normal. Pupils are equal, round, and reactive to light.  Neck: Normal range of motion. Neck supple.  Cardiovascular: Normal rate, normal heart sounds and intact distal pulses.   Pulmonary/Chest: Effort normal and breath sounds normal.   Abdominal: Bowel sounds are normal. She exhibits no distension. There is tenderness (diffuse lower abdomen, worse midline and R side. No peritoneal signs). There is no rebound and no guarding.  Musculoskeletal: Normal range of motion. She exhibits no edema and no tenderness.  Neurological: She is alert and oriented to person, place, and time. She has normal strength. No cranial nerve deficit or sensory deficit.  Skin: Skin is warm and dry. No rash noted.  Psychiatric: She has a normal mood and affect.    ED Course  Procedures (including critical care time) Labs Review Labs Reviewed - No data to display  Imaging Review No results found.   EKG Interpretation None      MDM   Final diagnoses:  Chronic pelvic pain in female    Pt has had extensive prior workup for similar pelvic pain. Doubt this is an acute surgical process. Pt has no concerns for pregnancy. Exam not consistent with appendicitis. Advised close followup in [place] office if pain is persistent.    Jaryn Rosko B. Karle Starch, MD 02/02/14 [phone]

## 2014-02-02 NOTE — Telephone Encounter (Signed)
Letter mailed to pt.  

## 2014-02-02 NOTE — Telephone Encounter (Signed)
No refill- have not seen pt since Jan

## 2014-02-02 NOTE — Discharge Instructions (Signed)
Pelvic Pain, Female °Female pelvic pain can be caused by many different things and start from a variety of places. Pelvic pain refers to pain that is located in the lower half of the abdomen and between your hips. The pain may occur over a short period of time (acute) or may be reoccurring (chronic). The cause of pelvic pain may be related to disorders affecting the female reproductive organs (gynecologic), but it may also be related to the bladder, kidney stones, an intestinal complication, or muscle or skeletal problems. Getting help right away for pelvic pain is important, especially if there has been severe, sharp, or a sudden onset of unusual pain. It is also important to get help right away because some types of pelvic pain can be life threatening.  °CAUSES  °Below are only some of the causes of pelvic pain. The causes of pelvic pain can be in one of several categories.  °· Gynecologic. °· Pelvic inflammatory disease. °· Sexually transmitted infection. °· Ovarian cyst or a twisted ovarian ligament (ovarian torsion). °· Uterine lining that grows outside the uterus (endometriosis). °· Fibroids, cysts, or tumors. °· Ovulation. °· Pregnancy. °· Pregnancy that occurs outside the uterus (ectopic pregnancy). °· Miscarriage. °· Labor. °· Abruption of the placenta or ruptured uterus. °· Infection. °· Uterine infection (endometritis). °· Bladder infection. °· Diverticulitis. °· Miscarriage related to a uterine infection (septic abortion). °· Bladder. °· Inflammation of the bladder (cystitis). °· Kidney stone(s). °· Gastrointenstinal. °· Constipation. °· Diverticulitis. °· Neurologic. °· Trauma. °· Feeling pelvic pain because of mental or emotional causes (psychosomatic). °· Cancers of the bowel or pelvis. °EVALUATION  °Your caregiver will want to take a careful history of your concerns. This includes recent changes in your health, a careful gynecologic history of your periods (menses), and a sexual history. Obtaining  your family history and medical history is also important. Your caregiver may suggest a pelvic exam. A pelvic exam will help identify the location and severity of the pain. It also helps in the evaluation of which organ system may be involved. In order to identify the cause of the pelvic pain and be properly treated, your caregiver may order tests. These tests may include:  °· A pregnancy test. °· Pelvic ultrasonography. °· An X-ray exam of the abdomen. °· A urinalysis or evaluation of vaginal discharge. °· Blood tests. °HOME CARE INSTRUCTIONS  °· Only take over-the-counter or prescription medicines for pain, discomfort, or fever as directed by your caregiver.   °· Rest as directed by your caregiver.   °· Eat a balanced diet.   °· Drink enough fluids to make your urine clear or pale yellow, or as directed.   °· Avoid sexual intercourse if it causes pain.   °· Apply warm or cold compresses to the lower abdomen depending on which one helps the pain.   °· Avoid stressful situations.   °· Keep a journal of your pelvic pain. Write down when it started, where the pain is located, and if there are things that seem to be associated with the pain, such as food or your menstrual cycle. °· Follow up with your caregiver as directed.   °SEEK MEDICAL CARE IF: °· Your medicine does not help your pain. °· You have abnormal vaginal discharge. °SEEK IMMEDIATE MEDICAL CARE IF:  °· You have heavy bleeding from the vagina.   °· Your pelvic pain increases.   °· You feel lightheaded or faint.   °· You have chills.   °· You have pain with urination or blood in your urine.   °· You have uncontrolled   diarrhea or vomiting.   °· You have a fever or persistent symptoms for more than 3 days. °· You have a fever and your symptoms suddenly get worse.   °· You are being physically or sexually abused.   °MAKE SURE YOU: °· Understand these instructions. °· Will watch your condition. °· Will get help if you are not doing well or get worse. °Document  Released: 08/20/2004 Document Revised: 03/24/2012 Document Reviewed: 01/13/2012 °ExitCare® Patient Information ©2014 [employer]. ° °

## 2014-02-02 NOTE — Telephone Encounter (Addendum)
4/29  1156  Liadan called and left message that she just got a call from her primary care doctor that since our clinic is sending her to a pain clinic that they will no longer fill her pain meds and she will have to get them from our clinic.  She requests a phone call to discuss this with us.  Per chart review patient called  to request pain med and they did deny her request since we are referring her to clinic and told her to follow up with gyn.  Per chart review pateint last got 30 tramadol on 01/27/14.  Did not keep her appointment for her lupron despite being instructed to do so by Diane, RN to help with the pain.  Called patient and unable to leave a message- heard a message voicemail is full and not accepting messages.  Patient needs to be told she must come in and get her lupron first and then plan per Dr. Debroah LoopArnold as to how often she can get a refill.  See multiple messages in chart re: pain meds. Probably needs to do a pain contract.  4/29  1455  Pt left new voice mail message @ 1222 stating that she went to the ED due to nausea and vomiting. She missed our earlier call and requests a call back.  [facility] 4/29  1610  Called pt and discussed her concern.  She stated that she is getting the run around because her PCP does not want to give pain med refills and Dr. Debroah LoopArnold doesn't want to either. She further states that she wouldn't be in pain for no reason, she is not crazy and she is in pain 24/7. I acknowledged her frustration and stated that our office can continue to provide pain medication until she gets an appt [facility], however Dr. Debroah LoopArnold wants her to get the Lupron injection first. Also she will need to only obtain pain med from our office.  Pt states she still has some reservations about the injection because she is worried about the side effect of moodiness. I stated that we can talk to her more about it tomorrow.  Pt agreed to come in for injection at 0800 tomorrow.

## 2014-02-02 NOTE — Telephone Encounter (Signed)
No refills at this time.  If pt is seeing GYN for pain and they are sending her to pain management I will not provide ANY pain medications.  I have not seen pt since January and she has more than enough tramadol for pain due to her multiple refills.

## 2014-02-02 NOTE — Telephone Encounter (Signed)
Patient has been made aware of Dr.Tabori's below recommendations and has been made aware to follow up with GYN for pain management. Patient said they told her to call us and I made her aware that Dr.Tabori has declined the request to refill Tramadol. Patient voiced understanding.      KP

## 2014-02-02 NOTE — ED Notes (Signed)
Per pt sts lower and right sided abdominal pain that started last night with vomiting. Denies diarrhea. Denies vaginal or urinary problems.

## 2014-02-03 ENCOUNTER — Ambulatory Visit (INDEPENDENT_AMBULATORY_CARE_PROVIDER_SITE_OTHER): Payer: 59 | Admitting: *Deleted

## 2014-02-03 DIAGNOSIS — N809 Endometriosis, unspecified: Secondary | ICD-10-CM

## 2014-02-03 DIAGNOSIS — G8929 Other chronic pain: Secondary | ICD-10-CM

## 2014-02-03 DIAGNOSIS — R102 Pelvic and perineal pain: Secondary | ICD-10-CM

## 2014-02-03 DIAGNOSIS — N949 Unspecified condition associated with female genital organs and menstrual cycle: Secondary | ICD-10-CM

## 2014-02-03 NOTE — Progress Notes (Signed)
Lupron Depo injection given.  Pt counseled regarding pain medication refills and need to only obtain from our office.  Pt agreed and signed pain contract.  Urine specimen obtained for UDS per contract. Refill for Tramadol given to pt.

## 2014-02-03 NOTE — Addendum Note (Signed)
Addended by: Sherre LainASH, AMANDA A on: 02/03/2014 09:08 AM   Modules accepted: Orders

## 2014-02-04 ENCOUNTER — Telehealth: Payer: Self-pay | Admitting: Family Medicine

## 2014-02-04 NOTE — Telephone Encounter (Signed)
Caller name: Clancy Relation to NW:GNFApt:self  Call back number:[phone]   Reason for call: Pt wants to change providers from Dr. Beverely Lowabori to Dr. Laury AxonLowne.  Pt states that she is unhappy with current PCP.  Please advise.

## 2014-02-04 NOTE — Telephone Encounter (Signed)
Fine with me

## 2014-02-04 NOTE — Telephone Encounter (Signed)
Ok w/ me but please be warned, pt is unhappy w/ me b/c I won't provide pain meds (b/c she is getting them from GYN and has been referred for pain management)

## 2014-02-04 NOTE — Telephone Encounter (Signed)
i would agree with the decision Dr Beverely Lowabori made--- I would not give pt her pain meds either.   So if that is what the pt wants she will not get it from me either

## 2014-02-04 NOTE — Telephone Encounter (Signed)
5.1.15   Advised pt of doctors decisions and advised pt in regards to the pain medications.  Pt stated that she was getting pain management and was not going to ask for pain meds, just wanted to change because she was unhappy only.  Again, made pt aware that pain medications would not be prescribed.

## 2014-02-07 ENCOUNTER — Telehealth: Payer: Self-pay | Admitting: Family Medicine

## 2014-02-07 ENCOUNTER — Telehealth: Payer: Self-pay | Admitting: *Deleted

## 2014-02-07 NOTE — Telephone Encounter (Signed)
Last seen and filled on 01/27/14 #30. No UDS or contract on file    Please advise     KP

## 2014-02-07 NOTE — Telephone Encounter (Signed)
Caller name:Tammee Sydney Relation to UE:AVWUJWJpt:patient Call back number: [phone] Pharmacy:[facility] on [address]  Reason for call: to request a refill for Xanax

## 2014-02-07 NOTE — Telephone Encounter (Signed)
Rx has not been faxed as provider realized it was too soon to fill the Rx.     KP

## 2014-02-07 NOTE — Telephone Encounter (Signed)
Spoke with patient and made her aware it was too soon to refill the patient. She stated she is having to take 3 of the Xanax daily. Please advise    KP

## 2014-02-07 NOTE — Telephone Encounter (Signed)
Refill #60  --no refills ----  uds needed If she needs xanax tid and taking brintellix --- she may need psych to help with meds

## 2014-02-07 NOTE — Telephone Encounter (Signed)
Patient called nurse line concerning headaches since receiving Depo Lupron.  Contacted patient and informed that can be a normal side effect if she gets headaches with hormonal changes.  Reviewed list of normal side effects.  Pt states she is having difficulty urinating and urgency, instructed patient to provide a urine sample at the clinic so we can test for urinary tract infection.  Pt verbalizes understanding.

## 2014-02-08 LAB — OXYCODONE, URINE (LC/MS-MS)
Noroxycodone, Ur: 1703 ng/mL — AB (ref <50)
OXYCODONE, UR: 256 ng/mL — AB (ref <50)
OXYMORPHONE, URINE: 1059 ng/mL — AB (ref <50)

## 2014-02-08 LAB — BENZODIAZEPINES (GC/LC/MS), URINE
Alprazolam metabolite (GC/LC/MS), ur confirm: 59 ng/mL — AB (ref <25)
Clonazepam metabolite (GC/LC/MS), ur confirm: NEGATIVE ng/mL (ref <25)
Flurazepam metabolite (GC/LC/MS), ur confirm: NEGATIVE ng/mL (ref <50)
LORAZEPAMU: NEGATIVE ng/mL (ref <50)
MIDAZOLAMU: NEGATIVE ng/mL (ref <50)
Nordiazepam (GC/LC/MS), ur confirm: NEGATIVE ng/mL (ref <50)
OXAZEPAMU: NEGATIVE ng/mL (ref <50)
TEMAZEPAMU: NEGATIVE ng/mL (ref <50)
Triazolam metabolite (GC/LC/MS), ur confirm: NEGATIVE ng/mL (ref <50)

## 2014-02-08 LAB — OPIATES/OPIOIDS (LC/MS-MS)
Codeine Urine: NEGATIVE ng/mL (ref <50)
Hydrocodone: NEGATIVE ng/mL (ref <50)
Hydromorphone: NEGATIVE ng/mL (ref <50)
MORPHINE: NEGATIVE ng/mL (ref <50)
Norhydrocodone, Ur: NEGATIVE ng/mL (ref <50)
Noroxycodone, Ur: 1703 ng/mL — AB (ref <50)
OXYMORPHONE, URINE: 1059 ng/mL — AB (ref <50)
Oxycodone, ur: 256 ng/mL — AB (ref <50)

## 2014-02-08 LAB — TRAMADOL, URINE
N-DESMETHYL-CIS-TRAMADOL: 181 ng/mL — AB (ref <100)
TRAMADOL, URINE: 273 ng/mL — AB (ref <100)

## 2014-02-08 MED ORDER — ALPRAZOLAM 0.25 MG PO TABS: ORAL_TABLET | ORAL | Status: DC

## 2014-02-08 NOTE — Telephone Encounter (Signed)
Pt called to check on status of med refill.  Informed pt that it was in process and someone would contact her when it was ready.

## 2014-02-08 NOTE — Telephone Encounter (Signed)
Psych list given.    KP

## 2014-02-08 NOTE — Addendum Note (Signed)
Addended by: Arnette NorrisPAYNE, Finlay Godbee P on: 02/08/2014 11:02 AM   Modules accepted: Orders

## 2014-02-08 NOTE — Telephone Encounter (Signed)
Patient has been made aware that the Rx is ready for pick up and has agreed to psych. Contract printed and left at check in for UDS.    KP

## 2014-02-09 ENCOUNTER — Telehealth: Payer: Self-pay | Admitting: General Practice

## 2014-02-09 DIAGNOSIS — G8929 Other chronic pain: Secondary | ICD-10-CM

## 2014-02-09 DIAGNOSIS — R102 Pelvic and perineal pain: Principal | ICD-10-CM

## 2014-02-09 LAB — PRESCRIPTION MONITORING PROFILE (19 PANEL)
Amphetamine/Meth: NEGATIVE ng/mL
Barbiturate Screen, Urine: NEGATIVE ng/mL
Buprenorphine, Urine: NEGATIVE ng/mL
CARISOPRODOL, URINE: NEGATIVE ng/mL
CREATININE, URINE: 425.19 mg/dL (ref >20.0)
Cannabinoid Scrn, Ur: NEGATIVE ng/mL
Cocaine Metabolites: NEGATIVE ng/mL
Fentanyl, Ur: NEGATIVE ng/mL
MDMA URINE: NEGATIVE ng/mL
Meperidine, Ur: NEGATIVE ng/mL
Methadone Screen, Urine: NEGATIVE ng/mL
Methaqualone: NEGATIVE ng/mL
NITRITES URINE, INITIAL: NEGATIVE ug/mL
PH URINE, INITIAL: 5.8 pH (ref 4.5–8.9)
PROPOXYPHENE: NEGATIVE ng/mL
Phencyclidine, Ur: NEGATIVE ng/mL
Tapentadol, urine: NEGATIVE ng/mL
Zolpidem, Urine: NEGATIVE ng/mL

## 2014-02-09 NOTE — Telephone Encounter (Signed)
Patient called and left message stating she wants a refill on her tramadol and knows that she is a day early so we can take our time.

## 2014-02-10 ENCOUNTER — Other Ambulatory Visit: Payer: Self-pay | Admitting: Obstetrics & Gynecology

## 2014-02-10 ENCOUNTER — Telehealth: Payer: Self-pay | Admitting: *Deleted

## 2014-02-10 MED ORDER — TRAMADOL HCL 50 MG PO TABS: 50.0000 mg | ORAL_TABLET | Freq: Four times a day (QID) | ORAL | Status: DC | PRN

## 2014-02-10 NOTE — Telephone Encounter (Signed)
Called pt. And informed her that prescription has been refilled and she can come by clinic to pick it up. Asked pt. About symptoms for UTI, pt. Denies burning with urination but describes discomfort and incontinence. Informed pt. She can leave a urine sample when she comes to pick up prescription and that we may run it for culture and treat if shows infection. Pt. Verbalized understanding and gratitude and stated she may not be here until to morrow; Advised pt. We do close at 1200 on Friday and she must be here before then. Pt. Verbalized understanding. No further questions or concerns.

## 2014-02-10 NOTE — Telephone Encounter (Signed)
Pt. Called back requesting refill of tramadol. Pt. Also states she thinks she has a UTI because she has been unable to hold her bladder while she is sleeping as well as during the day.

## 2014-02-10 NOTE — Telephone Encounter (Signed)
Pt called to check on Tramadol medication and request that it is called in to her pharmacy.  Prescription called in to her pharmacy.

## 2014-02-15 ENCOUNTER — Telehealth: Payer: Self-pay | Admitting: *Deleted

## 2014-02-15 ENCOUNTER — Inpatient Hospital Stay (HOSPITAL_COMMUNITY)
Admission: AD | Admit: 2014-02-15 | Discharge: 2014-02-15 | Disposition: A | Discharge status: Home / Self Care | Payer: 59 | Source: Ambulatory Visit | Attending: Obstetrics & Gynecology | Admitting: Obstetrics & Gynecology

## 2014-02-15 ENCOUNTER — Encounter (HOSPITAL_COMMUNITY): Payer: Self-pay | Admitting: *Deleted

## 2014-02-15 DIAGNOSIS — Z87891 Personal history of nicotine dependence: Secondary | ICD-10-CM | POA: Insufficient documentation

## 2014-02-15 DIAGNOSIS — G8929 Other chronic pain: Secondary | ICD-10-CM | POA: Insufficient documentation

## 2014-02-15 DIAGNOSIS — R102 Pelvic and perineal pain: Secondary | ICD-10-CM

## 2014-02-15 DIAGNOSIS — N949 Unspecified condition associated with female genital organs and menstrual cycle: Secondary | ICD-10-CM | POA: Insufficient documentation

## 2014-02-15 DIAGNOSIS — R109 Unspecified abdominal pain: Secondary | ICD-10-CM | POA: Insufficient documentation

## 2014-02-15 HISTORY — DX: Infectious mononucleosis, unspecified without complication: B27.90

## 2014-02-15 HISTORY — DX: Unspecified ovarian cyst, unspecified side: N83.209

## 2014-02-15 HISTORY — DX: Anxiety disorder, unspecified: F41.9

## 2014-02-15 LAB — URINALYSIS, ROUTINE W REFLEX MICROSCOPIC
BILIRUBIN URINE: NEGATIVE
Glucose, UA: NEGATIVE mg/dL
HGB URINE DIPSTICK: NEGATIVE
KETONES UR: 15 mg/dL — AB
Leukocytes, UA: NEGATIVE
Nitrite: NEGATIVE
PROTEIN: NEGATIVE mg/dL
Specific Gravity, Urine: >1.03 — ABNORMAL HIGH (ref 1.005–1.030)
UROBILINOGEN UA: 0.2 mg/dL (ref 0.0–1.0)
pH: 6 (ref 5.0–8.0)

## 2014-02-15 LAB — WET PREP, GENITAL
CLUE CELLS WET PREP: NONE SEEN
TRICH WET PREP: NONE SEEN
YEAST WET PREP: NONE SEEN

## 2014-02-15 LAB — POCT PREGNANCY, URINE: PREG TEST UR: NEGATIVE

## 2014-02-15 MED ORDER — DEXTROSE 5 % IN LACTATED RINGERS IV BOLUS
1000.0000 mL | Freq: Once | INTRAVENOUS | Status: AC
  Administered 2014-02-15: 1000 mL via INTRAVENOUS

## 2014-02-15 MED ORDER — DEXTROSE 5 % IN LACTATED RINGERS IV BOLUS
1000.0000 mL | Freq: Once | INTRAVENOUS | Status: AC
Start: 2014-02-15 — End: 2014-02-15
  Administered 2014-02-15: 1000 mL via INTRAVENOUS

## 2014-02-15 MED ORDER — ONDANSETRON 8 MG PO TBDP
8.0000 mg | ORAL_TABLET | Freq: Once | ORAL | Status: AC
  Administered 2014-02-15: 8 mg via ORAL
  Filled 2014-02-15: qty 1

## 2014-02-15 MED ORDER — PROMETHAZINE HCL 25 MG/ML IJ SOLN
25.0000 mg | Freq: Once | INTRAMUSCULAR | Status: AC
Start: 2014-02-15 — End: 2014-02-15
  Administered 2014-02-15: 25 mg via INTRAVENOUS
  Filled 2014-02-15: qty 1

## 2014-02-15 MED ORDER — TRAMADOL HCL 50 MG PO TABS
100.0000 mg | ORAL_TABLET | Freq: Once | ORAL | Status: AC
  Administered 2014-02-15: 100 mg via ORAL
  Filled 2014-02-15: qty 2

## 2014-02-15 MED ORDER — KETOROLAC TROMETHAMINE 30 MG/ML IJ SOLN
30.0000 mg | Freq: Once | INTRAMUSCULAR | Status: AC
  Administered 2014-02-15: 30 mg via INTRAVENOUS
  Filled 2014-02-15: qty 1

## 2014-02-15 MED ORDER — PROMETHAZINE HCL 25 MG PO TABS: 25.0000 mg | ORAL_TABLET | Freq: Four times a day (QID) | ORAL | Status: DC | PRN

## 2014-02-15 NOTE — Discharge Instructions (Signed)

## 2014-02-15 NOTE — Telephone Encounter (Signed)
Pt left message stating that she has been taking the Tramadol and is still having a lot of pain. She is requesting advice as to what she should do- did not want to go to ED unless directed to do so. Please call back.

## 2014-02-15 NOTE — Telephone Encounter (Signed)
Called patient and she stated that she was up all night with nausea and pain. Asked patient if she was taking the tramadol as prescribed and she stated yes that's what got her through the night with the pain. Told patient that's what we give her the medication for, to help with her pain and if she finds her pain is still severe even after the tramadol then we would recommend she go to [facility]U and she can let them know she has a pain contract with us for tramadol. Patient verbalized understanding and had no further questions

## 2014-02-15 NOTE — MAU Note (Signed)
Patient states she has a history of endometriosis and ha been having abdominal pain since yesterday. [facility] and was told to come to [facility] for evaluation. Denies bleeding or discharge. Has a headache, lightheaded, nauseated and has lost weight.

## 2014-02-15 NOTE — MAU Provider Note (Signed)
History     CSN: [id number]  Arrival date and time: 02/15/14 1136   First Provider Initiated Contact with Patient 02/15/14 1318      Chief Complaint  Patient presents with  . Abdominal Pain   HPI   RN note: Jenna Wendelyn Breslow, RN Registered Nurse Signed  MAU Note Service date: 02/15/2014 1:10 PM   States that she has been dealing with pelvic and lower abdominal pain for past 8 years; pain is coming from her endometriosis and pt has a pain contract with Dr Roselie Awkward- she is on Tramadol but she says the Tramadol is not holding back her pain; Pain Management is suppose to have called pt to make an appointment but they have not yet; states that she is going to call the Clinic to see Dr Roselie Awkward tomorrow    Past Medical History  Diagnosis Date  . Endometriosis   . Depression     Diagnosed at age 13  . Anxiety   . Ovarian cyst   . Mononucleosis     Past Surgical History  Procedure Laterality Date  . Cholecystectomy    . Tonsillectomy    . Laparoscopic endometriosis fulguration    . Laparoscopy N/A 01/04/2014    Procedure: LAPAROSCOPY DIAGNOSTIC;  Surgeon: Woodroe Mode, MD;  Location: [place] [facility];  Service: Gynecology;  Laterality: N/A;    Family History  Problem Relation Age of Onset  . Hypertension Father   . Diabetes Maternal Grandmother     History  Substance Use Topics  . Smoking status: Former Smoker -- 0.50 packs/day    Types: Cigarettes  . Smokeless tobacco: Never Used  . Alcohol Use: No     Comment: Former user    Allergies:  Allergies  Allergen Reactions  . Ativan [Lorazepam] Other (See Comments)    Makes her feel crazy  . Betadine [Povidone Iodine] Hives  . Reglan [Metoclopramide] Other (See Comments)    "bad thoughts, makes me feel weird."  . Amoxicillin Rash    Prescriptions prior to admission  Medication Sig Dispense Refill  . ALPRAZolam (XANAX) 0.25 MG tablet TAKE 1 TABLET BY MOUTH THREE TIMES DAILY AS NEEDED FOR ANXIETY  60 tablet  0  .  belladona alk-PHENObarbital (DONNATAL) 16.2 MG tablet Take 1 tablet by mouth every 8 (eight) hours as needed.  120 tablet  0  . celecoxib (CELEBREX) 100 MG capsule Take 1-2 capsules (100-200 mg total) by mouth 2 (two) times daily.  60 capsule  6  . gabapentin (NEURONTIN) 600 MG tablet Take 1 tablet (600 mg total) by mouth at bedtime.  30 tablet  3  . levonorgestrel-ethinyl estradiol (SEASONALE) 0.15-0.03 MG tablet Take 1 tablet by mouth daily.  1 Package  11  . medroxyPROGESTERone (PROVERA) 5 MG tablet Take 1 tablet (5 mg total) by mouth daily.  30 tablet  5  . promethazine (PHENERGAN) 25 MG suppository Place 1 suppository (25 mg total) rectally every 6 (six) hours as needed for nausea or vomiting.  12 each  0  . promethazine (PHENERGAN) 25 MG tablet Take 1 tablet (25 mg total) by mouth every 6 (six) hours as needed for nausea or vomiting.  30 tablet  0  . traMADol (ULTRAM) 50 MG tablet Take 1 tablet (50 mg total) by mouth every 6 (six) hours as needed for moderate pain or severe pain.  30 tablet  0  . Vortioxetine HBr (BRINTELLIX) 20 MG TABS 1 po qd  30 tablet  5    Review  of Systems  Constitutional: Negative for fever and chills.  Gastrointestinal: Positive for nausea, abdominal pain and diarrhea. Negative for constipation.  Genitourinary: Negative for dysuria and urgency.  Neurological: Positive for headaches.   Physical Exam   Blood pressure 108/64, pulse 90, temperature 98.4 F (36.9 C), temperature source Oral, resp. rate 16, height '5\' 5"'  (1.651 m), weight 45.723 kg (100 lb 12.8 oz), last menstrual period 01/17/2014, SpO2 99.00%.  Physical Exam  Nursing note and vitals reviewed. Constitutional: She is oriented to person, place, and time. She appears well-developed and well-nourished. She appears distressed.  HENT:  Head: Normocephalic.  Eyes: Pupils are equal, round, and reactive to light.  Neck: Normal range of motion. Neck supple.  Cardiovascular: Normal rate.   Respiratory:  Effort normal.  GI: Soft. There is tenderness.  Musculoskeletal: Normal range of motion.  Neurological: She is alert and oriented to person, place, and time.  Skin: Skin is warm and dry.  Psychiatric: She has a normal mood and affect.    MAU Course  Procedures Results for orders placed during the hospital encounter of 02/15/14 (from the past 24 hour(s))  URINALYSIS, ROUTINE W REFLEX MICROSCOPIC     Status: Abnormal   Collection Time    02/15/14 12:00 PM      Result Value Ref Range   Color, Urine YELLOW  YELLOW   APPearance CLEAR  CLEAR   Specific Gravity, Urine >1.030 (*) 1.005 - 1.030   pH 6.0  5.0 - 8.0   Glucose, UA NEGATIVE  NEGATIVE mg/dL   Hgb urine dipstick NEGATIVE  NEGATIVE   Bilirubin Urine NEGATIVE  NEGATIVE   Ketones, ur 15 (*) NEGATIVE mg/dL   Protein, ur NEGATIVE  NEGATIVE mg/dL   Urobilinogen, UA 0.2  0.0 - 1.0 mg/dL   Nitrite NEGATIVE  NEGATIVE   Leukocytes, UA NEGATIVE  NEGATIVE  POCT PREGNANCY, URINE     Status: None   Collection Time    02/15/14 12:06 PM      Result Value Ref Range   Preg Test, Ur NEGATIVE  NEGATIVE  WET PREP, GENITAL     Status: Abnormal   Collection Time    02/15/14  2:00 PM      Result Value Ref Range   Yeast Wet Prep HPF POC NONE SEEN  NONE SEEN   Trich, Wet Prep NONE SEEN  NONE SEEN   Clue Cells Wet Prep HPF POC NONE SEEN  NONE SEEN   WBC, Wet Prep HPF POC FEW (*) NONE SEEN  D5LR- 2 liters given Zofran IV given Phenergan given Ultram given which gave some improvement in pain to make it tolerable for pt    Assessment and Plan  Chronic pelvic pain- f/u with pain management   Jenna Blake 02/15/2014, 1:22 PM

## 2014-02-15 NOTE — MAU Note (Signed)
States that she has been dealing with pelvic and lower abdominal pain for past 8 years; pain is coming from her endometriosis and pt has a pain contract with Dr Debroah LoopArnold- she is on Tramadol but she says the Tramadol is not holding back her pain; Pain Management is suppose to have called pt to make an appointment but they have not yet; states that she is going to call the Clinic to see Dr Debroah LoopArnold tomorrow;

## 2014-02-16 ENCOUNTER — Telehealth: Payer: Self-pay | Admitting: *Deleted

## 2014-02-16 ENCOUNTER — Other Ambulatory Visit: Payer: Self-pay | Admitting: Obstetrics & Gynecology

## 2014-02-16 LAB — GC/CHLAMYDIA PROBE AMP
CT PROBE, AMP APTIMA: NEGATIVE
GC Probe RNA: NEGATIVE

## 2014-02-16 NOTE — MAU Provider Note (Signed)
Attestation of Attending Supervision of Advanced Practitioner (CNM/NP): Evaluation and management procedures were performed by the Advanced Practitioner under my supervision and collaboration.  I have reviewed the Advanced Practitioner's note and chart, and I agree with the management and plan.  Owin Vignola Harraway-Smith 3:30 PM     

## 2014-02-16 NOTE — Telephone Encounter (Signed)
Russell states she is calling early about her refill-wants to know if we are going to do that tomorrow?

## 2014-02-17 ENCOUNTER — Other Ambulatory Visit: Payer: Self-pay | Admitting: Obstetrics & Gynecology

## 2014-02-17 NOTE — Telephone Encounter (Signed)
Prescription refilled. Called pt. To inform her; pt. [facility] [address]. Informed pt. I would. Medication called in.

## 2014-02-17 NOTE — Telephone Encounter (Signed)
Pt. Called requesting medication refill. Informed pt. I would speak to Dr. Debroah LoopArnold today and call her back.

## 2014-02-21 ENCOUNTER — Telehealth: Payer: Self-pay | Admitting: *Deleted

## 2014-02-21 NOTE — Telephone Encounter (Signed)
Pt called nurse line requesting call back concerning bleeding after receiving the Depo Lupron injection.  Attempted to call patient, no answer, left message for patient to call clinic.

## 2014-02-23 ENCOUNTER — Inpatient Hospital Stay (HOSPITAL_COMMUNITY)
Admission: AD | Admit: 2014-02-23 | Discharge: 2014-02-23 | Discharge status: Against Medical Advice | Payer: 59 | Source: Ambulatory Visit | Attending: Obstetrics & Gynecology | Admitting: Obstetrics & Gynecology

## 2014-02-23 ENCOUNTER — Other Ambulatory Visit: Payer: Self-pay | Admitting: Obstetrics & Gynecology

## 2014-02-23 ENCOUNTER — Encounter (HOSPITAL_COMMUNITY): Payer: Self-pay | Admitting: General Practice

## 2014-02-23 DIAGNOSIS — N809 Endometriosis, unspecified: Secondary | ICD-10-CM

## 2014-02-23 DIAGNOSIS — N949 Unspecified condition associated with female genital organs and menstrual cycle: Secondary | ICD-10-CM | POA: Insufficient documentation

## 2014-02-23 DIAGNOSIS — Z8249 Family history of ischemic heart disease and other diseases of the circulatory system: Secondary | ICD-10-CM | POA: Insufficient documentation

## 2014-02-23 DIAGNOSIS — G8929 Other chronic pain: Secondary | ICD-10-CM | POA: Insufficient documentation

## 2014-02-23 DIAGNOSIS — Z87891 Personal history of nicotine dependence: Secondary | ICD-10-CM | POA: Insufficient documentation

## 2014-02-23 DIAGNOSIS — R109 Unspecified abdominal pain: Secondary | ICD-10-CM | POA: Insufficient documentation

## 2014-02-23 DIAGNOSIS — R102 Pelvic and perineal pain: Secondary | ICD-10-CM

## 2014-02-23 MED ORDER — TRAMADOL-ACETAMINOPHEN 37.5-325 MG PO TABS: 1.0000 | ORAL_TABLET | Freq: Four times a day (QID) | ORAL | Status: DC | PRN

## 2014-02-23 NOTE — MAU Provider Note (Signed)
History     CSN: [id number]  Arrival date and time: 02/23/14 1236   First Provider Initiated Contact with Patient 02/23/14 1528      Chief Complaint  Patient presents with  . Abdominal Pain   HPI Comments: Jenna Blake 23 y.o. G0P0000 presents to MAU with pelvic pain. She called the GYN office today for early refill on ultram and was declined. She came to MAU for pain. She has a pain contract with [facility].   Abdominal Pain      Past Medical History  Diagnosis Date  . Endometriosis   . Depression     Diagnosed at age 23  . Anxiety   . Ovarian cyst   . Mononucleosis     Past Surgical History  Procedure Laterality Date  . Cholecystectomy    . Tonsillectomy    . Laparoscopic endometriosis fulguration    . Laparoscopy N/A 01/04/2014    Procedure: LAPAROSCOPY DIAGNOSTIC;  Surgeon: Adam PhenixJames G Arnold, MD;  Location: [facility];  Service: Gynecology;  Laterality: N/A;    Family History  Problem Relation Age of Onset  . Hypertension Father   . Diabetes Maternal Grandmother     History  Substance Use Topics  . Smoking status: Former Smoker -- 0.50 packs/day    Types: Cigarettes  . Smokeless tobacco: Never Used  . Alcohol Use: No     Comment: Former user    Allergies:  Allergies  Allergen Reactions  . Ativan [Lorazepam] Other (See Comments)    Makes her feel crazy  . Betadine [Povidone Iodine] Hives  . Reglan [Metoclopramide] Other (See Comments)    "bad thoughts, makes me feel weird."  . Amoxicillin Rash    Prescriptions prior to admission  Medication Sig Dispense Refill  . ALPRAZolam (XANAX) 0.25 MG tablet TAKE 1 TABLET BY MOUTH THREE TIMES DAILY AS NEEDED FOR ANXIETY  60 tablet  0  . celecoxib (CELEBREX) 100 MG capsule Take 1-2 capsules (100-200 mg total) by mouth 2 (two) times daily.  60 capsule  6  . gabapentin (NEURONTIN) 600 MG tablet Take 1 tablet (600 mg total) by mouth at bedtime.  30 tablet  3  . Leuprolide Acetate (LUPRON DEPOT IM) Inject into the  muscle.      . medroxyPROGESTERone (PROVERA) 5 MG tablet Take 1 tablet (5 mg total) by mouth daily.  30 tablet  5  . ondansetron (ZOFRAN-ODT) 4 MG disintegrating tablet Take 4 mg by mouth every 8 (eight) hours as needed for nausea or vomiting.      . promethazine (PHENERGAN) 25 MG tablet Take 1 tablet (25 mg total) by mouth every 6 (six) hours as needed for nausea or vomiting.  30 tablet  0  . traMADol (ULTRAM) 50 MG tablet TAKE 1 TABLET EVERY 6 HOURS AS NEEDED MODERATE TO SEVERE PAIN  30 tablet  0  . Vortioxetine HBr (BRINTELLIX) 20 MG TABS 1 po qd  30 tablet  5    Review of Systems  Constitutional: Negative.   HENT: Negative.   Eyes: Negative.   Respiratory: Negative.   Cardiovascular: Negative.   Gastrointestinal: Positive for abdominal pain.  Genitourinary: Negative.   Musculoskeletal: Negative.   Skin: Negative.   Neurological: Negative.   Psychiatric/Behavioral: Negative.    Physical Exam   Blood pressure 133/71, pulse 96, temperature 97.8 F (36.6 C), temperature source Axillary, resp. rate 16, height 5\' 4"  (1.626 m), weight 46.267 kg (102 lb), last menstrual period 02/17/2014.  Physical Exam  Constitutional:  Pt declined any examination    MAU Course  Procedures  MDM  Spoke with Dr Debroah LoopArnold who advised she could have Toradol/ no pain medications  Assessment and Plan   A: Chronic Pelvic pain with pain contract  P: Pt declined any examination and declined Toradol    Doralee AlbinoLinda M Johnnetta Holstine 02/23/2014, 3:51 PM

## 2014-02-23 NOTE — MAU Note (Signed)
Pt has a history of endometriosis. Was in MAU last week for the same pain last week.. Pt states that she was here for 6 hours trying to get her pain under control. Pt was referred to pain management last time she was here, but has not yet received a phone call. Pt is tearful. RN offered pt a heating pad, pt refused at this time.

## 2014-02-23 NOTE — MAU Note (Signed)
Having a lot of abd pain, was here last wk for the same thing.  Here a lot with the endometriosis.  Just can't get the pain down today.

## 2014-02-23 NOTE — MAU Note (Signed)
Pt left unit after speaking with NP about pain management. Pt states [facility] is having [facility]. Pt denies asking for early refill. Pt voices frustration about not being able to get pain meds when she was told to come here. States there is no point in being here if we will not do anything for her.

## 2014-02-23 NOTE — Telephone Encounter (Addendum)
5/20  1004  Pt left additional message stating that she has questions about her period and the Lupron injection.  5/20  1305  Called pt to discuss her status of pain and heavy period. I explained that the Lupron injection which she received is designed to work over time and the medication lasts for 3 months. Hopefully, her pain and amount of bleeding will become less as time goes on. What she is experiencing is normal. We will not know for several more months if the Lupron will help with her symptoms as the medication does not work the same for all patients. I encouraged pt to continue taking the celebrex twice daily and the Tramadol when needed as prescribed.  Pt then told me that she came to the hospital and is [facility]. She further stated that she "could not just sit at home being in so much pain and not being able to go to the ED" due to her pain contract. I advised pt that I was not sure what else can be done for her but since she is there, we will see about the outcome of her evaluation.  Pt voiced understanding.

## 2014-02-23 NOTE — Discharge Instructions (Signed)

## 2014-02-25 ENCOUNTER — Telehealth: Payer: Self-pay | Admitting: *Deleted

## 2014-02-25 NOTE — Telephone Encounter (Signed)
I called pt and informed her that I have scheduled her appointment with [facility] pain clinic on 04/20/14 @ 0900.  She also has been placed on their cancellation list for an earlier appt if it should become available. She will receive a reminder and information in the mail from their office.  Pt voiced understanding and was very happy and appreciative.

## 2014-03-01 ENCOUNTER — Telehealth: Payer: Self-pay | Admitting: *Deleted

## 2014-03-01 DIAGNOSIS — N809 Endometriosis, unspecified: Secondary | ICD-10-CM

## 2014-03-01 DIAGNOSIS — N949 Unspecified condition associated with female genital organs and menstrual cycle: Secondary | ICD-10-CM

## 2014-03-01 MED ORDER — TRAMADOL HCL 50 MG PO TABS: ORAL_TABLET | ORAL | Status: DC

## 2014-03-01 NOTE — Telephone Encounter (Signed)
Pt called nurse line requesting refill of pain medication, no other information given.  Spoke with patient who states she needs a refill on the tramadol.  In system it states 30 tabs were ordered on 02/23/2014 and pt picked up prescription.  Pt states that it lasted for four days only.  Informed patient I would send note to Dr. Debroah Loop for advice on plan.  Pt verbalizes understanding.

## 2014-03-01 NOTE — Telephone Encounter (Signed)
Jenna Blake called again asking to see if her refill approved. Informed her Dr. Debroah Loop not in clinic, message sent, but I would see if I could locate him and ask, then call her back. She reports she is not out of her med, but will be out today or tomorrow. Called Dr. Debroah Loop and reviewed her medication history- due to still using ultracet frequently will switch back to Tramadol. Rx Given and called to pharmacy. Called [name] and left message refill approved and sent to her pharmacy, call if questions.

## 2014-03-03 ENCOUNTER — Encounter: Payer: Self-pay | Admitting: *Deleted

## 2014-03-07 ENCOUNTER — Telehealth: Payer: Self-pay | Admitting: *Deleted

## 2014-03-07 ENCOUNTER — Telehealth: Payer: Self-pay | Admitting: Family Medicine

## 2014-03-07 DIAGNOSIS — N809 Endometriosis, unspecified: Secondary | ICD-10-CM

## 2014-03-07 DIAGNOSIS — N949 Unspecified condition associated with female genital organs and menstrual cycle: Secondary | ICD-10-CM

## 2014-03-07 NOTE — Telephone Encounter (Signed)
Last seen 01/27/14 and filled 02/08/14 360. Please advise     KP

## 2014-03-07 NOTE — Telephone Encounter (Signed)
Refill x1 

## 2014-03-07 NOTE — Telephone Encounter (Signed)
Patient called and stated that she has a question about bleeding after getting the Lupron injection. She also wanted to see if she can get her refill on Tramadol.

## 2014-03-07 NOTE — Telephone Encounter (Signed)
Caller name:Jenna Blake Relation to pt: Patient Call back number: [phone] Pharmacy:Pharmacy: [id number] - [place], [place] - [address] AT [facility] [address] & [facility] [address]   Reason for call: Patient called to request a refill for Xanax

## 2014-03-08 MED ORDER — ALPRAZOLAM 0.25 MG PO TABS: ORAL_TABLET | ORAL | Status: DC

## 2014-03-08 NOTE — Telephone Encounter (Signed)
Routed to Dr Arnold

## 2014-03-08 NOTE — Addendum Note (Signed)
Addended by: Arnette Norris on: 03/08/2014 08:04 AM   Modules accepted: Orders

## 2014-03-08 NOTE — Telephone Encounter (Signed)
Rx sent to [facility]. Fax number [phone]      KP

## 2014-03-08 NOTE — Telephone Encounter (Signed)
Jenna Blake called again and left a message she called yesterday about her bleeding- wanting know if it is normal to be bleeding after Lupron Depot and also calling about her refill.  Has been forwarded to Dr. Debroah Loop. Called Dr. Debroah Loop and informed him Jenna Blake called again- he will review her chart and notify us later today.  Someone will call her after that.

## 2014-03-09 NOTE — Telephone Encounter (Signed)
Patient called stating she would like a call back. Called patient and informed her we are awaiting Dr. Olivia Mackie response for tramadol refill. Patient verbalized understanding. Patient stated she was also concerned because she has been bleeding since the injection and was told last week after a visit to MAU that if she were to continue to bleed she should return. Informed patient that bleeding is a normal side affect and may take several weeks to subside. Asked patient how often she is changing a pad. Patient states she is changing a pad 2-3 times an hour and using a tampon--- states she is bleeding so heavily that the blood is soaking 2-3 overnight pads an hour even with the tampon. Informed patient that soaking more than one pad an hour is concerning and patient should go to MAU. Patient verbalized understanding and stated she would. NO further questions or concerns.

## 2014-03-10 MED ORDER — TRAMADOL HCL 50 MG PO TABS: ORAL_TABLET | ORAL | Status: DC

## 2014-03-10 NOTE — Telephone Encounter (Signed)
Spoke with Dr. Debroah Loop in clinic and he gave permission for a refill of pt's tramadol 50 mg po q 6 qty 30.  [facility] [address]  And called pt to inform her that her Rx at her pharmacy.  Pt stated "thank you" with no further questions.

## 2014-03-16 ENCOUNTER — Telehealth: Payer: Self-pay | Admitting: *Deleted

## 2014-03-16 NOTE — Telephone Encounter (Signed)
Wants to know if she can get her refill.

## 2014-03-17 NOTE — Telephone Encounter (Signed)
Spoke with Dr. Macon Large about patient and she gave verbal order to refill Tramadol 50mg  one tablet every 6 hours #30 with no refills. [facility]

## 2014-03-24 ENCOUNTER — Telehealth: Payer: Self-pay | Admitting: *Deleted

## 2014-03-24 NOTE — Telephone Encounter (Signed)
Patient calling a day early to see if she can get her Tramadol refilled.  She also has a question about her bleeding and wanted to see if we have any appointments open for her to come in to see Dr. Debroah LoopArnold.

## 2014-03-24 NOTE — Telephone Encounter (Signed)
Called Dr. Debroah LoopArnold and he gave verbal order for tramadol 50mg  one tablet every 6 hours prn pain. #30 and 0Rf. [facility] Called patient and informed her that rx was sent to pharmacy. She wanted to know if it was normal for her to still bleed after lupron. I informed her that this is a common side effect. Patient has appointment next week with Dr. Debroah LoopArnold.

## 2014-03-28 ENCOUNTER — Telehealth: Payer: Self-pay | Admitting: *Deleted

## 2014-03-28 MED ORDER — ALPRAZOLAM 0.25 MG PO TABS: ORAL_TABLET | ORAL | Status: DC

## 2014-03-28 NOTE — Telephone Encounter (Signed)
Caller name:  [name] Relation to pt:  self Call back [phone]   Pharmacy:  [facility] on [address]  Reason for call:  Pt requesting refill on  ALPRAZolam (XANAX) 0.25 MG tablet  Last refill 03/08/2014, #60, no refills Last OV 01/27/2014  Please advise.  bw

## 2014-03-28 NOTE — Telephone Encounter (Addendum)
Patient has been made aware and [facility] [address]    KP

## 2014-03-28 NOTE — Telephone Encounter (Signed)
Spoke with patient and she stated she is taking the Xanax three times a day and will need a refill because the quantity of 60 only last her 20 days. She has an apt with a counselor next month Please advise    KP

## 2014-03-28 NOTE — Telephone Encounter (Signed)
She needs a psychiatrist-- I'm not comfortable with her taking xanax tid everyday--- will give 1 rx for 90

## 2014-03-30 ENCOUNTER — Ambulatory Visit (INDEPENDENT_AMBULATORY_CARE_PROVIDER_SITE_OTHER): Payer: 59 | Admitting: Obstetrics & Gynecology

## 2014-03-30 ENCOUNTER — Encounter: Payer: Self-pay | Admitting: Obstetrics & Gynecology

## 2014-03-30 VITALS — BP 129/77 | HR 100 | Temp 98.6°F | Ht 63.0 in | Wt 100.6 lb

## 2014-03-30 DIAGNOSIS — R102 Pelvic and perineal pain: Secondary | ICD-10-CM

## 2014-03-30 DIAGNOSIS — G8929 Other chronic pain: Secondary | ICD-10-CM

## 2014-03-30 DIAGNOSIS — N949 Unspecified condition associated with female genital organs and menstrual cycle: Secondary | ICD-10-CM

## 2014-03-30 DIAGNOSIS — N809 Endometriosis, unspecified: Secondary | ICD-10-CM

## 2014-03-30 MED ORDER — MEDROXYPROGESTERONE ACETATE 10 MG PO TABS: 20.0000 mg | ORAL_TABLET | Freq: Every day | ORAL | Status: DC

## 2014-03-30 MED ORDER — TRAMADOL HCL 50 MG PO TABS: ORAL_TABLET | ORAL | Status: DC

## 2014-03-30 NOTE — Progress Notes (Signed)
Patient ID: Jenna Blake, female   DOB: 1991/08/04, 23 y.o.   MRN: [id number] Patient was seen in MAU, still has pain and had dyspareunia. Tramadol not working well enough. [facility]. Will increase Tramadol, Neurontin and Provera dose.  Current Outpatient Prescriptions on File Prior to Visit  Medication Sig Dispense Refill  . ALPRAZolam (XANAX) 0.25 MG tablet TAKE 1 TABLET BY MOUTH THREE TIMES DAILY AS NEEDED FOR ANXIETY  90 tablet  0  . celecoxib (CELEBREX) 100 MG capsule Take 1-2 capsules (100-200 mg total) by mouth 2 (two) times daily.  60 capsule  6  . gabapentin (NEURONTIN) 600 MG tablet Take 1 tablet (600 mg total) by mouth at bedtime.  30 tablet  3  . Leuprolide Acetate (LUPRON DEPOT IM) Inject into the muscle.      . medroxyPROGESTERone (PROVERA) 5 MG tablet Take 1 tablet (5 mg total) by mouth daily.  30 tablet  5  . ondansetron (ZOFRAN-ODT) 4 MG disintegrating tablet Take 4 mg by mouth every 8 (eight) hours as needed for nausea or vomiting.      . promethazine (PHENERGAN) 25 MG tablet Take 1 tablet (25 mg total) by mouth every 6 (six) hours as needed for nausea or vomiting.  30 tablet  0  . traMADol-acetaminophen (ULTRACET) 37.5-325 MG per tablet Take 1-2 tablets by mouth every 6 (six) hours as needed.  30 tablet  0  . Vortioxetine HBr (BRINTELLIX) 20 MG TABS 1 po qd  30 tablet  5   No current facility-administered medications on file prior to visit.   Provera 20 mg daily  Adam PhenixJames G Arnold, MD 03/30/2014

## 2014-03-30 NOTE — Patient Instructions (Signed)
Endometriosis  Endometriosis is a condition in which the tissue that lines the uterus (endometrium) grows outside of its normal location. The tissue may grow in many locations close to the uterus, but it commonly grows on the ovaries, fallopian tubes, vagina, or bowel. Because the uterus expels, or sheds, its lining every menstrual cycle, there is bleeding wherever the endometrial tissue is located. This can cause pain because blood is irritating to tissues not normally exposed to it.   CAUSES   The cause of endometriosis is not known.   SIGNS AND SYMPTOMS   Often, there are no symptoms. When symptoms are present, they can vary with the location of the displaced tissue. Various symptoms can occur at different times. Although symptoms occur mainly during a woman's menstrual period, they can also occur midcycle and usually stop with menopause. Some people may go months with no symptoms at all. Symptoms may include:   · Back or abdominal pain.    · Heavier bleeding during periods.    · Pain during intercourse.    · Painful bowel movements.    · Infertility.  DIAGNOSIS   Your health care provider will do a physical exam and ask about your symptoms. Various tests may be done, such as:   · Blood tests and urine tests. These are done to help rule out other problems.    · Ultrasound. This test is done to look for abnormal tissue.    · An X-ray of the lower bowel (barium enema).   · Laparoscopy. In this procedure, a thin, lighted tube with a tiny camera on the end (laparoscope) is inserted into your abdomen. This helps your health care provider look for abnormal tissue to confirm the diagnosis. The health care provider may also remove a small piece of tissue (biopsy) from any abnormal tissue found. This tissue sample can then be sent to a lab so it can be looked at under a microscope.  TREATMENT   Treatment will vary and may include:   · Medicines to relieve pain.  Nonsteroidal anti-inflammatory drugs (NSAIDs) are a type of  pain medicine that can help to relieve the pain caused by endometriosis.  · Hormonal therapy. When using hormonal therapy, periods are eliminated. This eliminates the monthly exposure to blood by the displaced endometrial tissue.    · Surgery. Surgery may sometimes be done to remove the abnormal endometrial tissue. In severe cases, surgery may be done to remove the fallopian tubes, uterus, and ovaries (hysterectomy).  HOME CARE INSTRUCTIONS   · Only take over-the-counter or prescription medicines for pain, discomfort, or fever as directed by your health care provider. Do not take aspirin because it may increase bleeding when you are not on hormonal therapy.    · Avoid activities that produce pain, including sexual activity.  SEEK MEDICAL CARE IF:  · You have pelvic pain before, after, or during your periods.  · You have pelvic pain between periods that gets worse during your period.  · You have pelvic pain during or after sex.  · You have pelvic pain with bowel movements or urination, especially during your period.  · You have problems getting pregnant.  SEEK IMMEDIATE MEDICAL CARE IF:   · Your pain is severe and is not responding to pain medicine.    · You have severe nausea and vomiting, or you cannot keep foods down.    · You have pain that is limited to the right lower part of your abdomen.    · You have swelling or increasing pain in your abdomen.    ·   You see blood in your stool.    · You have a fever or persistent symptoms for more than 2-3 days.    · You have a fever and your symptoms suddenly get worse.  MAKE SURE YOU:   · Understand these instructions.  · Will watch your condition.  · Will get help right away if you are not doing well or get worse.  Document Released: 09/20/2000 Document Revised: 07/14/2013 Document Reviewed: 05/21/2013  ExitCare® Patient Information ©2015 [employer]. This information is not intended to replace advice given to you by your health care provider. Make sure you discuss any  questions you have with your health care provider.

## 2014-03-30 NOTE — Progress Notes (Signed)
Patient reports pelvic pain along with painful intercourse. States she tried to have sex a month ago and experienced severe pain/numbing sensation along with odor and has not had sex since.

## 2014-03-31 ENCOUNTER — Telehealth: Payer: Self-pay | Admitting: *Deleted

## 2014-03-31 ENCOUNTER — Telehealth: Payer: Self-pay | Admitting: General Practice

## 2014-03-31 ENCOUNTER — Other Ambulatory Visit: Payer: Self-pay | Admitting: Obstetrics & Gynecology

## 2014-03-31 NOTE — Telephone Encounter (Signed)
Per chart review verified Jenna Blake was seen by Dr. Debroah LoopArnold yesterday, he did put in a new refill for tramadol for phone in order. [facility] not [facility] in, order given. [employer] [name] and notified her.

## 2014-03-31 NOTE — Telephone Encounter (Signed)
Jenna Blake called and left a message she wanted to make sure tramadol was sent in to her pharmacy today. States she saw Dr. Debroah LoopArnold yesterday and pretty sure he told her we would. Request a call back.

## 2014-03-31 NOTE — Telephone Encounter (Signed)
Patient had called in this morning stating the ultram didn't go to her pharmacy. [facility] Called patient and informed her of medication called in. Patient verbalized understanding and stated someone had already called and told her. Patient had no other questions

## 2014-04-04 LAB — BENZODIAZEPINES (GC/LC/MS), URINE
Alprazolam metabolite (GC/LC/MS), ur confirm: 3507 ng/mL — AB (ref <25)
Clonazepam metabolite (GC/LC/MS), ur confirm: NEGATIVE ng/mL (ref <25)
FLURAZEPAMU: NEGATIVE ng/mL (ref <50)
Lorazepam (GC/LC/MS), ur confirm: NEGATIVE ng/mL (ref <50)
MIDAZOLAMU: NEGATIVE ng/mL (ref <50)
Nordiazepam (GC/LC/MS), ur confirm: NEGATIVE ng/mL (ref <50)
Oxazepam (GC/LC/MS), ur confirm: NEGATIVE ng/mL (ref <50)
Temazepam (GC/LC/MS), ur confirm: NEGATIVE ng/mL (ref <50)
Triazolam metabolite (GC/LC/MS), ur confirm: NEGATIVE ng/mL (ref <50)

## 2014-04-04 LAB — TRAMADOL, URINE
N-DESMETHYL-CIS-TRAMADOL: 433 ng/mL — AB (ref <100)
Tramadol, Urine: 235 ng/mL — AB (ref <100)

## 2014-04-05 LAB — PRESCRIPTION MONITORING PROFILE (19 PANEL)
Amphetamine/Meth: NEGATIVE ng/mL
BUPRENORPHINE, URINE: NEGATIVE ng/mL
Barbiturate Screen, Urine: NEGATIVE ng/mL
Cannabinoid Scrn, Ur: NEGATIVE ng/mL
Carisoprodol, Urine: NEGATIVE ng/mL
Cocaine Metabolites: NEGATIVE ng/mL
Creatinine, Urine: 161.6 mg/dL (ref >20.0)
ECSTASY: NEGATIVE ng/mL
Fentanyl, Ur: NEGATIVE ng/mL
Meperidine, Ur: NEGATIVE ng/mL
Methadone Screen, Urine: NEGATIVE ng/mL
Methaqualone: NEGATIVE ng/mL
NITRITES URINE, INITIAL: NEGATIVE ug/mL
Opiate Screen, Urine: NEGATIVE ng/mL
Oxycodone Screen, Ur: NEGATIVE ng/mL
PROPOXYPHENE: NEGATIVE ng/mL
Phencyclidine, Ur: NEGATIVE ng/mL
Tapentadol, urine: NEGATIVE ng/mL
ZOLPIDEM, URINE: NEGATIVE ng/mL
pH, Initial: 8.3 pH (ref 4.5–8.9)

## 2014-04-06 ENCOUNTER — Telehealth: Payer: Self-pay | Admitting: *Deleted

## 2014-04-06 DIAGNOSIS — N949 Unspecified condition associated with female genital organs and menstrual cycle: Secondary | ICD-10-CM

## 2014-04-06 DIAGNOSIS — N809 Endometriosis, unspecified: Secondary | ICD-10-CM

## 2014-04-06 MED ORDER — TRAMADOL HCL 50 MG PO TABS: ORAL_TABLET | ORAL | Status: DC

## 2014-04-06 NOTE — Telephone Encounter (Addendum)
Pt left message requesting Rx refill of Tramadol. She stated that she knows it is a [place] early.  I consulted with Dr. Debroah LoopArnold and he agreed to refill Rx and pt may pick it up tomorrow after 3pm.  I called pt and informed her that refill has been approved. She asked for it to be called in to her pharmacy instead of her picking up Rx. I stated that I will do that but it will not be ready until after 3pm.  Pt agreed and voiced understanding.

## 2014-04-12 ENCOUNTER — Telehealth: Payer: Self-pay | Admitting: *Deleted

## 2014-04-12 DIAGNOSIS — N949 Unspecified condition associated with female genital organs and menstrual cycle: Secondary | ICD-10-CM

## 2014-04-12 DIAGNOSIS — N809 Endometriosis, unspecified: Secondary | ICD-10-CM

## 2014-04-12 NOTE — Telephone Encounter (Addendum)
7/7/  1227  Pt left message stating she is calling for her refill of Tramadol. She knows she is calling early- wants to give us enough notice.  I consulted with Dr. Debroah LoopArnold and was given order to call in one final refill tomorrow afternoon. I notified pt that she may pick up her refill tomorrow after 3pm and this will be the final refill as she has an appt with [facility] pain management on 7/15.  Pt stated that her refill is due today and that she picked up the last refill on 7/1. I advised her that the pharmacy was not supposed to release the medication to her until after 3pm on 7/2 and that she had been informed of such during my previous phone call on 7/1.  The refill was not due until 7/2 and so if she got it on 7/1, that was a day early. This refill will not be given until the scheduled date of 7/9 after 3 pm. Pt voiced understanding. 7/9   1515   Rx refill called in to pharmacy as previously discussed w/pt.

## 2014-04-14 ENCOUNTER — Telehealth: Payer: Self-pay | Admitting: General Practice

## 2014-04-14 MED ORDER — TRAMADOL HCL 50 MG PO TABS: ORAL_TABLET | ORAL | Status: DC

## 2014-04-14 NOTE — Telephone Encounter (Signed)
Patient called and left message stating [facility] pills at her pharmacy and she wants to make sure she doesn't need this and also wants to know when her next shot is. Called patient stating I am returning your phone call and that I do not see where we have called in a Rx for birth control pills. I see the prescription for Provera but that's for bleeding not for birth control. Patient verbalized understanding. Told patient her next lupron is due 7/16-7/30. Patient asked about her tramadol Rx and if it had been sent in. Told patient that reading through her chart it looks like it was supposed to have been sent in. Patient verbalized understanding and had no other questions

## 2014-04-21 ENCOUNTER — Telehealth: Payer: Self-pay | Admitting: General Practice

## 2014-04-21 NOTE — Telephone Encounter (Signed)
Patient called and left message stating she had to reschedule her appt at the pain clinic due to a family emergency and had to be out of town for a couple days and now has her appt rescheduled for 8/1 and would like to know if we can continue her tramadol until then and that she has some to last a couple days but not enough till then.

## 2014-04-25 NOTE — Telephone Encounter (Signed)
Patient called requesting RX for tramadol until pain clinic appointment 05/09/14. Informed patient I would send a note to Dr. Debroah LoopArnold and that we would call her once he responds.

## 2014-04-25 NOTE — Telephone Encounter (Signed)
Patient returned call to the clinic requesting refill on Tramadol since she missed her appointment with the pain clinic.  Informed patient that on 04/12/14 D. Day discussed with patient that would be the last refill for the medication.  Pt states, I know but I missed my appointment with the pain clinic due to family emergency.  Informed patient that a message has been sent to Dr. Debroah LoopArnold and we will await his decision, it may take a couple of days, pt verbalized understanding.

## 2014-04-26 ENCOUNTER — Ambulatory Visit (INDEPENDENT_AMBULATORY_CARE_PROVIDER_SITE_OTHER): Payer: 59

## 2014-04-26 VITALS — BP 132/77 | HR 100 | Wt 101.4 lb

## 2014-04-26 DIAGNOSIS — N949 Unspecified condition associated with female genital organs and menstrual cycle: Secondary | ICD-10-CM

## 2014-04-26 DIAGNOSIS — N809 Endometriosis, unspecified: Secondary | ICD-10-CM

## 2014-04-26 MED ORDER — LEUPROLIDE ACETATE (3 MONTH) 11.25 MG IM KIT
11.2500 mg | PACK | Freq: Once | INTRAMUSCULAR | Status: AC
  Administered 2014-04-26: 11.25 mg via INTRAMUSCULAR

## 2014-04-26 MED ORDER — TRAMADOL HCL 50 MG PO TABS: ORAL_TABLET | ORAL | Status: DC

## 2014-04-26 NOTE — Progress Notes (Signed)
Pt came in today for Lupron injection and requested another refill on her tramadol.  I explained to the pt that the last rx that was given was to hold until her pain clinic in which she canceled.  Pt had tears in her eyes and stated that she really had a family emergency that I could not go.  Pt also informed me that she has an appt with the pain clinic on 05/07/14.  I asked that if she would be going to the appt regardless of what happens.  Pt stated "yes".  I advised pt that since she stated that regardless of what happens she would be going to the appt at the Pain clinic I would talk with Dr. Debroah LoopArnold.  I spoke with Dr. Debroah LoopArnold and informed him what the pt told me and requested that pt request for tramadol 50 mg be refilled enough to get her to the appt and then no more after that.  Dr. Debroah LoopArnold agreed with qty 30.  I informed pt that her request for tramadol was filled with qty 30 to her to the appt on 05/07/14 and after that there is no more refills due to the pain clinic will be able to provide the care she needs for managing her pain.  Pt agreed.  Called pt [facility] [address] and phoned in Rx.

## 2014-05-10 ENCOUNTER — Emergency Department (INDEPENDENT_AMBULATORY_CARE_PROVIDER_SITE_OTHER): Payer: 59

## 2014-05-10 ENCOUNTER — Emergency Department (HOSPITAL_COMMUNITY)
Admission: EM | Admit: 2014-05-10 | Discharge: 2014-05-10 | Disposition: A | Discharge status: Home / Self Care | Payer: 59 | Source: Home / Self Care | Attending: Emergency Medicine | Admitting: Emergency Medicine

## 2014-05-10 ENCOUNTER — Encounter (HOSPITAL_COMMUNITY): Payer: Self-pay | Admitting: Emergency Medicine

## 2014-05-10 DIAGNOSIS — M5489 Other dorsalgia: Secondary | ICD-10-CM

## 2014-05-10 DIAGNOSIS — J069 Acute upper respiratory infection, unspecified: Secondary | ICD-10-CM

## 2014-05-10 DIAGNOSIS — J018 Other acute sinusitis: Secondary | ICD-10-CM

## 2014-05-10 DIAGNOSIS — M549 Dorsalgia, unspecified: Secondary | ICD-10-CM

## 2014-05-10 DIAGNOSIS — B9789 Other viral agents as the cause of diseases classified elsewhere: Principal | ICD-10-CM

## 2014-05-10 MED ORDER — HYDROCOD POLST-CHLORPHEN POLST 10-8 MG/5ML PO LQCR: 5.0000 mL | Freq: Two times a day (BID) | ORAL | Status: DC | PRN

## 2014-05-10 MED ORDER — FLUTICASONE PROPIONATE 50 MCG/ACT NA SUSP: 2.0000 | Freq: Two times a day (BID) | NASAL | Status: DC

## 2014-05-10 MED ORDER — METHYLPREDNISOLONE 4 MG PO KIT: PACK | ORAL | Status: DC

## 2014-05-10 NOTE — ED Notes (Signed)
C/o  Productive cough with green sputum.  Headache.    X  3 days.  Also c/o  Right sided lower back pain from a fall.    No relief with otc meds.

## 2014-05-10 NOTE — ED Provider Notes (Signed)
Medical screening examination/treatment/procedure(s) were performed by resident physician or non-physician practitioner and as supervising physician I was immediately available for consultation/collaboration.  Chelse Matas, MD     Johnney Scarlata J Tephanie Escorcia, MD 05/10/14 1805 

## 2014-05-10 NOTE — ED Provider Notes (Addendum)
CSN: [id number]     Arrival date & time 05/10/14  1135 History   First MD Initiated Contact with Patient 05/10/14 1216     Chief Complaint  Patient presents with  . URI  . Fall    injury to back   (Consider location/radiation/quality/duration/timing/severity/associated sxs/prior Treatment) HPI Comments: 33f presents for evaluation of a productive cough with sinus pressure and headache for 3 days, along with sore throat and fatigue. Her symptoms started 3 days ago and have gotten progressively worse. She has been taking over-the-counter medications without significant relief of her symptoms. She denies fever, chills, pleuritic chest pain, shortness of breath. No recent travel or sick contacts. She also complains of right-sided lower back pain after falling 2 days ago. No radiation down her legs. No extremity numbness or weakness, no loss of bowel or bladder control. She says that she takes tramadol chronically for pain from endometriosis and that is not helping the back pain.  Patient is a 23 y.o. female presenting with URI and fall.  URI Presenting symptoms: congestion, cough, ear pain, fatigue, rhinorrhea and sore throat   Presenting symptoms: no fever   Fall Associated symptoms include abdominal pain (chronic). Pertinent negatives include no chest pain and no shortness of breath.    Past Medical History  Diagnosis Date  . Endometriosis   . Depression     Diagnosed at age 73  . Anxiety   . Ovarian cyst   . Mononucleosis    Past Surgical History  Procedure Laterality Date  . Cholecystectomy    . Tonsillectomy    . Laparoscopic endometriosis fulguration    . Laparoscopy N/A 01/04/2014    Procedure: LAPAROSCOPY DIAGNOSTIC;  Surgeon: Adam Phenix, MD;  Location: [facility];  Service: Gynecology;  Laterality: N/A;   Family History  Problem Relation Age of Onset  . Hypertension Father   . Diabetes Maternal Grandmother    History  Substance Use Topics  . Smoking status: Former  Smoker -- 0.50 packs/day    Types: Cigarettes  . Smokeless tobacco: Never Used  . Alcohol Use: No     Comment: Former user   OB History   Grav Para Term Preterm Abortions TAB SAB Ect Mult Living   0 0 0 0 0 0 0 0 0 0      Review of Systems  Constitutional: Positive for chills and fatigue. Negative for fever.  HENT: Positive for congestion, ear pain, rhinorrhea, sinus pressure and sore throat.   Respiratory: Positive for cough. Negative for chest tightness and shortness of breath.   Cardiovascular: Negative for chest pain and leg swelling.  Gastrointestinal: Positive for abdominal pain (chronic).  Genitourinary: Positive for pelvic pain (chronic).  Musculoskeletal: Positive for back pain.  All other systems reviewed and are negative.   Allergies  Ativan; Betadine; Reglan; and Amoxicillin  Home Medications   Prior to Admission medications   Medication Sig Start Date End Date Taking? Authorizing Provider  ALPRAZolam Prudy Feeler) 0.25 MG tablet TAKE 1 TABLET BY MOUTH THREE TIMES DAILY AS NEEDED FOR ANXIETY 03/28/14   Lelon Perla, DO  celecoxib (CELEBREX) 100 MG capsule Take 1-2 capsules (100-200 mg total) by mouth 2 (two) times daily. 12/22/13 12/22/14  Dorathy Kinsman, CNM  chlorpheniramine-HYDROcodone (TUSSIONEX PENNKINETIC ER) 10-8 MG/5ML LQCR Take 5 mLs by mouth every 12 (twelve) hours as needed for cough (or for pain). 05/10/14   Adrian Blackwater Selene Peltzer, PA-C  fluticasone (FLONASE) 50 MCG/ACT nasal spray Place 2 sprays into both nostrils 2 (two)  times daily. Decrease to 2 sprays/nostril daily after 5 days 05/10/14   Graylon Good, PA-C  gabapentin (NEURONTIN) 600 MG tablet Take 1 tablet (600 mg total) by mouth at bedtime. 11/15/13   Adam Phenix, MD  Leuprolide Acetate (LUPRON DEPOT IM) Inject into the muscle.    Historical Provider, MD  medroxyPROGESTERone (PROVERA) 10 MG tablet Take 2 tablets (20 mg total) by mouth daily. 03/30/14   Adam Phenix, MD  methylPREDNISolone (MEDROL DOSEPAK) 4 MG  tablet Use as directed on package instructions 05/10/14   Graylon Good, PA-C  ondansetron (ZOFRAN-ODT) 4 MG disintegrating tablet Take 4 mg by mouth every 8 (eight) hours as needed for nausea or vomiting.    Historical Provider, MD  promethazine (PHENERGAN) 25 MG tablet Take 1 tablet (25 mg total) by mouth every 6 (six) hours as needed for nausea or vomiting. 02/15/14   Jean Rosenthal, NP  traMADol (ULTRAM) 50 MG tablet TAKE 1-2 TABLET EVERY 6 HOURS AS NEEDED MODERATE TO SEVERE PAIN 04/26/14   Adam Phenix, MD  traMADol-acetaminophen (ULTRACET) 37.5-325 MG per tablet Take 1-2 tablets by mouth every 6 (six) hours as needed. 02/23/14   Adam Phenix, MD  Vortioxetine HBr (BRINTELLIX) 20 MG TABS 1 po qd 01/27/14   Grayling Congress Lowne, DO   BP 109/70  Pulse 104  Temp(Src) 98.5 F (36.9 C) (Oral)  Resp 14  SpO2 100% Physical Exam  Nursing note and vitals reviewed. Constitutional: She is oriented to person, place, and time. Vital signs are normal. She appears well-developed and well-nourished. No distress.  HENT:  Head: Normocephalic and atraumatic.  Right Ear: Tympanic membrane, external ear and ear canal normal.  Left Ear: Tympanic membrane, external ear and ear canal normal.  Nose: Right sinus exhibits maxillary sinus tenderness and frontal sinus tenderness. Left sinus exhibits maxillary sinus tenderness and frontal sinus tenderness.  Mouth/Throat: Uvula is midline, oropharynx is clear and moist and mucous membranes are normal.  Eyes: Conjunctivae are normal. Right eye exhibits no discharge. Left eye exhibits no discharge.  Neck: Normal range of motion. Neck supple.  Cardiovascular: Normal rate, regular rhythm and normal heart sounds.   Pulmonary/Chest: Effort normal and breath sounds normal. No respiratory distress. She has no wheezes. She has no rales. She exhibits no tenderness.  Abdominal: Soft. She exhibits no distension and no mass. There is no tenderness. There is no rebound and no  guarding.  Lymphadenopathy:    She has cervical adenopathy (tonsillar, posterior cervical).  Neurological: She is alert and oriented to person, place, and time. She has normal strength. Coordination normal.  Skin: Skin is warm and dry. No rash noted. She is not diaphoretic.  Psychiatric: She has a normal mood and affect. Judgment normal.    ED Course  Procedures (including critical care time) Labs Review Labs Reviewed - No data to display  Imaging Review Dg Chest 2 View  05/10/2014   CLINICAL DATA:  Cough  EXAM: CHEST  2 VIEW  COMPARISON:  03/02/2013  FINDINGS: The heart size and mediastinal contours are within normal limits. Both lungs are clear. The visualized skeletal structures are unremarkable.  IMPRESSION: No active cardiopulmonary disease.   Electronically Signed   By: Alcide Clever M.D.   On: 05/10/2014 13:40   Dg Lumbar Spine Complete  05/10/2014   CLINICAL DATA:  Lower right back pain  EXAM: LUMBAR SPINE - COMPLETE 4+ VIEW  COMPARISON:  None.  FINDINGS: There is no evidence of lumbar spine  fracture. Alignment is normal. Intervertebral disc spaces are maintained.  IMPRESSION: No acute abnormality noted.   Electronically Signed   By: Alcide CleverMark  Lukens M.D.   On: 05/10/2014 13:49     MDM   1. Viral URI with cough   2. Other acute sinusitis   3. Other back pain    X-ray is normal. Treating symptomatically for viral upper respiratory infection and mechanical low back pain. Followup if no improvement.   Meds ordered this encounter  Medications  . chlorpheniramine-HYDROcodone (TUSSIONEX PENNKINETIC ER) 10-8 MG/5ML LQCR    Sig: Take 5 mLs by mouth every 12 (twelve) hours as needed for cough (or for pain).    Dispense:  115 mL    Refill:  0    Order Specific Question:  Supervising Provider    Answer:  Clementeen GrahamOREY, EVAN, S [id number]]  . methylPREDNISolone (MEDROL DOSEPAK) 4 MG tablet    Sig: Use as directed on package instructions    Dispense:  21 tablet    Refill:  0    Order Specific  Question:  Supervising Provider    Answer:  Clementeen GrahamOREY, EVAN, S [[id number]]  . fluticasone (FLONASE) 50 MCG/ACT nasal spray    Sig: Place 2 sprays into both nostrils 2 (two) times daily. Decrease to 2 sprays/nostril daily after 5 days    Dispense:  16 g    Refill:  2    Order Specific Question:  Supervising Provider    Answer:  Rodolph BongCOREY, EVAN, S [[id number]]      Graylon GoodZachary H Haward Pope, PA-C 05/10/14 1442   Patient called back to say that she was feeling worse and had just had a very large bloody bowel movement. She wants to know if she needs to go to the hospital. I've advised her to go to the emergency department, she says she will go there now.  Graylon GoodZachary H Fountain Derusha, PA-C 05/10/14 [phone]

## 2014-05-10 NOTE — ED Provider Notes (Signed)
Medical screening examination/treatment/procedure(s) were performed by resident physician or non-physician practitioner and as supervising physician I was immediately available for consultation/collaboration.  Erin Honig, MD     Erin J Honig, MD 05/10/14 1849 

## 2014-05-10 NOTE — Discharge Instructions (Signed)
Back Pain, Adult Back pain is very common. The pain often gets better over time. The cause of back pain is usually not dangerous. Most people can learn to manage their back pain on their own.  HOME CARE   Stay active. Start with short walks on flat ground if you can. Try to walk farther each day.  Do not sit, drive, or stand in one place for more than 30 minutes. Do not stay in bed.  Do not avoid exercise or work. Activity can help your back heal faster.  Be careful when you bend or lift an object. Bend at your knees, keep the object close to you, and do not twist.  Sleep on a firm mattress. Lie on your side, and bend your knees. If you lie on your back, put a pillow under your knees.  Only take medicines as told by your doctor.  Put ice on the injured area.  Put ice in a plastic bag.  Place a towel between your skin and the bag.  Leave the ice on for 15-20 minutes, 03-04 times a day for the first 2 to 3 days. After that, you can switch between ice and heat packs.  Ask your doctor about back exercises or massage.  Avoid feeling anxious or stressed. Find good ways to deal with stress, such as exercise. GET HELP RIGHT AWAY IF:   Your pain does not go away with rest or medicine.  Your pain does not go away in 1 week.  You have new problems.  You do not feel well.  The pain spreads into your legs.  You cannot control when you poop (bowel movement) or pee (urinate).  Your arms or legs feel weak or lose feeling (numbness).  You feel sick to your stomach (nauseous) or throw up (vomit).  You have belly (abdominal) pain.  You feel like you may pass out (faint). MAKE SURE YOU:   Understand these instructions.  Will watch your condition.  Will get help right away if you are not doing well or get worse. Document Released: 03/11/2008 Document Revised: 12/16/2011 Document Reviewed: 01/25/2014 [facility] Patient Information 2015 [place], [place]. This information is not intended  to replace advice given to you by your health care provider. Make sure you discuss any questions you have with your health care provider.  Sinusitis Sinusitis is redness, soreness, and inflammation of the paranasal sinuses. Paranasal sinuses are air pockets within the bones of your face (beneath the eyes, the middle of the forehead, or above the eyes). In healthy paranasal sinuses, mucus is able to drain out, and air is able to circulate through them by way of your nose. However, when your paranasal sinuses are inflamed, mucus and air can become trapped. This can allow bacteria and other germs to grow and cause infection. Sinusitis can develop quickly and last only a short time (acute) or continue over a long period (chronic). Sinusitis that lasts for more than 12 weeks is considered chronic.  CAUSES  Causes of sinusitis include:  Allergies.  Structural abnormalities, such as displacement of the cartilage that separates your nostrils (deviated septum), which can decrease the air flow through your nose and sinuses and affect sinus drainage.  Functional abnormalities, such as when the small hairs (cilia) that line your sinuses and help remove mucus do not work properly or are not present. SIGNS AND SYMPTOMS  Symptoms of acute and chronic sinusitis are the same. The primary symptoms are pain and pressure around the affected sinuses. Other  symptoms include:  Upper toothache.  Earache.  Headache.  Bad breath.  Decreased sense of smell and taste.  A cough, which worsens when you are lying flat.  Fatigue.  Fever.  Thick drainage from your nose, which often is green and may contain pus (purulent).  Swelling and warmth over the affected sinuses. DIAGNOSIS  Your health care provider will perform a physical exam. During the exam, your health care provider may:  Look in your nose for signs of abnormal growths in your nostrils (nasal polyps).  Tap over the affected sinus to check for signs  of infection.  View the inside of your sinuses (endoscopy) using an imaging device that has a light attached (endoscope). If your health care provider suspects that you have chronic sinusitis, one or more of the following tests may be recommended:  Allergy tests.  Nasal culture. A sample of mucus is taken from your nose, sent to a lab, and screened for bacteria.  Nasal cytology. A sample of mucus is taken from your nose and examined by your health care provider to determine if your sinusitis is related to an allergy. TREATMENT  Most cases of acute sinusitis are related to a viral infection and will resolve on their own within 10 days. Sometimes medicines are prescribed to help relieve symptoms (pain medicine, decongestants, nasal steroid sprays, or saline sprays).  However, for sinusitis related to a bacterial infection, your health care provider will prescribe antibiotic medicines. These are medicines that will help kill the bacteria causing the infection.  Rarely, sinusitis is caused by a fungal infection. In theses cases, your health care provider will prescribe antifungal medicine. For some cases of chronic sinusitis, surgery is needed. Generally, these are cases in which sinusitis recurs more than 3 times per year, despite other treatments. HOME CARE INSTRUCTIONS   Drink plenty of water. Water helps thin the mucus so your sinuses can drain more easily.  Use a humidifier.  Inhale steam 3 to 4 times a day (for example, sit in the bathroom with the shower running).  Apply a warm, moist washcloth to your face 3 to 4 times a day, or as directed by your health care provider.  Use saline nasal sprays to help moisten and clean your sinuses.  Take medicines only as directed by your health care provider.  If you were prescribed either an antibiotic or antifungal medicine, finish it all even if you start to feel better. SEEK IMMEDIATE MEDICAL CARE IF:  You have increasing pain or severe  headaches.  You have nausea, vomiting, or drowsiness.  You have swelling around your face.  You have vision problems.  You have a stiff neck.  You have difficulty breathing. MAKE SURE YOU:   Understand these instructions.  Will watch your condition.  Will get help right away if you are not doing well or get worse. Document Released: 09/23/2005 Document Revised: 02/07/2014 Document Reviewed: 10/08/2011 [facility] Patient Information 2015 [place], [place]. This information is not intended to replace advice given to you by your health care provider. Make sure you discuss any questions you have with your health care provider.  Upper Respiratory Infection, Adult An upper respiratory infection (URI) is also sometimes known as the common cold. The upper respiratory tract includes the nose, sinuses, throat, trachea, and bronchi. Bronchi are the airways leading to the lungs. Most people improve within 1 week, but symptoms can last up to 2 weeks. A residual cough may last even longer.  CAUSES Many different viruses can  infect the tissues lining the upper respiratory tract. The tissues become irritated and inflamed and often become very moist. Mucus production is also common. A cold is contagious. You can easily spread the virus to others by oral contact. This includes kissing, sharing a glass, coughing, or sneezing. Touching your mouth or nose and then touching a surface, which is then touched by another person, can also spread the virus. SYMPTOMS  Symptoms typically develop 1 to 3 days after you come in contact with a cold virus. Symptoms vary from person to person. They may include:  Runny nose.  Sneezing.  Nasal congestion.  Sinus irritation.  Sore throat.  Loss of voice (laryngitis).  Cough.  Fatigue.  Muscle aches.  Loss of appetite.  Headache.  Low-grade fever. DIAGNOSIS  You might diagnose your own cold based on familiar symptoms, since most people get a cold 2 to 3  times a year. Your caregiver can confirm this based on your exam. Most importantly, your caregiver can check that your symptoms are not due to another disease such as strep throat, sinusitis, pneumonia, asthma, or epiglottitis. Blood tests, throat tests, and X-rays are not necessary to diagnose a common cold, but they may sometimes be helpful in excluding other more serious diseases. Your caregiver will decide if any further tests are required. RISKS AND COMPLICATIONS  You may be at risk for a more severe case of the common cold if you smoke cigarettes, have chronic heart disease (such as heart failure) or lung disease (such as asthma), or if you have a weakened immune system. The very young and very old are also at risk for more serious infections. Bacterial sinusitis, middle ear infections, and bacterial pneumonia can complicate the common cold. The common cold can worsen asthma and chronic obstructive pulmonary disease (COPD). Sometimes, these complications can require emergency medical care and may be life-threatening. PREVENTION  The best way to protect against getting a cold is to practice good hygiene. Avoid oral or hand contact with people with cold symptoms. Wash your hands often if contact occurs. There is no clear evidence that vitamin C, vitamin E, echinacea, or exercise reduces the chance of developing a cold. However, it is always recommended to get plenty of rest and practice good nutrition. TREATMENT  Treatment is directed at relieving symptoms. There is no cure. Antibiotics are not effective, because the infection is caused by a virus, not by bacteria. Treatment may include:  Increased fluid intake. Sports drinks offer valuable electrolytes, sugars, and fluids.  Breathing heated mist or steam (vaporizer or shower).  Eating chicken soup or other clear broths, and maintaining good nutrition.  Getting plenty of rest.  Using gargles or lozenges for comfort.  Controlling fevers with  ibuprofen or acetaminophen as directed by your caregiver.  Increasing usage of your inhaler if you have asthma. Zinc gel and zinc lozenges, taken in the first 24 hours of the common cold, can shorten the duration and lessen the severity of symptoms. Pain medicines may help with fever, muscle aches, and throat pain. A variety of non-prescription medicines are available to treat congestion and runny nose. Your caregiver can make recommendations and may suggest nasal or lung inhalers for other symptoms.  HOME CARE INSTRUCTIONS   Only take over-the-counter or prescription medicines for pain, discomfort, or fever as directed by your caregiver.  Use a warm mist humidifier or inhale steam from a shower to increase air moisture. This may keep secretions moist and make it easier to breathe.  Drink enough water and fluids to keep your urine clear or pale yellow.  Rest as needed.  Return to work when your temperature has returned to normal or as your caregiver advises. You may need to stay home longer to avoid infecting others. You can also use a face mask and careful hand washing to prevent spread of the virus. SEEK MEDICAL CARE IF:   After the first few days, you feel you are getting worse rather than better.  You need your caregiver's advice about medicines to control symptoms.  You develop chills, worsening shortness of breath, or brown or red sputum. These may be signs of pneumonia.  You develop yellow or brown nasal discharge or pain in the face, especially when you bend forward. These may be signs of sinusitis.  You develop a fever, swollen neck glands, pain with swallowing, or white areas in the back of your throat. These may be signs of strep throat. SEEK IMMEDIATE MEDICAL CARE IF:   You have a fever.  You develop severe or persistent headache, ear pain, sinus pain, or chest pain.  You develop wheezing, a prolonged cough, cough up blood, or have a change in your usual mucus (if you have  chronic lung disease).  You develop sore muscles or a stiff neck. Document Released: 03/19/2001 Document Revised: 12/16/2011 Document Reviewed: 12/29/2013 [facility] Patient Information 2015 [place], [place]. This information is not intended to replace advice given to you by your health care provider. Make sure you discuss any questions you have with your health care provider.

## 2014-05-13 ENCOUNTER — Emergency Department (HOSPITAL_COMMUNITY)
Admission: EM | Admit: 2014-05-13 | Discharge: 2014-05-13 | Disposition: A | Discharge status: Home / Self Care | Payer: 59 | Attending: Emergency Medicine | Admitting: Emergency Medicine

## 2014-05-13 ENCOUNTER — Encounter (HOSPITAL_COMMUNITY): Payer: Self-pay | Admitting: Emergency Medicine

## 2014-05-13 DIAGNOSIS — F329 Major depressive disorder, single episode, unspecified: Secondary | ICD-10-CM | POA: Insufficient documentation

## 2014-05-13 DIAGNOSIS — F411 Generalized anxiety disorder: Secondary | ICD-10-CM | POA: Insufficient documentation

## 2014-05-13 DIAGNOSIS — Z3202 Encounter for pregnancy test, result negative: Secondary | ICD-10-CM | POA: Insufficient documentation

## 2014-05-13 DIAGNOSIS — IMO0002 Reserved for concepts with insufficient information to code with codable children: Secondary | ICD-10-CM | POA: Diagnosis not present

## 2014-05-13 DIAGNOSIS — R110 Nausea: Secondary | ICD-10-CM | POA: Diagnosis not present

## 2014-05-13 DIAGNOSIS — K625 Hemorrhage of anus and rectum: Secondary | ICD-10-CM | POA: Insufficient documentation

## 2014-05-13 DIAGNOSIS — F3289 Other specified depressive episodes: Secondary | ICD-10-CM | POA: Diagnosis not present

## 2014-05-13 DIAGNOSIS — Z79899 Other long term (current) drug therapy: Secondary | ICD-10-CM | POA: Insufficient documentation

## 2014-05-13 DIAGNOSIS — Z880 Allergy status to penicillin: Secondary | ICD-10-CM | POA: Insufficient documentation

## 2014-05-13 DIAGNOSIS — Z8619 Personal history of other infectious and parasitic diseases: Secondary | ICD-10-CM | POA: Diagnosis not present

## 2014-05-13 DIAGNOSIS — Z791 Long term (current) use of non-steroidal anti-inflammatories (NSAID): Secondary | ICD-10-CM | POA: Diagnosis not present

## 2014-05-13 DIAGNOSIS — N809 Endometriosis, unspecified: Secondary | ICD-10-CM | POA: Insufficient documentation

## 2014-05-13 DIAGNOSIS — R109 Unspecified abdominal pain: Secondary | ICD-10-CM | POA: Diagnosis not present

## 2014-05-13 DIAGNOSIS — Z87891 Personal history of nicotine dependence: Secondary | ICD-10-CM | POA: Diagnosis not present

## 2014-05-13 DIAGNOSIS — N3000 Acute cystitis without hematuria: Secondary | ICD-10-CM | POA: Diagnosis not present

## 2014-05-13 DIAGNOSIS — N3001 Acute cystitis with hematuria: Secondary | ICD-10-CM

## 2014-05-13 HISTORY — DX: Disorder of kidney and ureter, unspecified: N28.9

## 2014-05-13 LAB — URINALYSIS, ROUTINE W REFLEX MICROSCOPIC
Bilirubin Urine: NEGATIVE
GLUCOSE, UA: NEGATIVE mg/dL
Hgb urine dipstick: NEGATIVE
Ketones, ur: NEGATIVE mg/dL
Nitrite: POSITIVE — AB
Protein, ur: NEGATIVE mg/dL
SPECIFIC GRAVITY, URINE: 1.017 (ref 1.005–1.030)
Urobilinogen, UA: 1 mg/dL (ref 0.0–1.0)
pH: 7.5 (ref 5.0–8.0)

## 2014-05-13 LAB — URINE MICROSCOPIC-ADD ON

## 2014-05-13 LAB — BASIC METABOLIC PANEL
Anion gap: 12 (ref 5–15)
BUN: 10 mg/dL (ref 6–23)
CHLORIDE: 104 meq/L (ref 96–112)
CO2: 26 meq/L (ref 19–32)
Calcium: 9.3 mg/dL (ref 8.4–10.5)
Creatinine, Ser: 0.61 mg/dL (ref 0.50–1.10)
GFR calc Af Amer: >90 mL/min (ref >90)
GFR calc non Af Amer: >90 mL/min (ref >90)
GLUCOSE: 92 mg/dL (ref 70–99)
POTASSIUM: 4.2 meq/L (ref 3.7–5.3)
SODIUM: 142 meq/L (ref 137–147)

## 2014-05-13 LAB — CBC
HEMATOCRIT: 38.4 % (ref 36.0–46.0)
HEMOGLOBIN: 13.6 g/dL (ref 12.0–15.0)
MCH: 32.6 pg (ref 26.0–34.0)
MCHC: 35.4 g/dL (ref 30.0–36.0)
MCV: 92.1 fL (ref 78.0–100.0)
Platelets: 194 10*3/uL (ref 150–400)
RBC: 4.17 MIL/uL (ref 3.87–5.11)
RDW: 12 % (ref 11.5–15.5)
WBC: 5.7 10*3/uL (ref 4.0–10.5)

## 2014-05-13 LAB — POC URINE PREG, ED: Preg Test, Ur: NEGATIVE

## 2014-05-13 MED ORDER — CEPHALEXIN 500 MG PO CAPS: 500.0000 mg | ORAL_CAPSULE | Freq: Three times a day (TID) | ORAL | Status: DC

## 2014-05-13 MED ORDER — PHENAZOPYRIDINE HCL 100 MG PO TABS
200.0000 mg | ORAL_TABLET | Freq: Once | ORAL | Status: AC
  Administered 2014-05-13: 200 mg via ORAL
  Filled 2014-05-13: qty 2

## 2014-05-13 MED ORDER — MORPHINE SULFATE 4 MG/ML IJ SOLN
4.0000 mg | INTRAMUSCULAR | Status: AC | PRN
  Administered 2014-05-13 (×3): 4 mg via INTRAVENOUS
  Filled 2014-05-13 (×3): qty 1

## 2014-05-13 MED ORDER — PHENAZOPYRIDINE HCL 200 MG PO TABS: 200.0000 mg | ORAL_TABLET | Freq: Three times a day (TID) | ORAL | Status: DC

## 2014-05-13 MED ORDER — OXYCODONE-ACETAMINOPHEN 5-325 MG PO TABS: 1.0000 | ORAL_TABLET | Freq: Once | ORAL | Status: DC

## 2014-05-13 MED ORDER — OXYCODONE-ACETAMINOPHEN 5-325 MG PO TABS: 1.0000 | ORAL_TABLET | ORAL | Status: DC | PRN

## 2014-05-13 MED ORDER — DEXTROSE 5 % IV SOLN
1.0000 g | Freq: Once | INTRAVENOUS | Status: AC
  Administered 2014-05-13: 1 g via INTRAVENOUS
  Filled 2014-05-13: qty 10

## 2014-05-13 NOTE — ED Provider Notes (Signed)
CSN: [id number]     Arrival date & time 05/13/14  1712 History   First MD Initiated Contact with Patient 05/13/14 1821     Chief Complaint  Patient presents with  . Hematuria  . Rectal Bleeding   HPI  Jenna Blake is a 23 year old female with a history of chronic pelvic pain, endometriosis, and status post cholecystectomy who presents with lower abdominal pain, hematuria, and hematochezia.  Patient states that the symptoms started yesterday. Upon urination, she has noticed blood in her urine, dysuria, and lower abdominal pain. She notes a history of nephrolithiasis. Additionally, while having a bowel movement, she has noticed bright red blood coming out to the toilet. She denies any recent vaginal bleeding. Patient is pleased that she could be pregnant as she is on Lupron.   She does report a history of chronic pelvic pain, similar in location and quality to her current symptoms. They're mostly limited to her lower abdomen bilaterally, and sometimes wraparound to her flanks. She states that it is a sharp 8/10 pain. She denies any vomiting, but does endorse occasional nausea.   Past Medical History  Diagnosis Date  . Endometriosis   . Depression     Diagnosed at age 18  . Anxiety   . Ovarian cyst   . Mononucleosis   . Renal disorder    Past Surgical History  Procedure Laterality Date  . Cholecystectomy    . Tonsillectomy    . Laparoscopic endometriosis fulguration    . Laparoscopy N/A 01/04/2014    Procedure: LAPAROSCOPY DIAGNOSTIC;  Surgeon: Adam Phenix, MD;  Location: [facility];  Service: Gynecology;  Laterality: N/A;   Family History  Problem Relation Age of Onset  . Hypertension Father   . Diabetes Maternal Grandmother    History  Substance Use Topics  . Smoking status: Former Smoker -- 0.50 packs/day    Types: Cigarettes  . Smokeless tobacco: Never Used  . Alcohol Use: No     Comment: Former user   OB History   Grav Para Term Preterm Abortions TAB SAB Ect Mult Living    0 0 0 0 0 0 0 0 0 0      Review of Systems  Constitutional: Negative for fever and chills.  Respiratory: Negative for shortness of breath.   Cardiovascular: Negative for chest pain.  Gastrointestinal: Positive for nausea, abdominal pain and blood in stool. Negative for vomiting, diarrhea, constipation and abdominal distention.  Genitourinary: Positive for dysuria and hematuria.      Allergies  Ativan; Betadine; Reglan; and Amoxicillin  Home Medications   Prior to Admission medications   Medication Sig Start Date End Date Taking? Authorizing Provider  ALPRAZolam Prudy Feeler) 0.25 MG tablet TAKE 1 TABLET BY MOUTH THREE TIMES DAILY AS NEEDED FOR ANXIETY 03/28/14   Lelon Perla, DO  celecoxib (CELEBREX) 100 MG capsule Take 1-2 capsules (100-200 mg total) by mouth 2 (two) times daily. 12/22/13 12/22/14  Dorathy Kinsman, CNM  chlorpheniramine-HYDROcodone (TUSSIONEX PENNKINETIC ER) 10-8 MG/5ML LQCR Take 5 mLs by mouth every 12 (twelve) hours as needed for cough (or for pain). 05/10/14   Adrian Blackwater Baker, PA-C  fluticasone (FLONASE) 50 MCG/ACT nasal spray Place 2 sprays into both nostrils 2 (two) times daily. Decrease to 2 sprays/nostril daily after 5 days 05/10/14   Graylon Good, PA-C  gabapentin (NEURONTIN) 600 MG tablet Take 1 tablet (600 mg total) by mouth at bedtime. 11/15/13   Adam Phenix, MD  Leuprolide Acetate (LUPRON DEPOT IM) Inject into  the muscle.    Historical Provider, MD  medroxyPROGESTERone (PROVERA) 10 MG tablet Take 2 tablets (20 mg total) by mouth daily. 03/30/14   Adam PhenixJames G Arnold, MD  methylPREDNISolone (MEDROL DOSEPAK) 4 MG tablet Use as directed on package instructions 05/10/14   Graylon GoodZachary H Baker, PA-C  ondansetron (ZOFRAN-ODT) 4 MG disintegrating tablet Take 4 mg by mouth every 8 (eight) hours as needed for nausea or vomiting.    Historical Provider, MD  promethazine (PHENERGAN) 25 MG tablet Take 1 tablet (25 mg total) by mouth every 6 (six) hours as needed for nausea or vomiting.  02/15/14   Jean RosenthalSusan P Lineberry, NP  traMADol (ULTRAM) 50 MG tablet TAKE 1-2 TABLET EVERY 6 HOURS AS NEEDED MODERATE TO SEVERE PAIN 04/26/14   Adam PhenixJames G Arnold, MD  traMADol-acetaminophen (ULTRACET) 37.5-325 MG per tablet Take 1-2 tablets by mouth every 6 (six) hours as needed. 02/23/14   Adam PhenixJames G Arnold, MD  Vortioxetine HBr (BRINTELLIX) 20 MG TABS 1 po qd 01/27/14   Grayling CongressYvonne R Lowne, DO   BP 129/79  Pulse 92  Temp(Src) 97.8 F (36.6 C) (Oral)  Resp 19  Wt 101 lb (45.813 kg)  SpO2 100%  LMP 05/13/2014 Physical Exam  Constitutional: She is oriented to person, place, and time. She appears well-developed and well-nourished. No distress.  HENT:  Head: Normocephalic and atraumatic.  Eyes: EOM are normal. Pupils are equal, round, and reactive to light.  Cardiovascular: Normal rate and regular rhythm.  Exam reveals no gallop and no friction rub.   No murmur heard. Pulmonary/Chest: Effort normal and breath sounds normal. She has no wheezes. She has no rales.  Abdominal: Soft. She exhibits no mass. There is no tenderness. There is no rebound and no guarding.  Musculoskeletal: She exhibits no edema.  Neurological: She is alert and oriented to person, place, and time. No cranial nerve deficit.  Skin: She is not diaphoretic.  Rectal: No signs of external hemorrhoids, no stool or blood in the rectal vault.  ED Course  Procedures (including critical care time) Labs Review Labs Reviewed  URINALYSIS, ROUTINE W REFLEX MICROSCOPIC  CBC  BASIC METABOLIC PANEL  POC URINE PREG, ED    Imaging Review No results found.   EKG Interpretation None      MDM   Final diagnoses:  None    Patient presenting with lower abdominal pain, hematuria, and hematochezia that started one day ago. Her abdominal pain could be consistent with her chronic pelvic pain, for which she is managed as an outpatient GYN and has a pain contract. Combination of hematuria and hematochezia likely to be secondary to vaginal blood,  however, will rule out urinary tract infection or gross hematuria. Vital signs are within normal limits. Exam does not show any signs of active lower GI bleeding.  - Morphine IV for pain management.  8:24 PM  - Patient went to the bathroom and now complaining of recurrent 8/10 lower abdominal pain. Another 4 mg morphine IV administered.  - UA still pending.  - Patient signed out to Elsie StainGail Shultz for further pain management and follow up UA.    Harold BarbanLawrence Shallen Luedke, MD 05/13/14 2026

## 2014-05-13 NOTE — ED Notes (Signed)
Presents with lower abdominal pain with radiation to back pain is on both sides associated with hematuria and bloody diarrhea yesterday. Denies vaginal discharge. Pain began this AM.

## 2014-05-13 NOTE — Discharge Instructions (Signed)

## 2014-05-13 NOTE — ED Notes (Signed)
Pt was unable to urinate.  

## 2014-05-13 NOTE — ED Notes (Signed)
Chaperoned rectal exam with Dr. Loma NewtonNgo, Pt tolerated without issue. Pt given glass of water per Dr. Loma NewtonNgo.

## 2014-05-13 NOTE — ED Provider Notes (Signed)
I saw and evaluated the patient, reviewed the resident's note and I agree with the findings and plan.   EKG Interpretation None      Results for orders placed during the hospital encounter of 05/13/14  URINALYSIS, ROUTINE W REFLEX MICROSCOPIC      Result Value Ref Range   Color, Urine YELLOW  YELLOW   APPearance TURBID (*) CLEAR   Specific Gravity, Urine 1.017  1.005 - 1.030   pH 7.5  5.0 - 8.0   Glucose, UA NEGATIVE  NEGATIVE mg/dL   Hgb urine dipstick NEGATIVE  NEGATIVE   Bilirubin Urine NEGATIVE  NEGATIVE   Ketones, ur NEGATIVE  NEGATIVE mg/dL   Protein, ur NEGATIVE  NEGATIVE mg/dL   Urobilinogen, UA 1.0  0.0 - 1.0 mg/dL   Nitrite POSITIVE (*) NEGATIVE   Leukocytes, UA SMALL (*) NEGATIVE  CBC      Result Value Ref Range   WBC 5.7  4.0 - 10.5 K/uL   RBC 4.17  3.87 - 5.11 MIL/uL   Hemoglobin 13.6  12.0 - 15.0 g/dL   HCT 09.838.4  11.936.0 - 14.746.0 %   MCV 92.1  78.0 - 100.0 fL   MCH 32.6  26.0 - 34.0 pg   MCHC 35.4  30.0 - 36.0 g/dL   RDW 82.912.0  56.211.5 - 13.015.5 %   Platelets 194  150 - 400 K/uL  BASIC METABOLIC PANEL      Result Value Ref Range   Sodium 142  137 - 147 mEq/L   Potassium 4.2  3.7 - 5.3 mEq/L   Chloride 104  96 - 112 mEq/L   CO2 26  19 - 32 mEq/L   Glucose, Bld 92  70 - 99 mg/dL   BUN 10  6 - 23 mg/dL   Creatinine, Ser 8.650.61  0.50 - 1.10 mg/dL   Calcium 9.3  8.4 - 78.410.5 mg/dL   GFR calc non Af Amer >90  >90 mL/min   GFR calc Af Amer >90  >90 mL/min   Anion gap 12  5 - 15  URINE MICROSCOPIC-ADD ON      Result Value Ref Range   Squamous Epithelial / LPF MANY (*) RARE   WBC, UA 11-20  <3 WBC/hpf   Bacteria, UA MANY (*) RARE   Urine-Other AMORPHOUS URATES/PHOSPHATES    POC URINE PREG, ED      Result Value Ref Range   Preg Test, Ur NEGATIVE  NEGATIVE   UTI.  We'll treat with Rocephin.  Pyridium.  Home with antibiotics and pain medicine.  Lyanne CoKevin M Pau Banh, MD 05/13/14 2045

## 2014-05-16 ENCOUNTER — Emergency Department (HOSPITAL_COMMUNITY)
Admission: EM | Admit: 2014-05-16 | Discharge: 2014-05-17 | Disposition: A | Discharge status: Home / Self Care | Payer: 59 | Attending: Emergency Medicine | Admitting: Emergency Medicine

## 2014-05-16 ENCOUNTER — Encounter (HOSPITAL_COMMUNITY): Payer: Self-pay | Admitting: Emergency Medicine

## 2014-05-16 ENCOUNTER — Emergency Department (HOSPITAL_COMMUNITY): Payer: 59

## 2014-05-16 DIAGNOSIS — A499 Bacterial infection, unspecified: Secondary | ICD-10-CM | POA: Insufficient documentation

## 2014-05-16 DIAGNOSIS — N83209 Unspecified ovarian cyst, unspecified side: Secondary | ICD-10-CM | POA: Insufficient documentation

## 2014-05-16 DIAGNOSIS — Z880 Allergy status to penicillin: Secondary | ICD-10-CM | POA: Diagnosis not present

## 2014-05-16 DIAGNOSIS — Z87891 Personal history of nicotine dependence: Secondary | ICD-10-CM | POA: Diagnosis not present

## 2014-05-16 DIAGNOSIS — R1110 Vomiting, unspecified: Secondary | ICD-10-CM | POA: Insufficient documentation

## 2014-05-16 DIAGNOSIS — R1033 Periumbilical pain: Secondary | ICD-10-CM | POA: Diagnosis present

## 2014-05-16 DIAGNOSIS — N760 Acute vaginitis: Secondary | ICD-10-CM | POA: Diagnosis not present

## 2014-05-16 DIAGNOSIS — Z79899 Other long term (current) drug therapy: Secondary | ICD-10-CM | POA: Diagnosis not present

## 2014-05-16 DIAGNOSIS — N72 Inflammatory disease of cervix uteri: Secondary | ICD-10-CM | POA: Diagnosis not present

## 2014-05-16 DIAGNOSIS — Z8659 Personal history of other mental and behavioral disorders: Secondary | ICD-10-CM | POA: Insufficient documentation

## 2014-05-16 DIAGNOSIS — Z3202 Encounter for pregnancy test, result negative: Secondary | ICD-10-CM | POA: Diagnosis not present

## 2014-05-16 DIAGNOSIS — B9689 Other specified bacterial agents as the cause of diseases classified elsewhere: Secondary | ICD-10-CM | POA: Diagnosis not present

## 2014-05-16 DIAGNOSIS — N200 Calculus of kidney: Secondary | ICD-10-CM

## 2014-05-16 DIAGNOSIS — R319 Hematuria, unspecified: Secondary | ICD-10-CM | POA: Diagnosis not present

## 2014-05-16 DIAGNOSIS — Z792 Long term (current) use of antibiotics: Secondary | ICD-10-CM | POA: Insufficient documentation

## 2014-05-16 DIAGNOSIS — R300 Dysuria: Secondary | ICD-10-CM | POA: Insufficient documentation

## 2014-05-16 DIAGNOSIS — Z9089 Acquired absence of other organs: Secondary | ICD-10-CM | POA: Insufficient documentation

## 2014-05-16 DIAGNOSIS — R197 Diarrhea, unspecified: Secondary | ICD-10-CM | POA: Insufficient documentation

## 2014-05-16 LAB — COMPREHENSIVE METABOLIC PANEL
ALK PHOS: 81 U/L (ref 39–117)
ALT: 9 U/L (ref 0–35)
AST: 20 U/L (ref 0–37)
Albumin: 4.4 g/dL (ref 3.5–5.2)
Anion gap: 14 (ref 5–15)
BILIRUBIN TOTAL: 0.4 mg/dL (ref 0.3–1.2)
BUN: 10 mg/dL (ref 6–23)
CO2: 24 meq/L (ref 19–32)
Calcium: 9.1 mg/dL (ref 8.4–10.5)
Chloride: 104 mEq/L (ref 96–112)
Creatinine, Ser: 0.58 mg/dL (ref 0.50–1.10)
GFR calc Af Amer: >90 mL/min (ref >90)
GLUCOSE: 97 mg/dL (ref 70–99)
POTASSIUM: 3.9 meq/L (ref 3.7–5.3)
SODIUM: 142 meq/L (ref 137–147)
Total Protein: 7.3 g/dL (ref 6.0–8.3)

## 2014-05-16 LAB — CBC WITH DIFFERENTIAL/PLATELET
Basophils Absolute: 0 10*3/uL (ref 0.0–0.1)
Basophils Relative: 0 % (ref 0–1)
EOS PCT: 1 % (ref 0–5)
Eosinophils Absolute: 0.1 10*3/uL (ref 0.0–0.7)
HCT: 39 % (ref 36.0–46.0)
Hemoglobin: 13.4 g/dL (ref 12.0–15.0)
LYMPHS ABS: 2.3 10*3/uL (ref 0.7–4.0)
Lymphocytes Relative: 45 % (ref 12–46)
MCH: 31.6 pg (ref 26.0–34.0)
MCHC: 34.4 g/dL (ref 30.0–36.0)
MCV: 92 fL (ref 78.0–100.0)
Monocytes Absolute: 0.5 10*3/uL (ref 0.1–1.0)
Monocytes Relative: 9 % (ref 3–12)
NEUTROS PCT: 45 % (ref 43–77)
Neutro Abs: 2.4 10*3/uL (ref 1.7–7.7)
Platelets: 222 10*3/uL (ref 150–400)
RBC: 4.24 MIL/uL (ref 3.87–5.11)
RDW: 12.2 % (ref 11.5–15.5)
WBC: 5.2 10*3/uL (ref 4.0–10.5)

## 2014-05-16 LAB — URINE CULTURE

## 2014-05-16 LAB — URINALYSIS, ROUTINE W REFLEX MICROSCOPIC
BILIRUBIN URINE: NEGATIVE
GLUCOSE, UA: NEGATIVE mg/dL
KETONES UR: NEGATIVE mg/dL
Leukocytes, UA: NEGATIVE
Nitrite: NEGATIVE
Protein, ur: NEGATIVE mg/dL
Specific Gravity, Urine: 1.024 (ref 1.005–1.030)
UROBILINOGEN UA: 0.2 mg/dL (ref 0.0–1.0)
pH: 7 (ref 5.0–8.0)

## 2014-05-16 LAB — WET PREP, GENITAL
Trich, Wet Prep: NONE SEEN
WBC, Wet Prep HPF POC: NONE SEEN
Yeast Wet Prep HPF POC: NONE SEEN

## 2014-05-16 LAB — URINE MICROSCOPIC-ADD ON

## 2014-05-16 LAB — POC URINE PREG, ED: Preg Test, Ur: NEGATIVE

## 2014-05-16 LAB — POC OCCULT BLOOD, ED: FECAL OCCULT BLD: NEGATIVE

## 2014-05-16 MED ORDER — CEFTRIAXONE SODIUM 250 MG IJ SOLR
250.0000 mg | Freq: Once | INTRAMUSCULAR | Status: AC
Start: 2014-05-16 — End: 2014-05-16
  Administered 2014-05-16: 250 mg via INTRAMUSCULAR
  Filled 2014-05-16: qty 250

## 2014-05-16 MED ORDER — ONDANSETRON 4 MG PO TBDP
8.0000 mg | ORAL_TABLET | Freq: Once | ORAL | Status: AC
  Administered 2014-05-16: 8 mg via ORAL
  Filled 2014-05-16: qty 2

## 2014-05-16 MED ORDER — METRONIDAZOLE 500 MG PO TABS: 500.0000 mg | ORAL_TABLET | Freq: Two times a day (BID) | ORAL | Status: DC

## 2014-05-16 MED ORDER — SODIUM CHLORIDE 0.9 % IV BOLUS (SEPSIS)
1000.0000 mL | Freq: Once | INTRAVENOUS | Status: AC
  Administered 2014-05-16: 1000 mL via INTRAVENOUS

## 2014-05-16 MED ORDER — IOHEXOL 300 MG/ML  SOLN
25.0000 mL | INTRAMUSCULAR | Status: AC
  Administered 2014-05-16: 25 mL via ORAL

## 2014-05-16 MED ORDER — IOHEXOL 300 MG/ML  SOLN
80.0000 mL | Freq: Once | INTRAMUSCULAR | Status: AC | PRN
  Administered 2014-05-16: 80 mL via INTRAVENOUS

## 2014-05-16 MED ORDER — HYDROMORPHONE HCL PF 1 MG/ML IJ SOLN
1.0000 mg | Freq: Once | INTRAMUSCULAR | Status: AC
  Administered 2014-05-16: 1 mg via INTRAVENOUS
  Filled 2014-05-16: qty 1

## 2014-05-16 MED ORDER — OXYCODONE-ACETAMINOPHEN 5-325 MG PO TABS
1.0000 | ORAL_TABLET | Freq: Once | ORAL | Status: AC
  Administered 2014-05-16: 1 via ORAL
  Filled 2014-05-16: qty 1

## 2014-05-16 MED ORDER — ONDANSETRON HCL 4 MG PO TABS: 4.0000 mg | ORAL_TABLET | Freq: Four times a day (QID) | ORAL | Status: DC

## 2014-05-16 MED ORDER — LIDOCAINE HCL (PF) 1 % IJ SOLN
5.0000 mL | Freq: Once | INTRAMUSCULAR | Status: AC
  Administered 2014-05-16: 0.9 mL via INTRADERMAL
  Filled 2014-05-16: qty 5

## 2014-05-16 MED ORDER — LORAZEPAM 2 MG/ML IJ SOLN
1.0000 mg | Freq: Once | INTRAMUSCULAR | Status: AC
  Administered 2014-05-16: 1 mg via INTRAVENOUS
  Filled 2014-05-16: qty 1

## 2014-05-16 MED ORDER — OXYCODONE-ACETAMINOPHEN 5-325 MG PO TABS: 1.0000 | ORAL_TABLET | ORAL | Status: DC | PRN

## 2014-05-16 MED ORDER — AZITHROMYCIN 250 MG PO TABS
1000.0000 mg | ORAL_TABLET | Freq: Once | ORAL | Status: AC
Start: 2014-05-16 — End: 2014-05-16
  Administered 2014-05-16: 1000 mg via ORAL
  Filled 2014-05-16: qty 4

## 2014-05-16 NOTE — ED Notes (Signed)
Waynetta SandyBeth, NP at bedside.

## 2014-05-16 NOTE — ED Notes (Signed)
Pt finished drinking oral CT contrast. CT made aware. 

## 2014-05-16 NOTE — ED Notes (Signed)
Pt A&OX4, Pt tearful upon being transferred to US. PA made aware. Meds to be ordered.

## 2014-05-16 NOTE — ED Notes (Signed)
Pt crying stating "i wish my dad was here"

## 2014-05-16 NOTE — ED Notes (Addendum)
Pt reports generalized abdominal pain, hematuria, blood in diarrhea for 3 days. Was seen here dx with UTI. Has been vomiting today x 2. Pt is a x 4.

## 2014-05-16 NOTE — ED Notes (Signed)
Pt stating she is having a panic attack. Pt crying/hyerventilating. MD aware and to order ativan.

## 2014-05-16 NOTE — Discharge Instructions (Signed)
Please follow the instructions listed below.  Be sure to follow-up with your primary care provider to further manage this abdominal pain.  You may need to follow-up with a GI doctor if the bloody diarrhea continues.  Abdominal (belly) pain can be caused by many things. Your caregiver performed an examination and possibly ordered blood/urine tests and imaging (CT scan, x-rays, ultrasound). Many cases can be observed and treated at home after initial evaluation in the emergency department. Even though you are being discharged home, abdominal pain can be unpredictable. Therefore, you need a repeated exam if your pain does not resolve, returns, or worsens. Most patients with abdominal pain don't have to be admitted to the hospital or have surgery, but serious problems like appendicitis and gallbladder attacks can start out as nonspecific pain. Many abdominal conditions cannot be diagnosed in one visit, so follow-up evaluations are very important. SEEK IMMEDIATE MEDICAL ATTENTION IF: The pain does not go away or becomes severe.  A temperature above 101 develops.  Repeated vomiting occurs (multiple episodes).  The pain becomes localized to portions of the abdomen. The right side could possibly be appendicitis. In an adult, the left lower portion of the abdomen could be colitis or diverticulitis.  Blood is being passed in stools or vomit (bright red or black tarry stools).  Return also if you develop chest pain, difficulty breathing, dizziness or fainting, or become confused, poorly responsive.

## 2014-05-16 NOTE — ED Provider Notes (Signed)
CSN: [id number]     Arrival date & time 05/16/14  1617 History   First MD Initiated Contact with Patient 05/16/14 1822     Chief Complaint  Patient presents with  . Abdominal Pain   HPI  Pt is a 23 yo female, presents with generalized abdominal pain, dysuria, hematuria and hematachezia onset Sunday night, worse today.  She reports pain as sharp, 10/10 pain all over but the worst in lower abd.  She reports pain with urination and 2 episodes today of bloody diarrhea. She reports one episode of vomiting today, but notes it was during the abdominal cramping when she was having a loose bowel movement, otherwise she denies nausea, fevers, chills, light-headedness, dizziness, vaginal discharge, or pain with intercourse.      Past Medical History  Diagnosis Date  . Endometriosis   . Depression     Diagnosed at age 20  . Anxiety   . Ovarian cyst   . Mononucleosis   . Renal disorder    Past Surgical History  Procedure Laterality Date  . Cholecystectomy    . Tonsillectomy    . Laparoscopic endometriosis fulguration    . Laparoscopy N/A 01/04/2014    Procedure: LAPAROSCOPY DIAGNOSTIC;  Surgeon: Adam Phenix, MD;  Location: [facility];  Service: Gynecology;  Laterality: N/A;   Family History  Problem Relation Age of Onset  . Hypertension Father   . Diabetes Maternal Grandmother    History  Substance Use Topics  . Smoking status: Former Smoker -- 0.50 packs/day    Types: Cigarettes  . Smokeless tobacco: Never Used  . Alcohol Use: No     Comment: Former user   OB History   Grav Para Term Preterm Abortions TAB SAB Ect Mult Living   0 0 0 0 0 0 0 0 0 0      Review of Systems  Constitutional: Negative for fever, chills and fatigue.  HENT: Negative for sore throat.   Eyes: Negative for visual disturbance.  Respiratory: Negative for cough and shortness of breath.   Cardiovascular: Negative for chest pain and leg swelling.  Gastrointestinal: Positive for vomiting, abdominal pain, diarrhea  and blood in stool. Negative for nausea.  Genitourinary: Positive for dysuria. Negative for vaginal discharge and dyspareunia.  Musculoskeletal: Negative for myalgias.  Skin: Negative for rash.  Neurological: Negative for weakness, numbness and headaches.    Allergies  Betadine; Reglan; and Amoxicillin  Home Medications   Prior to Admission medications   Medication Sig Start Date End Date Taking? Authorizing Provider  cephALEXin (KEFLEX) 500 MG capsule Take 1 capsule (500 mg total) by mouth 3 (three) times daily. 05/13/14  Yes Lyanne Co, MD  Leuprolide Acetate (LUPRON IJ) Inject 1 Applicatorful as directed every 6 (six) months.   Yes Historical Provider, MD  oxyCODONE-acetaminophen (PERCOCET/ROXICET) 5-325 MG per tablet Take 1 tablet by mouth every 4 (four) hours as needed for severe pain. 05/13/14  Yes Lyanne Co, MD  phenazopyridine (PYRIDIUM) 200 MG tablet Take 1 tablet (200 mg total) by mouth 3 (three) times daily. 05/13/14  Yes Lyanne Co, MD   BP 113/70  Pulse 76  Temp(Src) 98.1 F (36.7 C) (Oral)  Resp 20  SpO2 100%  LMP 05/13/2014 Physical Exam  Nursing note and vitals reviewed. Constitutional: She is oriented to person, place, and time. Vital signs are normal. She appears well-developed and well-nourished. No distress.  HENT:  Head: Normocephalic and atraumatic.  Mouth/Throat: Oropharynx is clear and moist. No oropharyngeal exudate.  Eyes: Conjunctivae are normal. Right eye exhibits no discharge. Left eye exhibits no discharge. No scleral icterus.  Neck: Neck supple. No thyromegaly present.  Cardiovascular: Normal rate, regular rhythm and intact distal pulses.   Pulmonary/Chest: Effort normal and breath sounds normal. No respiratory distress. She has no wheezes. She has no rales. She exhibits no tenderness.  Abdominal: Soft. Normal appearance and bowel sounds are normal. She exhibits no distension. There is generalized tenderness. There is CVA tenderness. There is  no rebound and no guarding.  CVA tenderness bilaterally  Genitourinary: Rectal exam shows no external hemorrhoid and no tenderness. Guaiac negative stool. Uterus is tender. Uterus is not enlarged. Cervix exhibits motion tenderness. Cervix exhibits no discharge and no friability. Right adnexum displays tenderness. Left adnexum displays tenderness. There is tenderness around the vagina. No vaginal discharge found.  Musculoskeletal: She exhibits no tenderness.  Lymphadenopathy:    She has no cervical adenopathy.  Neurological: She is alert and oriented to person, place, and time.  Skin: Skin is warm, dry and intact. No rash noted. She is not diaphoretic.  Psychiatric: She has a normal mood and affect.  Pt tearful and appears to be in pain.    ED Course  Procedures (including critical care time) Labs Review Labs Reviewed  WET PREP, GENITAL - Abnormal; Notable for the following:    Clue Cells Wet Prep HPF POC MANY (*)    All other components within normal limits  URINALYSIS, ROUTINE W REFLEX MICROSCOPIC - Abnormal; Notable for the following:    APPearance CLOUDY (*)    Hgb urine dipstick LARGE (*)    All other components within normal limits  URINE MICROSCOPIC-ADD ON - Abnormal; Notable for the following:    Squamous Epithelial / LPF MANY (*)    All other components within normal limits  GC/CHLAMYDIA PROBE AMP  CBC WITH DIFFERENTIAL  COMPREHENSIVE METABOLIC PANEL  RPR  HIV ANTIBODY (ROUTINE TESTING)  POC URINE PREG, ED  POC OCCULT BLOOD, ED    Imaging Review [place] Transvaginal Non-ob  05/16/2014   CLINICAL DATA:  Abdominal pain, assess for ovarian torsion.  EXAM: TRANSABDOMINAL AND TRANSVAGINAL ULTRASOUND OF PELVIS  DOPPLER ULTRASOUND OF OVARIES  TECHNIQUE: Both transabdominal and transvaginal ultrasound examinations of the pelvis were performed. Transabdominal technique was performed for global imaging of the pelvis including uterus, ovaries, adnexal regions, and pelvic cul-de-sac.  It  was necessary to proceed with endovaginal exam following the transabdominal exam to visualize the adnexa. Color and duplex Doppler ultrasound was utilized to evaluate blood flow to the ovaries.  COMPARISON:  Pelvic ultrasound August 29, 2013  FINDINGS: Uterus  Measurements: 5.6 x 2.2 x 3.3 cm. No fibroids or other mass visualized.  Endometrium  Thickness: 8 mm.  No focal abnormality visualized.  Right ovary  Measurements: 3.3 x 2.7 x 1.8 cm. Normal appearance/no adnexal mass. Dominant follicle/cyst measuring 14 mm, normal.  Left ovary  Measurements: 2.4 x 1.3 x 0.8 cm. Normal appearance/no adnexal mass.  Pulsed Doppler evaluation of both ovaries demonstrates normal low-resistance arterial and venous waveforms.  Other findings  Small amount of free fluid near the right at adnexa and pelvic cul-de-sac.  IMPRESSION: Small amount of free fluid in the pelvis and near the right adnexae could reflect a small ruptured ovarian cyst, with otherwise unremarkable pelvic ultrasound, specifically no sonographic findings of ovarian torsion.   Electronically Signed   By: Awilda Metroourtnay  Bloomer   On: 05/16/2014 22:06   [place] Pelvis Complete  05/16/2014   CLINICAL DATA:  Abdominal pain, assess for ovarian torsion.  EXAM: TRANSABDOMINAL AND TRANSVAGINAL ULTRASOUND OF PELVIS  DOPPLER ULTRASOUND OF OVARIES  TECHNIQUE: Both transabdominal and transvaginal ultrasound examinations of the pelvis were performed. Transabdominal technique was performed for global imaging of the pelvis including uterus, ovaries, adnexal regions, and pelvic cul-de-sac.  It was necessary to proceed with endovaginal exam following the transabdominal exam to visualize the adnexa. Color and duplex Doppler ultrasound was utilized to evaluate blood flow to the ovaries.  COMPARISON:  Pelvic ultrasound August 29, 2013  FINDINGS: Uterus  Measurements: 5.6 x 2.2 x 3.3 cm. No fibroids or other mass visualized.  Endometrium  Thickness: 8 mm.  No focal abnormality visualized.   Right ovary  Measurements: 3.3 x 2.7 x 1.8 cm. Normal appearance/no adnexal mass. Dominant follicle/cyst measuring 14 mm, normal.  Left ovary  Measurements: 2.4 x 1.3 x 0.8 cm. Normal appearance/no adnexal mass.  Pulsed Doppler evaluation of both ovaries demonstrates normal low-resistance arterial and venous waveforms.  Other findings  Small amount of free fluid near the right at adnexa and pelvic cul-de-sac.  IMPRESSION: Small amount of free fluid in the pelvis and near the right adnexae could reflect a small ruptured ovarian cyst, with otherwise unremarkable pelvic ultrasound, specifically no sonographic findings of ovarian torsion.   Electronically Signed   By: Awilda Metro   On: 05/16/2014 22:06   Ct Abdomen Pelvis W Contrast  05/16/2014   CLINICAL DATA:  Generalized abdominal pain. Hematuria. Bloody diarrhea for 3 days. Vomiting.  EXAM: CT ABDOMEN AND PELVIS WITH CONTRAST  TECHNIQUE: Multidetector CT imaging of the abdomen and pelvis was performed using the standard protocol following bolus administration of intravenous contrast.  CONTRAST:  80mL OMNIPAQUE IOHEXOL 300 MG/ML  SOLN  COMPARISON:  CT of the abdomen and pelvis 09/08/2013.  FINDINGS: Lung Bases: Tiny 2 mm subpleural nodules in the periphery of the left lower lobe are presumably benign subpleural lymph nodes in this young patient.  Abdomen/Pelvis: Status post cholecystectomy. The appearance of the liver, pancreas, spleen, bilateral adrenal glands and the left kidney is unremarkable. 2 mm nonobstructive calculus in the interpolar collecting system of the right kidney. There is a trace volume of free fluid in the cul-de-sac, presumably physiologic in this young female patient. No larger volume of ascites. No pneumoperitoneum. No pathologic distention of small bowel. No lymphadenopathy identified in the abdomen or pelvis. Uterus and ovaries are unremarkable in appearance. Urinary bladder is normal in appearance.  Musculoskeletal: There are no  aggressive appearing lytic or blastic lesions noted in the visualized portions of the skeleton.  IMPRESSION: 1. 2 mm nonobstructive calculus in the interpolar collecting system of the right kidney may account for the patient's hematuria. 2. Small volume of free fluid in the cul-de-sac, presumably physiologic in this young female patient. 3. Status post cholecystectomy.   Electronically Signed   By: Trudie Reed M.D.   On: 05/16/2014 22:51   [place] Art/ven Flow Abd Pelv Doppler  05/16/2014   CLINICAL DATA:  Abdominal pain, assess for ovarian torsion.  EXAM: TRANSABDOMINAL AND TRANSVAGINAL ULTRASOUND OF PELVIS  DOPPLER ULTRASOUND OF OVARIES  TECHNIQUE: Both transabdominal and transvaginal ultrasound examinations of the pelvis were performed. Transabdominal technique was performed for global imaging of the pelvis including uterus, ovaries, adnexal regions, and pelvic cul-de-sac.  It was necessary to proceed with endovaginal exam following the transabdominal exam to visualize the adnexa. Color and duplex Doppler ultrasound was utilized to evaluate blood flow to the ovaries.  COMPARISON:  Pelvic ultrasound  August 29, 2013  FINDINGS: Uterus  Measurements: 5.6 x 2.2 x 3.3 cm. No fibroids or other mass visualized.  Endometrium  Thickness: 8 mm.  No focal abnormality visualized.  Right ovary  Measurements: 3.3 x 2.7 x 1.8 cm. Normal appearance/no adnexal mass. Dominant follicle/cyst measuring 14 mm, normal.  Left ovary  Measurements: 2.4 x 1.3 x 0.8 cm. Normal appearance/no adnexal mass.  Pulsed Doppler evaluation of both ovaries demonstrates normal low-resistance arterial and venous waveforms.  Other findings  Small amount of free fluid near the right at adnexa and pelvic cul-de-sac.  IMPRESSION: Small amount of free fluid in the pelvis and near the right adnexae could reflect a small ruptured ovarian cyst, with otherwise unremarkable pelvic ultrasound, specifically no sonographic findings of ovarian torsion.    Electronically Signed   By: Awilda Metro   On: 05/16/2014 22:06     EKG Interpretation None      MDM   Final diagnoses:  Bacterial vaginosis  Cervicitis  Ovarian cyst rupture  Kidney stones  Hematuria   Pt presents with abd pain worsening last night associated with reports of bloody diarrhea x 2 episodes, hematuria and dysuria.  Differential diagnosis includes appendicitis, inflammatory bowel disease, STI, renal calculi, and ovarian torsion.  Orders including cbc, cmp, UA, Upreg, percocet and zofran were intiated in triage.  NS 1 L bolus, dilaudid, CT abd/pelvis, pelvic exam and hemoccult were ordered after exam.  Pt was very tender on pelvic exam, specifically with cervical motion tnederness so pelvic US was done.  Pt's hemoccult and rectal exam was negative.  She did have many clue cells on wet prep.  Pt's CT revealed a kidney stone but no concern for appendicitis.  Her US showed a ruptured right ovarian cyst but no torsion.  Pt remained tearful throughout ED course despite pain management, but was consoled with reassurance of lab and imaging results and felt comfortable to be discharged home with follow-up with PCP and GI if bloody diarrhea recurs.  Doubt the presence of PID, appendicitis based on normal wbc and pt remained afebrile.  Pt was given return precautions and follow-up info and discharged with the following meds.  Meds given in ED:  Medications  iohexol (OMNIPAQUE) 300 MG/ML solution 25 mL (25 mLs Oral Contrast Given 05/16/14 1932)  ondansetron (ZOFRAN-ODT) disintegrating tablet 8 mg (8 mg Oral Given 05/16/14 1631)  oxyCODONE-acetaminophen (PERCOCET/ROXICET) 5-325 MG per tablet 1 tablet (1 tablet Oral Given 05/16/14 1631)  sodium chloride 0.9 % bolus 1,000 mL (0 mLs Intravenous Stopped 05/16/14 2034)  HYDROmorphone (DILAUDID) injection 1 mg (1 mg Intravenous Given 05/16/14 1928)  LORazepam (ATIVAN) injection 1 mg (1 mg Intravenous Given 05/16/14 1947)  HYDROmorphone  (DILAUDID) injection 1 mg (1 mg Intravenous Given 05/16/14 2036)  HYDROmorphone (DILAUDID) injection 1 mg (1 mg Intravenous Given 05/16/14 2133)  iohexol (OMNIPAQUE) 300 MG/ML solution 80 mL (80 mLs Intravenous Contrast Given 05/16/14 2206)  HYDROmorphone (DILAUDID) injection 1 mg (1 mg Intravenous Given 05/16/14 2251)  cefTRIAXone (ROCEPHIN) injection 250 mg (250 mg Intramuscular Given 05/16/14 2356)  azithromycin (ZITHROMAX) tablet 1,000 mg (1,000 mg Oral Given 05/16/14 2354)  lidocaine (PF) (XYLOCAINE) 1 % injection 5 mL (0.9 mLs Intradermal Given 05/16/14 2355)    Discharge Medication List as of 05/17/2014 12:00 AM    START taking these medications   Details  metroNIDAZOLE (FLAGYL) 500 MG tablet Take 1 tablet (500 mg total) by mouth 2 (two) times daily., Starting 05/16/2014, Until Discontinued, Print    ondansetron [facility])  4 MG tablet Take 1 tablet (4 mg total) by mouth every 6 (six) hours., Starting 05/16/2014, Until Discontinued, Print    !! oxyCODONE-acetaminophen (PERCOCET/ROXICET) 5-325 MG per tablet Take 1-2 tablets by mouth every 4 (four) hours as needed for moderate pain or severe pain., Starting 05/16/2014, Until Discontinued, Print     !! - Potential duplicate medications found. Please discuss with provider.          Harle Battiest, NP 05/17/14 0200

## 2014-05-17 ENCOUNTER — Telehealth (HOSPITAL_BASED_OUTPATIENT_CLINIC_OR_DEPARTMENT_OTHER): Payer: Self-pay | Admitting: Emergency Medicine

## 2014-05-17 LAB — GC/CHLAMYDIA PROBE AMP
CT PROBE, AMP APTIMA: NEGATIVE
GC PROBE AMP APTIMA: NEGATIVE

## 2014-05-17 LAB — RPR

## 2014-05-17 LAB — HIV ANTIBODY (ROUTINE TESTING W REFLEX): HIV: NONREACTIVE

## 2014-05-17 MED ORDER — METRONIDAZOLE 500 MG PO TABS: 500.0000 mg | ORAL_TABLET | Freq: Two times a day (BID) | ORAL | Status: DC

## 2014-05-17 MED ORDER — ONDANSETRON HCL 4 MG PO TABS: 4.0000 mg | ORAL_TABLET | Freq: Four times a day (QID) | ORAL | Status: DC

## 2014-05-17 MED ORDER — OXYCODONE-ACETAMINOPHEN 5-325 MG PO TABS: 1.0000 | ORAL_TABLET | ORAL | Status: DC | PRN

## 2014-05-17 NOTE — ED Notes (Signed)
Pt reports she has a ride home and will not be driving home. A&O4, ambulatory at d/c with steady gait, NAD

## 2014-05-17 NOTE — Telephone Encounter (Addendum)
Post ED Visit - Positive Culture Follow-up: Successful Patient Follow-Up  Culture assessed and recommendations reviewed by: []  Wes Dulaney, Pharm.D., BCPS []  Celedonio MiyamotoJeremy Frens, Pharm.D., BCPS []  Georgina PillionElizabeth Martin, Pharm.D., BCPS []  [place] CenterMinh Pham, VermontPharm.D., BCPS, AAHIVP []  Estella HuskMichelle Turner, Pharm.D., BCPS, AAHIVP [x]  Red ChristiansSamson Koop, Pharm.D. []  Tennis Mustassie Stewart, Pharm.D.  Positive urine culture E. Coli >100,000 colonies/ml  []  Patient discharged without antimicrobial prescription and treatment is now indicated [x]  Organism is resistant to prescribed ED discharge antimicrobial []  Patient with positive blood cultures  Changes discussed with ED provider:T. Kirichenko PAC New antibiotic prescription : Bactrim DS 1 tablet bid x 3 days, stop [facility]exin [facility]  Contacted patient, date 05/17/14 , time 1518   Berle MullMiller, Caron Ode 05/17/2014, 10:27 AM

## 2014-05-17 NOTE — ED Provider Notes (Signed)
Medical screening examination/treatment/procedure(s) were performed by non-physician practitioner and as supervising physician I was immediately available for consultation/collaboration.   EKG Interpretation None       Nuriyah Hanline K Linker, MD 05/17/14 1516 

## 2014-05-18 ENCOUNTER — Encounter (HOSPITAL_COMMUNITY): Payer: Self-pay | Admitting: Emergency Medicine

## 2014-05-18 DIAGNOSIS — Z8619 Personal history of other infectious and parasitic diseases: Secondary | ICD-10-CM | POA: Insufficient documentation

## 2014-05-18 DIAGNOSIS — Z8742 Personal history of other diseases of the female genital tract: Secondary | ICD-10-CM | POA: Diagnosis not present

## 2014-05-18 DIAGNOSIS — R197 Diarrhea, unspecified: Secondary | ICD-10-CM | POA: Insufficient documentation

## 2014-05-18 DIAGNOSIS — R404 Transient alteration of awareness: Secondary | ICD-10-CM | POA: Insufficient documentation

## 2014-05-18 DIAGNOSIS — Z792 Long term (current) use of antibiotics: Secondary | ICD-10-CM | POA: Diagnosis not present

## 2014-05-18 DIAGNOSIS — R109 Unspecified abdominal pain: Secondary | ICD-10-CM | POA: Diagnosis present

## 2014-05-18 DIAGNOSIS — Z3202 Encounter for pregnancy test, result negative: Secondary | ICD-10-CM | POA: Insufficient documentation

## 2014-05-18 DIAGNOSIS — R112 Nausea with vomiting, unspecified: Secondary | ICD-10-CM | POA: Diagnosis not present

## 2014-05-18 DIAGNOSIS — Z8659 Personal history of other mental and behavioral disorders: Secondary | ICD-10-CM | POA: Diagnosis not present

## 2014-05-18 DIAGNOSIS — Z9089 Acquired absence of other organs: Secondary | ICD-10-CM | POA: Insufficient documentation

## 2014-05-18 DIAGNOSIS — Z79899 Other long term (current) drug therapy: Secondary | ICD-10-CM | POA: Diagnosis not present

## 2014-05-18 DIAGNOSIS — Z87891 Personal history of nicotine dependence: Secondary | ICD-10-CM | POA: Insufficient documentation

## 2014-05-18 DIAGNOSIS — R319 Hematuria, unspecified: Secondary | ICD-10-CM | POA: Insufficient documentation

## 2014-05-18 LAB — COMPREHENSIVE METABOLIC PANEL
ALK PHOS: 73 U/L (ref 39–117)
ALT: 15 U/L (ref 0–35)
ANION GAP: 12 (ref 5–15)
AST: 24 U/L (ref 0–37)
Albumin: 4.5 g/dL (ref 3.5–5.2)
BUN: 7 mg/dL (ref 6–23)
CALCIUM: 9.4 mg/dL (ref 8.4–10.5)
CO2: 26 mEq/L (ref 19–32)
Chloride: 103 mEq/L (ref 96–112)
Creatinine, Ser: 0.53 mg/dL (ref 0.50–1.10)
GFR calc non Af Amer: >90 mL/min (ref >90)
GLUCOSE: 88 mg/dL (ref 70–99)
POTASSIUM: 4 meq/L (ref 3.7–5.3)
Sodium: 141 mEq/L (ref 137–147)
Total Bilirubin: 0.2 mg/dL — ABNORMAL LOW (ref 0.3–1.2)
Total Protein: 7.2 g/dL (ref 6.0–8.3)

## 2014-05-18 LAB — CBC WITH DIFFERENTIAL/PLATELET
Basophils Absolute: 0 10*3/uL (ref 0.0–0.1)
Basophils Relative: 0 % (ref 0–1)
Eosinophils Absolute: 0 10*3/uL (ref 0.0–0.7)
Eosinophils Relative: 1 % (ref 0–5)
HEMATOCRIT: 35.9 % — AB (ref 36.0–46.0)
Hemoglobin: 12.5 g/dL (ref 12.0–15.0)
LYMPHS ABS: 2.2 10*3/uL (ref 0.7–4.0)
Lymphocytes Relative: 42 % (ref 12–46)
MCH: 31.6 pg (ref 26.0–34.0)
MCHC: 34.8 g/dL (ref 30.0–36.0)
MCV: 90.7 fL (ref 78.0–100.0)
MONOS PCT: 9 % (ref 3–12)
Monocytes Absolute: 0.5 10*3/uL (ref 0.1–1.0)
Neutro Abs: 2.6 10*3/uL (ref 1.7–7.7)
Neutrophils Relative %: 48 % (ref 43–77)
Platelets: 229 10*3/uL (ref 150–400)
RBC: 3.96 MIL/uL (ref 3.87–5.11)
RDW: 12 % (ref 11.5–15.5)
WBC: 5.3 10*3/uL (ref 4.0–10.5)

## 2014-05-18 LAB — LIPASE, BLOOD: Lipase: 18 U/L (ref 11–59)

## 2014-05-18 LAB — POC URINE PREG, ED: Preg Test, Ur: NEGATIVE

## 2014-05-18 LAB — URINALYSIS, ROUTINE W REFLEX MICROSCOPIC
Bilirubin Urine: NEGATIVE
GLUCOSE, UA: NEGATIVE mg/dL
KETONES UR: NEGATIVE mg/dL
Nitrite: NEGATIVE
PROTEIN: NEGATIVE mg/dL
Specific Gravity, Urine: 1.009 (ref 1.005–1.030)
Urobilinogen, UA: 0.2 mg/dL (ref 0.0–1.0)
pH: 7 (ref 5.0–8.0)

## 2014-05-18 LAB — URINE MICROSCOPIC-ADD ON

## 2014-05-18 MED ORDER — ONDANSETRON 4 MG PO TBDP
8.0000 mg | ORAL_TABLET | Freq: Once | ORAL | Status: AC
Start: 2014-05-18 — End: 2014-05-18
  Administered 2014-05-18: 8 mg via ORAL
  Filled 2014-05-18: qty 2

## 2014-05-18 NOTE — ED Notes (Addendum)
Pt presents with report of syncopal episode today at work in which she fell from standing and hit head on tile flooring. Pt also c/o abdominal pain with diarrhea and emesis since 05/13/14.   Pt notes seeing bright red blood in diarrhea and in her emesis today. Pt A&Ox4, ambulatory, grips equal.

## 2014-05-19 ENCOUNTER — Telehealth: Payer: Self-pay

## 2014-05-19 ENCOUNTER — Emergency Department (HOSPITAL_COMMUNITY)
Admission: EM | Admit: 2014-05-19 | Discharge: 2014-05-19 | Disposition: A | Discharge status: Home / Self Care | Payer: 59 | Attending: Emergency Medicine | Admitting: Emergency Medicine

## 2014-05-19 DIAGNOSIS — R112 Nausea with vomiting, unspecified: Secondary | ICD-10-CM

## 2014-05-19 DIAGNOSIS — R197 Diarrhea, unspecified: Secondary | ICD-10-CM

## 2014-05-19 MED ORDER — PROCHLORPERAZINE EDISYLATE 5 MG/ML IJ SOLN
10.0000 mg | Freq: Once | INTRAMUSCULAR | Status: AC
  Administered 2014-05-19: 10 mg via INTRAVENOUS
  Filled 2014-05-19: qty 2

## 2014-05-19 MED ORDER — DEXAMETHASONE SODIUM PHOSPHATE 10 MG/ML IJ SOLN
10.0000 mg | Freq: Once | INTRAMUSCULAR | Status: AC
  Administered 2014-05-19: 10 mg via INTRAVENOUS
  Filled 2014-05-19: qty 1

## 2014-05-19 MED ORDER — SODIUM CHLORIDE 0.9 % IV BOLUS (SEPSIS)
1000.0000 mL | Freq: Once | INTRAVENOUS | Status: AC
  Administered 2014-05-19: 1000 mL via INTRAVENOUS

## 2014-05-19 MED ORDER — CIPROFLOXACIN HCL 500 MG PO TABS
500.0000 mg | ORAL_TABLET | Freq: Once | ORAL | Status: AC
  Administered 2014-05-19: 500 mg via ORAL
  Filled 2014-05-19: qty 1

## 2014-05-19 MED ORDER — KETOROLAC TROMETHAMINE 30 MG/ML IJ SOLN
30.0000 mg | Freq: Once | INTRAMUSCULAR | Status: AC
  Administered 2014-05-19: 30 mg via INTRAVENOUS
  Filled 2014-05-19: qty 1

## 2014-05-19 MED ORDER — CIPROFLOXACIN HCL 500 MG PO TABS: 500.0000 mg | ORAL_TABLET | Freq: Two times a day (BID) | ORAL | Status: DC

## 2014-05-19 NOTE — Discharge Instructions (Signed)
Please follow up with your doctors.     Nausea and Vomiting Nausea means you feel sick to your stomach. Throwing up (vomiting) is a reflex where stomach contents come out of your mouth. HOME CARE   Take medicine as told by your doctor.  Do not force yourself to eat. However, you do need to drink fluids.  If you feel like eating, eat a normal diet as told by your doctor.  Eat rice, wheat, potatoes, bread, lean meats, yogurt, fruits, and vegetables.  Avoid high-fat foods.  Drink enough fluids to keep your pee (urine) clear or pale yellow.  Ask your doctor how to replace body fluid losses (rehydrate). Signs of body fluid loss (dehydration) include:  Feeling very thirsty.  Dry lips and mouth.  Feeling dizzy.  Dark pee.  Peeing less than normal.  Feeling confused.  Fast breathing or heart rate. GET HELP RIGHT AWAY IF:   You have blood in your throw up.  You have black or bloody poop (stool).  You have a bad headache or stiff neck.  You feel confused.  You have bad belly (abdominal) pain.  You have chest pain or trouble breathing.  You do not pee at least once every 8 hours.  You have cold, clammy skin.  You keep throwing up after 24 to 48 hours.  You have a fever. MAKE SURE YOU:   Understand these instructions.  Will watch your condition.  Will get help right away if you are not doing well or get worse. Document Released: 03/11/2008 Document Revised: 12/16/2011 Document Reviewed: 02/22/2011 [facility] Patient Information 2015 [place], [place]. This information is not intended to replace advice given to you by your health care provider. Make sure you discuss any questions you have with your health care provider.   Bloody Diarrhea Bloody diarrhea can be caused by many different conditions. Most of the time bloody diarrhea is the result of food poisoning or minor infections. Bloody diarrhea usually improves over 2 to 3 days of rest and fluid replacement. Other  conditions that can cause bloody diarrhea include:  Internal bleeding.  Infection.  Diseases of the bowel and colon. Internal bleeding from an ulcer or bowel disease can be severe and requires hospital care or even surgery. DIAGNOSIS  To find out what is wrong your caregiver may check your:  Stool.  Blood.  Results from a test that looks inside the body (endoscopy). TREATMENT   Get plenty of rest.  Drink enough water and fluids to keep your urine clear or pale yellow.  Do not smoke.  Solid foods and dairy products should be avoided until your illness improves.  As you improve, slowly return to a regular diet with easily-digested foods first. Examples are:  Bananas.  Rice.  Toast.  Crackers. You should only need these for about 2 days before adding more normal foods to your diet.  Avoid spicy or fatty foods as well as caffeine and alcohol for several days.  Medicine to control cramping and diarrhea can relieve symptoms but may prolong some cases of bloody diarrhea. Antibiotics can speed recovery from diarrhea due to some bacterial infections. Call your caregiver if diarrhea does not get better in 3 days. SEEK MEDICAL CARE IF:   You do not improve after 3 days.  Your diarrhea improves but your stool appears black. SEEK IMMEDIATE MEDICAL CARE IF:   You become extremely weak or faint.  You become very sweaty.  You have increased pain or bleeding.  You develop repeated  vomiting.  You vomit and you see blood or the vomit looks black in color.  You have a fever. Document Released: 09/23/2005 Document Revised: 12/16/2011 Document Reviewed: 08/25/2009 [facility] Patient Information 2015 [place], [place]. This information is not intended to replace advice given to you by your health care provider. Make sure you discuss any questions you have with your health care provider.

## 2014-05-19 NOTE — Telephone Encounter (Signed)
MSG left for a return call.      KP 

## 2014-05-19 NOTE — Telephone Encounter (Signed)
We cannot do that

## 2014-05-19 NOTE — Telephone Encounter (Signed)
Patient has been made aware and voiced understanding.     KP 

## 2014-05-19 NOTE — ED Provider Notes (Signed)
CSN: [id number]     Arrival date & time 05/18/14  2048 History   First MD Initiated Contact with Patient 05/19/14 0036     Chief Complaint  Patient presents with  . Loss of Consciousness  . Abdominal Pain   HPI  History provided by the patient in recent medical charts. Patient is a 23 year old female with history of endometriosis, anxiety and depression who presents with continued symptoms of nausea, vomiting and diarrhea as well as a syncopal episode today at work. Patient works at a [employer] states that she stood up felt lightheaded and had a presyncopal episode. She states that she has also had continued occasions of small amounts of hematemesis and blood in her diarrhea. She was seen twice recently in the emergency room diagnosed with UTI initially and started on Keflex. She was also given Flagyl and her last ED visit for her bloody diarrhea symptoms. She has been taking Zofran and oxycodone at home for pain and nausea. She denies any fevers, chills sweats. She complains of diffuse cramps and pains in her abdomen. No other aggravating or alleviating factors. No other associated symptoms. No chest pain or shortness of breath.    Past Medical History  Diagnosis Date  . Endometriosis   . Depression     Diagnosed at age 23  . Anxiety   . Ovarian cyst   . Mononucleosis   . Renal disorder    Past Surgical History  Procedure Laterality Date  . Cholecystectomy    . Tonsillectomy    . Laparoscopic endometriosis fulguration    . Laparoscopy N/A 01/04/2014    Procedure: LAPAROSCOPY DIAGNOSTIC;  Surgeon: Adam PhenixJames G Arnold, MD;  Location: [facility];  Service: Gynecology;  Laterality: N/A;   Family History  Problem Relation Age of Onset  . Hypertension Father   . Diabetes Maternal Grandmother    History  Substance Use Topics  . Smoking status: Former Smoker -- 0.50 packs/day    Types: Cigarettes    Quit date: 03/19/2014  . Smokeless tobacco: Never Used  . Alcohol Use: No   Comment: Former user   OB History   Grav Para Term Preterm Abortions TAB SAB Ect Mult Living   0 0 0 0 0 0 0 0 0 0      Review of Systems  Constitutional: Negative for fever, chills and diaphoresis.  Respiratory: Negative for shortness of breath.   Cardiovascular: Negative for chest pain.  Gastrointestinal: Positive for nausea, vomiting, abdominal pain, diarrhea and blood in stool.  Genitourinary: Positive for hematuria. Negative for dysuria, frequency and flank pain.  All other systems reviewed and are negative.     Allergies  Betadine; Reglan; and Amoxicillin  Home Medications   Prior to Admission medications   Medication Sig Start Date End Date Taking? Authorizing Provider  cephALEXin (KEFLEX) 500 MG capsule Take 1 capsule (500 mg total) by mouth 3 (three) times daily. 05/13/14   Lyanne CoKevin M Campos, MD  Leuprolide Acetate (LUPRON IJ) Inject 1 Applicatorful as directed every 6 (six) months.    Historical Provider, MD  metroNIDAZOLE (FLAGYL) 500 MG tablet Take 1 tablet (500 mg total) by mouth 2 (two) times daily. 05/17/14   Harle BattiestElizabeth Tysinger, NP  ondansetron (ZOFRAN) 4 MG tablet Take 1 tablet (4 mg total) by mouth every 6 (six) hours. 05/17/14   Harle BattiestElizabeth Tysinger, NP  oxyCODONE-acetaminophen (PERCOCET/ROXICET) 5-325 MG per tablet Take 1 tablet by mouth every 4 (four) hours as needed for severe pain. 05/13/14   Vania ReaKevin M  Patria Mane, MD  oxyCODONE-acetaminophen (PERCOCET/ROXICET) 5-325 MG per tablet Take 1-2 tablets by mouth every 4 (four) hours as needed for moderate pain or severe pain. 05/17/14   Harle Battiest, NP  phenazopyridine (PYRIDIUM) 200 MG tablet Take 1 tablet (200 mg total) by mouth 3 (three) times daily. 05/13/14   Lyanne Co, MD   BP 110/68  Pulse 90  Temp(Src) 97.7 F (36.5 C) (Oral)  Resp 24  Ht 5\' 3"  (1.6 m)  Wt 105 lb (47.628 kg)  BMI 18.60 kg/m2  SpO2 100%  LMP 05/13/2014 Physical Exam  Nursing note and vitals reviewed. Constitutional: She is oriented to person,  place, and time. She appears well-developed and well-nourished. No distress.  HENT:  Head: Normocephalic.  Cardiovascular: Normal rate and regular rhythm.   Pulmonary/Chest: Effort normal and breath sounds normal. No respiratory distress.  Abdominal: Soft. She exhibits no distension. Bowel sounds are increased. There is tenderness. There is no rebound, no guarding and no CVA tenderness.  Diffuse abdominal tenderness.  Neurological: She is alert and oriented to person, place, and time.  Skin: Skin is warm and dry. No rash noted.  Psychiatric: She has a normal mood and affect. Her behavior is normal.    ED Course  Procedures   COORDINATION OF CARE:  Nursing notes reviewed. Vital signs reviewed. Initial pt interview and examination performed.   Filed Vitals:   05/18/14 2124 05/19/14 0045 05/19/14 0056  BP: 130/64 110/68   Pulse: 100 90   Temp: 97.7 F (36.5 C)    TempSrc: Oral    Resp: 20 24   Height: 5\' 3"  (1.6 m)    Weight: 105 lb (47.628 kg)    SpO2: 100% 100% 100%    12:59 AM-patient seen and evaluated. Unremarkable vital signs. She has been evaluated twice recently including extensive workup with ultrasound studies and abdominal CT. Possible signs of ruptured ovarian cyst. Intrarenal kidney stone no other concerning findings. No significant signs of severe colitis. No appendicitis.  Laboratory testing without significant change today. Normal WBC. Patient may be discharged home to followup with her doctors.     Treatment plan initiated: Medications  ondansetron (ZOFRAN-ODT) disintegrating tablet 8 mg (8 mg Oral Given 05/18/14 2133)    Results for orders placed during the hospital encounter of 05/19/14  CBC WITH DIFFERENTIAL      Result Value Ref Range   WBC 5.3  4.0 - 10.5 K/uL   RBC 3.96  3.87 - 5.11 MIL/uL   Hemoglobin 12.5  12.0 - 15.0 g/dL   HCT 16.1 (*) 09.6 - 04.5 %   MCV 90.7  78.0 - 100.0 fL   MCH 31.6  26.0 - 34.0 pg   MCHC 34.8  30.0 - 36.0 g/dL   RDW  40.9  81.1 - 91.4 %   Platelets 229  150 - 400 K/uL   Neutrophils Relative % 48  43 - 77 %   Neutro Abs 2.6  1.7 - 7.7 K/uL   Lymphocytes Relative 42  12 - 46 %   Lymphs Abs 2.2  0.7 - 4.0 K/uL   Monocytes Relative 9  3 - 12 %   Monocytes Absolute 0.5  0.1 - 1.0 K/uL   Eosinophils Relative 1  0 - 5 %   Eosinophils Absolute 0.0  0.0 - 0.7 K/uL   Basophils Relative 0  0 - 1 %   Basophils Absolute 0.0  0.0 - 0.1 K/uL  COMPREHENSIVE METABOLIC PANEL      Result  Value Ref Range   Sodium 141  137 - 147 mEq/L   Potassium 4.0  3.7 - 5.3 mEq/L   Chloride 103  96 - 112 mEq/L   CO2 26  19 - 32 mEq/L   Glucose, Bld 88  70 - 99 mg/dL   BUN 7  6 - 23 mg/dL   Creatinine, Ser 1.61  0.50 - 1.10 mg/dL   Calcium 9.4  8.4 - 09.6 mg/dL   Total Protein 7.2  6.0 - 8.3 g/dL   Albumin 4.5  3.5 - 5.2 g/dL   AST 24  0 - 37 U/L   ALT 15  0 - 35 U/L   Alkaline Phosphatase 73  39 - 117 U/L   Total Bilirubin 0.2 (*) 0.3 - 1.2 mg/dL   GFR calc non Af Amer >90  >90 mL/min   GFR calc Af Amer >90  >90 mL/min   Anion gap 12  5 - 15  LIPASE, BLOOD      Result Value Ref Range   Lipase 18  11 - 59 U/L  URINALYSIS, ROUTINE W REFLEX MICROSCOPIC      Result Value Ref Range   Color, Urine YELLOW  YELLOW   APPearance HAZY (*) CLEAR   Specific Gravity, Urine 1.009  1.005 - 1.030   pH 7.0  5.0 - 8.0   Glucose, UA NEGATIVE  NEGATIVE mg/dL   Hgb urine dipstick LARGE (*) NEGATIVE   Bilirubin Urine NEGATIVE  NEGATIVE   Ketones, ur NEGATIVE  NEGATIVE mg/dL   Protein, ur NEGATIVE  NEGATIVE mg/dL   Urobilinogen, UA 0.2  0.0 - 1.0 mg/dL   Nitrite NEGATIVE  NEGATIVE   Leukocytes, UA SMALL (*) NEGATIVE  URINE MICROSCOPIC-ADD ON      Result Value Ref Range   Squamous Epithelial / LPF FEW (*) RARE   WBC, UA 3-6  <3 WBC/hpf   RBC / HPF 21-50  <3 RBC/hpf   Bacteria, UA RARE  RARE  POC URINE PREG, ED      Result Value Ref Range   Preg Test, Ur NEGATIVE  NEGATIVE    MDM   Final diagnoses:  Nausea, vomiting and  diarrhea        Angus Seller, PA-C 05/19/14 0543

## 2014-05-19 NOTE — Telephone Encounter (Signed)
Caller name:Quinita Relation to pt: Call back number:[phone] Pharmacy:  Reason for call: Jenna Blake called to see if she could get a prescription for Tramadol to do her until she goes to her pain management appt.

## 2014-05-19 NOTE — ED Provider Notes (Signed)
Medical screening examination/treatment/procedure(s) were performed by non-physician practitioner and as supervising physician I was immediately available for consultation/collaboration.     Geoffery Lyonsouglas Ethan Clayburn, MD 05/19/14 [phone]

## 2014-05-19 NOTE — Telephone Encounter (Signed)
Patient is in the ED and got Oxy on 8/7 and then on 8/11. She is currently in the ED for NVD. Please advise      KP

## 2014-05-21 ENCOUNTER — Encounter (HOSPITAL_COMMUNITY): Payer: Self-pay | Admitting: Emergency Medicine

## 2014-05-21 ENCOUNTER — Emergency Department (INDEPENDENT_AMBULATORY_CARE_PROVIDER_SITE_OTHER)
Admission: EM | Admit: 2014-05-21 | Discharge: 2014-05-21 | Disposition: A | Discharge status: Home / Self Care | Payer: 59 | Source: Home / Self Care | Attending: Family Medicine | Admitting: Family Medicine

## 2014-05-21 DIAGNOSIS — R197 Diarrhea, unspecified: Secondary | ICD-10-CM

## 2014-05-21 DIAGNOSIS — R05 Cough: Secondary | ICD-10-CM

## 2014-05-21 DIAGNOSIS — R059 Cough, unspecified: Secondary | ICD-10-CM

## 2014-05-21 NOTE — ED Provider Notes (Signed)
CSN: [id number]     Arrival date & time 05/21/14  4098 History   First MD Initiated Contact with Patient 05/21/14 1010     Chief Complaint  Patient presents with  . Cough   (Consider location/radiation/quality/duration/timing/severity/associated sxs/prior Treatment) HPI Comments: History obtained from patient and from review of recent medical records. This patient has had 4 visits to the emergency room and two visits to [facility] this month along with two telephone requests (one to her PCP and one to her ObGyn) for early refills of tramadol that she reports taking for chronic pelvic pain. She has recently been referred to both psychiatry and pain management.  She presents today with complaints similar to those that she has presented with for the past one month. She reports persistent cough, diarrhea with occasional blood in stool with large bowel movement or when wiping after BM.  Testing over past month has consisted of CXR, CBC x2, BMP, CMP, UA, UPT, lipase, LS spine films, pelvic and transvaginal U/S and CT A/P. Only pertinent result was that of evidence of UTI and this has been adequately treated with TMP/SMX per urine C&S. She has also been treated with Flagyl (for diarrhea), immodium, flonase, tussionex, medrol dose pack, rocephin, percocet, ciprofloxacin, cephalexin, zofran, nexium, pyridium.  Denies having PCP, however there are recent notes on chart from [facility] and [facility].   The history is provided by the patient.    Past Medical History  Diagnosis Date  . Endometriosis   . Depression     Diagnosed at age 43  . Anxiety   . Ovarian cyst   . Mononucleosis   . Renal disorder    Past Surgical History  Procedure Laterality Date  . Cholecystectomy    . Tonsillectomy    . Laparoscopic endometriosis fulguration    . Laparoscopy N/A 01/04/2014    Procedure: LAPAROSCOPY DIAGNOSTIC;  Surgeon: Adam Phenix, MD;  Location: [facility];  Service: Gynecology;  Laterality:  N/A;   Family History  Problem Relation Age of Onset  . Hypertension Father   . Diabetes Maternal Grandmother    History  Substance Use Topics  . Smoking status: Former Smoker -- 0.50 packs/day    Types: Cigarettes    Quit date: 03/19/2014  . Smokeless tobacco: Never Used  . Alcohol Use: No     Comment: Former user   OB History   Grav Para Term Preterm Abortions TAB SAB Ect Mult Living   0 0 0 0 0 0 0 0 0 0      Review of Systems  Constitutional: Positive for appetite change and fatigue.  HENT: Positive for congestion and rhinorrhea.   Eyes: Negative.   Respiratory: Positive for cough.   Cardiovascular: Negative.   Gastrointestinal: Positive for diarrhea and blood in stool.  Endocrine: Negative for polydipsia, polyphagia and polyuria.  Genitourinary: Negative.   Musculoskeletal: Positive for back pain.  Skin: Negative.   Neurological: Negative.   Psychiatric/Behavioral:       Hx of chronic pain, anxiety and depression    Allergies  Betadine; Reglan; and Amoxicillin  Home Medications   Prior to Admission medications   Medication Sig Start Date End Date Taking? Authorizing Provider  cephALEXin (KEFLEX) 500 MG capsule Take 1 capsule (500 mg total) by mouth 3 (three) times daily. 05/13/14  Yes Lyanne Co, MD  ciprofloxacin (CIPRO) 500 MG tablet Take 1 tablet (500 mg total) by mouth 2 (two) times daily. One po bid x 7 days 05/19/14  Yes Phill MutterPeter S Dammen, PA-C  metroNIDAZOLE (FLAGYL) 500 MG tablet Take 1 tablet (500 mg total) by mouth 2 (two) times daily. 05/17/14  Yes Harle BattiestElizabeth Tysinger, NP  ondansetron (ZOFRAN) 4 MG tablet Take 1 tablet (4 mg total) by mouth every 6 (six) hours. 05/17/14   Harle BattiestElizabeth Tysinger, NP  oxyCODONE-acetaminophen (PERCOCET/ROXICET) 5-325 MG per tablet Take 1 tablet by mouth every 4 (four) hours as needed for severe pain. 05/13/14   Lyanne CoKevin M Campos, MD  oxyCODONE-acetaminophen (PERCOCET/ROXICET) 5-325 MG per tablet Take 1-2 tablets by mouth every 4 (four)  hours as needed for moderate pain or severe pain. 05/17/14   Harle BattiestElizabeth Tysinger, NP  phenazopyridine (PYRIDIUM) 200 MG tablet Take 1 tablet (200 mg total) by mouth 3 (three) times daily. 05/13/14   Lyanne CoKevin M Campos, MD   BP 126/76  Pulse 89  Temp(Src) 98.3 F (36.8 C) (Oral)  Resp 16  SpO2 97%  LMP 05/13/2014 Physical Exam  Nursing note and vitals reviewed. Constitutional: She is oriented to person, place, and time. She appears well-developed and well-nourished. No distress.  HENT:  Head: Normocephalic and atraumatic.  Right Ear: Hearing, tympanic membrane, external ear and ear canal normal.  Left Ear: Hearing, tympanic membrane, external ear and ear canal normal.  Nose: Nose normal.  Mouth/Throat: Uvula is midline, oropharynx is clear and moist and mucous membranes are normal.  Eyes: Conjunctivae and EOM are normal. Pupils are equal, round, and reactive to light.  Neck: Normal range of motion. Neck supple.  Cardiovascular: Normal rate, regular rhythm and normal heart sounds.   Pulmonary/Chest: Effort normal and breath sounds normal. No stridor. No respiratory distress. She has no wheezes.  Abdominal: Soft. Bowel sounds are normal. She exhibits no distension. There is no tenderness.  Genitourinary: Guaiac negative stool.  Musculoskeletal: Normal range of motion.  Lymphadenopathy:    She has no cervical adenopathy.  Neurological: She is alert and oriented to person, place, and time.  Skin: Skin is warm and dry. No rash noted. No erythema.  Psychiatric: Her speech is normal and behavior is normal. Her mood appears anxious.    ED Course  Procedures (including critical care time) Labs Review Labs Reviewed - No data to display  Imaging Review No results found.   MDM   1. Cough   2. Diarrhea    Exam without worrisome finding and vital signs normal. No clinical indication for additional medications. Given recent and extensive testing (nearly all of which has been without  abnormality) and multiple recent medications, I will refer this patient back to her PCP. I will also strongly encourage her to follow up with a mental health professional as I suspect three is a component of anxiety that is involved or fueling multiple medical visits. Suggested the use of an OTC probiotic to help with diarrhea.     Ria ClockJennifer Kosinski H Farzad Tibbetts, [place]PA 05/21/14 1104

## 2014-05-21 NOTE — Discharge Instructions (Signed)
You have had an extensive evaluation over the past one month. All of your testing has been normal with the exception of history of a urinary tract infection that has been adequately treated. I would strongly encourage you to follow up with your primary care doctor at [facility] should your symptoms continue. Your exam and vital signs do not indicate the need for additional medications at this time. You may wish to begin taking an over the counter probiotic to help with diarrhea. I would also strongly encourage you to follow up with both the pain management clinic and your counselor or psychiatrist as advised by your other medical providers. I suspect you are having quite a bit of anxiety about your health and these specialists will be able to help you cope with this.  Cough, Adult  A cough is a reflex that helps clear your throat and airways. It can help heal the body or may be a reaction to an irritated airway. A cough may only last 2 or 3 weeks (acute) or may last more than 8 weeks (chronic).  CAUSES Acute cough:  Viral or bacterial infections. Chronic cough:  Infections.  Allergies.  Asthma.  Post-nasal drip.  Smoking.  Heartburn or acid reflux.  Some medicines.  Chronic lung problems (COPD).  Cancer. SYMPTOMS   Cough.  Fever.  Chest pain.  Increased breathing rate.  High-pitched whistling sound when breathing (wheezing).  Colored mucus that you cough up (sputum). TREATMENT   A bacterial cough may be treated with antibiotic medicine.  A viral cough must run its course and will not respond to antibiotics.  Your caregiver may recommend other treatments if you have a chronic cough. HOME CARE INSTRUCTIONS   Only take over-the-counter or prescription medicines for pain, discomfort, or fever as directed by your caregiver. Use cough suppressants only as directed by your caregiver.  Use a cold steam vaporizer or humidifier in your bedroom or home to help loosen  secretions.  Sleep in a semi-upright position if your cough is worse at night.  Rest as needed.  Stop smoking if you smoke. SEEK IMMEDIATE MEDICAL CARE IF:   You have pus in your sputum.  Your cough starts to worsen.  You cannot control your cough with suppressants and are losing sleep.  You begin coughing up blood.  You have difficulty breathing.  You develop pain which is getting worse or is uncontrolled with medicine.  You have a fever. MAKE SURE YOU:   Understand these instructions.  Will watch your condition.  Will get help right away if you are not doing well or get worse. Document Released: 03/22/2011 Document Revised: 12/16/2011 Document Reviewed: 03/22/2011 [facility] Patient Information 2015 [place]. This information is not intended to replace advice given to you by your health care provider. Make sure you discuss any questions you have with your health care provider.  Chronic Diarrhea Diarrhea is frequent loose and watery bowel movements. It can cause you to feel weak and dehydrated. Dehydration can cause you to become tired and thirsty and to have a dry mouth, decreased urination, and dark yellow urine. Diarrhea is a sign of another problem, most often an infection that will not last long. In most cases, diarrhea lasts 2-3 days. Diarrhea that lasts longer than 4 weeks is called long-lasting (chronic) diarrhea. It is important to treat your diarrhea as directed by your health care provider to lessen or prevent future episodes of diarrhea.  CAUSES  There are many causes of chronic diarrhea.  The following are some possible causes:   Gastrointestinal infections caused by viruses, bacteria, or parasites.   Food poisoning or food allergies.   Certain medicines, such as antibiotics, chemotherapy, and laxatives.   Artificial sweeteners and fructose.   Digestive disorders, such as celiac disease and inflammatory bowel diseases.   Irritable bowel  syndrome.  Some disorders of the pancreas.  Disorders of the thyroid.  Reduced blood flow to the intestines.  Cancer. Sometimes the cause of chronic diarrhea is unknown. RISK FACTORS  Having a severely weakened immune system, such as from HIV or AIDS.   Taking certain types of cancer-fighting drugs (such as with chemotherapy) or other medicines.   Having had a recent organ transplant.   Having a portion of the stomach or small bowel removed.   Traveling to countries where food and water supplies are often contaminated.  SYMPTOMS  In addition to frequent, loose stools, diarrhea may cause:   Cramping.   Abdominal pain.   Nausea.   Fever.  Fatigue.  Urgent need to use the bathroom.  Loss of bowel control. DIAGNOSIS  Your health care provider must take a careful history and perform a physical exam. Tests given are based on your symptoms and history. Tests may include:   Blood or stool tests. Three or more stool samples may be examined. Stool cultures may be used to test for bacteria or parasites.   X-rays.   A procedure in which a thin tube is inserted into the mouth or rectum (endoscopy). This allows the health care provider to look inside the intestine.  TREATMENT   Treatment is aimed at correcting the cause of the diarrhea when possible.  Diarrhea caused by an infection can often be treated with antibiotic medicines.  Diarrhea not caused by an infection may require you to take long-term medicine or have surgery. Specific treatment should be discussed with your health care provider.  If the cause cannot be determined, treatment aims to relieve symptoms and prevent dehydration. Serious health problems can occur if you do not maintain proper fluid levels. Treatment may include:  Taking an oral rehydration solution (ORS).  Not drinking beverages that contain caffeine (such as tea, coffee, and soft drinks).  Not drinking alcohol.  Maintaining  well-balanced nutrition to help you recover faster. HOME CARE INSTRUCTIONS   Drink enough fluids to keep urine clear or pale yellow. Drink 1 cup (8 oz) of fluid for each diarrhea episode. Avoid fluids that contain simple sugars, fruit juices, whole milk products, and sodas. Hydrate with an ORS. You may purchase the ORS or prepare it at home by mixing the following ingredients together:   - tsp (1.7-3  mL) table salt.   tsp (3  mL) baking soda.   tsp (1.7 mL) salt substitute containing potassium chloride.  1 tbsp (20 mL) sugar.  4.2 c (1 L) of water.   Certain foods and beverages may increase the speed at which food moves through the gastrointestinal (GI) tract. These foods and beverages should be avoided. They include:  Caffeinated and alcoholic beverages.  High-fiber foods, such as raw fruits and vegetables, nuts, seeds, and whole grain breads and cereals.  Foods and beverages sweetened with sugar alcohols, such as xylitol, sorbitol, and mannitol.   Some foods may be well tolerated and may help thicken stool. These include:  Starchy foods, such as rice, toast, pasta, low-sugar cereal, oatmeal, grits, baked potatoes, crackers, and bagels.  Bananas.  Applesauce.  Add probiotic-rich foods to help increase healthy  bacteria in the GI tract. These include yogurt and fermented milk products.  Wash your hands well after each diarrhea episode.  Only take over-the-counter or prescription medicines as directed by your health care provider.  Take a warm bath to relieve any burning or pain from frequent diarrhea episodes. SEEK MEDICAL CARE IF:   You are not urinating as often.  Your urine is a dark color.  You become very tired or dizzy.  You have severe pain in the abdomen or rectum.  Your have blood or pus in your stools.  Your stools look black and tarry. SEEK IMMEDIATE MEDICAL CARE IF:   You are unable to keep fluids down.  You have persistent vomiting.  You have  blood in your stool.  Your stools are black and tarry.  You do not urinate in 6-8 hours, or there is only a small amount of very dark urine.  You have abdominal pain that increases or localizes.  You have weakness, dizziness, confusion, or lightheadedness.  You have a severe headache.  Your diarrhea gets worse or does not get better.  You have a fever or persistent symptoms for more than 2-3 days.  You have a fever and your symptoms suddenly get worse. MAKE SURE YOU:   Understand these instructions.  Will watch your condition.  Will get help right away if you are not doing well or get worse. Document Released: 12/14/2003 Document Revised: 09/28/2013 Document Reviewed: 03/18/2013 [facility] Patient Information 2015 [place], [place]. This information is not intended to replace advice given to you by your health care provider. Make sure you discuss any questions you have with your health care provider.  Bloody Diarrhea Bloody diarrhea can be caused by many different conditions. Most of the time bloody diarrhea is the result of food poisoning or minor infections. Bloody diarrhea usually improves over 2 to 3 days of rest and fluid replacement. Other conditions that can cause bloody diarrhea include:  Internal bleeding.  Infection.  Diseases of the bowel and colon. Internal bleeding from an ulcer or bowel disease can be severe and requires hospital care or even surgery. DIAGNOSIS  To find out what is wrong your caregiver may check your:  Stool.  Blood.  Results from a test that looks inside the body (endoscopy). TREATMENT   Get plenty of rest.  Drink enough water and fluids to keep your urine clear or pale yellow.  Do not smoke.  Solid foods and dairy products should be avoided until your illness improves.  As you improve, slowly return to a regular diet with easily-digested foods first. Examples are:  Bananas.  Rice.  Toast.  Crackers. You should only need these  for about 2 days before adding more normal foods to your diet.  Avoid spicy or fatty foods as well as caffeine and alcohol for several days.  Medicine to control cramping and diarrhea can relieve symptoms but may prolong some cases of bloody diarrhea. Antibiotics can speed recovery from diarrhea due to some bacterial infections. Call your caregiver if diarrhea does not get better in 3 days. SEEK MEDICAL CARE IF:   You do not improve after 3 days.  Your diarrhea improves but your stool appears black. SEEK IMMEDIATE MEDICAL CARE IF:   You become extremely weak or faint.  You become very sweaty.  You have increased pain or bleeding.  You develop repeated vomiting.  You vomit and you see blood or the vomit looks black in color.  You have a fever. Document Released:  09/23/2005 Document Revised: 12/16/2011 Document Reviewed: 08/25/2009 [employer] Patient Information 2015 [place], [place]. This information is not intended to replace advice given to you by your health care provider. Make sure you discuss any questions you have with your health care provider.

## 2014-05-21 NOTE — ED Provider Notes (Signed)
Medical screening examination/treatment/procedure(s) were performed by resident physician or non-physician practitioner and as supervising physician I was immediately available for consultation/collaboration.   Amalio Loe DOUGLAS MD.   Dayla Gasca D Elizabethann Lackey, MD 05/21/14 1246 

## 2014-05-21 NOTE — ED Notes (Signed)
C/o cough x 2 weeks, and loose stools w blood x 2 weeks. Causing her to miss work

## 2014-06-06 ENCOUNTER — Ambulatory Visit: Payer: 59 | Attending: Family Medicine | Admitting: Physical Therapy

## 2014-06-19 ENCOUNTER — Encounter (HOSPITAL_COMMUNITY): Payer: Self-pay | Admitting: Emergency Medicine

## 2014-06-19 ENCOUNTER — Emergency Department (HOSPITAL_COMMUNITY)
Admission: EM | Admit: 2014-06-19 | Discharge: 2014-06-19 | Disposition: A | Discharge status: Home / Self Care | Payer: 59 | Attending: Emergency Medicine | Admitting: Emergency Medicine

## 2014-06-19 DIAGNOSIS — Z87891 Personal history of nicotine dependence: Secondary | ICD-10-CM | POA: Insufficient documentation

## 2014-06-19 DIAGNOSIS — R197 Diarrhea, unspecified: Secondary | ICD-10-CM | POA: Diagnosis not present

## 2014-06-19 DIAGNOSIS — Z3202 Encounter for pregnancy test, result negative: Secondary | ICD-10-CM | POA: Diagnosis not present

## 2014-06-19 DIAGNOSIS — G8929 Other chronic pain: Secondary | ICD-10-CM | POA: Insufficient documentation

## 2014-06-19 DIAGNOSIS — F411 Generalized anxiety disorder: Secondary | ICD-10-CM | POA: Diagnosis not present

## 2014-06-19 DIAGNOSIS — Z8742 Personal history of other diseases of the female genital tract: Secondary | ICD-10-CM | POA: Insufficient documentation

## 2014-06-19 DIAGNOSIS — R112 Nausea with vomiting, unspecified: Secondary | ICD-10-CM | POA: Diagnosis present

## 2014-06-19 DIAGNOSIS — Z8619 Personal history of other infectious and parasitic diseases: Secondary | ICD-10-CM | POA: Insufficient documentation

## 2014-06-19 DIAGNOSIS — Z880 Allergy status to penicillin: Secondary | ICD-10-CM | POA: Diagnosis not present

## 2014-06-19 DIAGNOSIS — F419 Anxiety disorder, unspecified: Secondary | ICD-10-CM

## 2014-06-19 DIAGNOSIS — R109 Unspecified abdominal pain: Secondary | ICD-10-CM | POA: Insufficient documentation

## 2014-06-19 LAB — CBC WITH DIFFERENTIAL/PLATELET
Basophils Absolute: 0 K/uL (ref 0.0–0.1)
Basophils Relative: 0 % (ref 0–1)
Eosinophils Absolute: 0 K/uL (ref 0.0–0.7)
Eosinophils Relative: 0 % (ref 0–5)
HCT: 40.6 % (ref 36.0–46.0)
Hemoglobin: 15 g/dL (ref 12.0–15.0)
Lymphocytes Relative: 30 % (ref 12–46)
Lymphs Abs: 1.8 K/uL (ref 0.7–4.0)
MCH: 33.1 pg (ref 26.0–34.0)
MCHC: 36.9 g/dL — ABNORMAL HIGH (ref 30.0–36.0)
MCV: 89.6 fL (ref 78.0–100.0)
Monocytes Absolute: 0.6 K/uL (ref 0.1–1.0)
Monocytes Relative: 9 % (ref 3–12)
Neutro Abs: 3.7 K/uL (ref 1.7–7.7)
Neutrophils Relative %: 61 % (ref 43–77)
Platelets: 278 K/uL (ref 150–400)
RBC: 4.53 MIL/uL (ref 3.87–5.11)
RDW: 12.1 % (ref 11.5–15.5)
WBC: 6.1 K/uL (ref 4.0–10.5)

## 2014-06-19 LAB — COMPREHENSIVE METABOLIC PANEL
ALK PHOS: 92 U/L (ref 39–117)
ALT: 27 U/L (ref 0–35)
AST: 26 U/L (ref 0–37)
Albumin: 5.4 g/dL — ABNORMAL HIGH (ref 3.5–5.2)
Anion gap: 18 — ABNORMAL HIGH (ref 5–15)
BILIRUBIN TOTAL: 1 mg/dL (ref 0.3–1.2)
BUN: 8 mg/dL (ref 6–23)
CO2: 23 mEq/L (ref 19–32)
CREATININE: 0.67 mg/dL (ref 0.50–1.10)
Calcium: 10.3 mg/dL (ref 8.4–10.5)
Chloride: 98 mEq/L (ref 96–112)
GFR calc Af Amer: >90 mL/min (ref >90)
GFR calc non Af Amer: >90 mL/min (ref >90)
Glucose, Bld: 102 mg/dL — ABNORMAL HIGH (ref 70–99)
POTASSIUM: 3.3 meq/L — AB (ref 3.7–5.3)
Sodium: 139 mEq/L (ref 137–147)
Total Protein: 8.4 g/dL — ABNORMAL HIGH (ref 6.0–8.3)

## 2014-06-19 LAB — URINALYSIS, ROUTINE W REFLEX MICROSCOPIC
Bilirubin Urine: NEGATIVE
Glucose, UA: NEGATIVE mg/dL
Ketones, ur: 15 mg/dL — AB
Leukocytes, UA: NEGATIVE
Nitrite: NEGATIVE
Protein, ur: NEGATIVE mg/dL
Specific Gravity, Urine: 1.009 (ref 1.005–1.030)
Urobilinogen, UA: 0.2 mg/dL (ref 0.0–1.0)
pH: 6.5 (ref 5.0–8.0)

## 2014-06-19 LAB — URINE MICROSCOPIC-ADD ON

## 2014-06-19 LAB — LIPASE, BLOOD: Lipase: 14 U/L (ref 11–59)

## 2014-06-19 LAB — POC URINE PREG, ED: Preg Test, Ur: NEGATIVE

## 2014-06-19 MED ORDER — PROCHLORPERAZINE EDISYLATE 5 MG/ML IJ SOLN
10.0000 mg | Freq: Once | INTRAMUSCULAR | Status: AC
  Administered 2014-06-19: 10 mg via INTRAVENOUS
  Filled 2014-06-19: qty 2

## 2014-06-19 MED ORDER — PROMETHAZINE HCL 25 MG/ML IJ SOLN
12.5000 mg | Freq: Once | INTRAMUSCULAR | Status: AC
  Administered 2014-06-19: 12.5 mg via INTRAVENOUS
  Filled 2014-06-19: qty 1

## 2014-06-19 MED ORDER — SODIUM CHLORIDE 0.9 % IV SOLN
1000.0000 mL | Freq: Once | INTRAVENOUS | Status: AC
Start: 2014-06-19 — End: 2014-06-19
  Administered 2014-06-19: 1000 mL via INTRAVENOUS

## 2014-06-19 MED ORDER — ONDANSETRON HCL 4 MG/2ML IJ SOLN
4.0000 mg | Freq: Once | INTRAMUSCULAR | Status: AC
  Administered 2014-06-19: 4 mg via INTRAVENOUS
  Filled 2014-06-19: qty 2

## 2014-06-19 MED ORDER — LORAZEPAM 2 MG/ML IJ SOLN
1.0000 mg | Freq: Once | INTRAMUSCULAR | Status: AC
  Administered 2014-06-19: 1 mg via INTRAVENOUS
  Filled 2014-06-19: qty 1

## 2014-06-19 MED ORDER — HYDROMORPHONE HCL PF 1 MG/ML IJ SOLN
1.0000 mg | Freq: Once | INTRAMUSCULAR | Status: AC
  Administered 2014-06-19: 1 mg via INTRAVENOUS
  Filled 2014-06-19: qty 1

## 2014-06-19 MED ORDER — ONDANSETRON HCL 8 MG PO TABS: 8.0000 mg | ORAL_TABLET | Freq: Three times a day (TID) | ORAL | Status: DC | PRN

## 2014-06-19 NOTE — ED Notes (Signed)
Pt. Stated, Jenna Blake been sick for 2 days with N/V and stomach pain. I took a suppository Phenergan and no help

## 2014-06-19 NOTE — ED Notes (Signed)
MD made aware of pt's continued pain and nausea.

## 2014-06-19 NOTE — ED Provider Notes (Signed)
CSN: [id number]     Arrival date & time 06/19/14  1191 History   First MD Initiated Contact with Patient 06/19/14 [id number]     Chief Complaint  Patient presents with  . Abdominal Pain  . Emesis     (Consider location/radiation/quality/duration/timing/severity/associated sxs/prior Treatment) HPI  Jenna Blake is a 23 y.o. female who presents for evaluation of nausea, vomiting, diarrhea, and abdominal pain, for 2 days. Emesis, and stool, both green in color. She denies fever, known sick contacts, or known food toxin ingestion. She feels that her anxiety is out of control, and she will have a panic attack. She came here ambulatory, by private vehicle. She does not have regular periods, and into her injections which have given for endometriosis. There are no other known modifying factors.  Past Medical History  Diagnosis Date  . Endometriosis   . Depression     Diagnosed at age 33  . Anxiety   . Ovarian cyst   . Mononucleosis   . Renal disorder    Past Surgical History  Procedure Laterality Date  . Cholecystectomy    . Tonsillectomy    . Laparoscopic endometriosis fulguration    . Laparoscopy N/A 01/04/2014    Procedure: LAPAROSCOPY DIAGNOSTIC;  Surgeon: Adam Phenix, MD;  Location: [facility];  Service: Gynecology;  Laterality: N/A;   Family History  Problem Relation Age of Onset  . Hypertension Father   . Diabetes Maternal Grandmother    History  Substance Use Topics  . Smoking status: Former Smoker -- 0.50 packs/day    Types: Cigarettes    Quit date: 03/19/2014  . Smokeless tobacco: Never Used  . Alcohol Use: No     Comment: Former user   OB History   Grav Para Term Preterm Abortions TAB SAB Ect Mult Living       Review of Systems  All other systems reviewed and are negative.     Allergies  Betadine; Reglan; and Amoxicillin  Home Medications   Prior to Admission medications   Medication Sig Start Date End Date Taking? Authorizing Provider   acetaminophen (TYLENOL) 500 MG tablet Take 1,000 mg by mouth every 6 (six) hours as needed for mild pain.   Yes Historical Provider, MD  promethazine (PHENERGAN) 25 MG tablet Take 25 mg by mouth every 6 (six) hours as needed for nausea or vomiting.   Yes Historical Provider, MD  leuprolide (LUPRON DEPOT) 11.25 MG injection Inject 11.25 mg into the muscle every 3 (three) months.     Historical Provider, MD  ondansetron (ZOFRAN) 8 MG tablet Take 1 tablet (8 mg total) by mouth every 8 (eight) hours as needed for nausea or vomiting. 06/19/14   Flint Melter, MD   BP 113/65  Pulse 120  Temp(Src) 98 F (36.7 C) (Oral)  Resp 23  Ht  (1.6 m)  Wt 103 lb (46.72 kg)  BMI 18.25 kg/m2  SpO2 99% Physical Exam  Nursing note and vitals reviewed. Constitutional: She is oriented to person, place, and time. She appears well-developed and well-nourished.  HENT:  Head: Normocephalic and atraumatic.  Eyes: Conjunctivae and EOM are normal. Pupils are equal, round, and reactive to light.  Neck: Normal range of motion and phonation normal. Neck supple.  Cardiovascular: Normal rate, regular rhythm and intact distal pulses.   Pulmonary/Chest: Effort normal and breath sounds normal. She exhibits no tenderness.  Abdominal: Soft. She exhibits no distension and no  mass. There is tenderness (diffuse, mild). There is no rebound and no guarding.  Hyperactive bowel sounds  Musculoskeletal: Normal range of motion.  Neurological: She is alert and oriented to person, place, and time. She exhibits normal muscle tone.  Skin: Skin is warm and dry.  Psychiatric: She has a normal mood and affect. Her behavior is normal. Judgment and thought content normal.    ED Course  Procedures (including critical care time)  Medications  0.9 %  sodium chloride infusion (0 mLs Intravenous Stopped 06/19/14 0944)  ondansetron (ZOFRAN) injection 4 mg (4 mg Intravenous Given 06/19/14 0825)  promethazine (PHENERGAN) injection 12.5 mg  (12.5 mg Intravenous Given 06/19/14 0909)  LORazepam (ATIVAN) injection 1 mg (1 mg Intravenous Given 06/19/14 0942)  HYDROmorphone (DILAUDID) injection 1 mg (1 mg Intravenous Given 06/19/14 0939)  prochlorperazine (COMPAZINE) injection 10 mg (10 mg Intravenous Given 06/19/14 1220)    Patient Vitals for the past 24 hrs:  BP Temp Temp src Pulse Resp SpO2 Height Weight  06/19/14 1245 113/65 mmHg - - - 23 - - -  06/19/14 1230 125/71 mmHg - - 120 - 99 % - -  06/19/14 1215 128/78 mmHg - - 115 - 96 % - -  06/19/14 1206 131/68 mmHg - - - 20 99 % - -  06/19/14 1200 131/68 mmHg - - 113 - 99 % - -  06/19/14 1145 137/91 mmHg - - 103 - 100 % - -  06/19/14 1130 143/79 mmHg - - 129 37 99 % - -  06/19/14 1118 126/83 mmHg - - 115 23 100 % - -  06/19/14 1015 115/70 mmHg - - 87 21 100 % - -  06/19/14 0949 111/55 mmHg - - 85 20 98 % - -  06/19/14 0945 111/55 mmHg - - 97 18 99 % - -  06/19/14 0915 113/66 mmHg - - 110 24 100 % - -  06/19/14 0900 127/71 mmHg - - 95 - 100 % - -  06/19/14 0801 125/89 mmHg 98 F (36.7 C) Oral 118 20 100 %  (1.6 m) 103 lb (46.72 kg)       Labs Review Labs Reviewed  CBC WITH DIFFERENTIAL - Abnormal; Notable for the following:    MCHC 36.9 (*)    All other components within normal limits  COMPREHENSIVE METABOLIC PANEL - Abnormal; Notable for the following:    Potassium 3.3 (*)    Glucose, Bld 102 (*)    Total Protein 8.4 (*)    Albumin 5.4 (*)    Anion gap 18 (*)    All other components within normal limits  URINALYSIS, ROUTINE W REFLEX MICROSCOPIC - Abnormal; Notable for the following:    Hgb urine dipstick MODERATE (*)    Ketones, ur 15 (*)    All other components within normal limits  LIPASE, BLOOD  URINE MICROSCOPIC-ADD ON  POC URINE PREG, ED    Imaging Review No results found.   EKG Interpretation None      MDM   Final diagnoses:  Nausea vomiting and diarrhea  Chronic pain  Anxiety    Recurrent sx, with clear drug-seeking behavior and  history. She has received multiple narcotic Rx, from multiple providers recently. There is no indication for further ED evaluation or Hospitalization.  Nursing Notes Reviewed/ Care Coordinated Applicable Imaging Reviewed Interpretation of Laboratory Data incorporated into ED treatment  The patient appears reasonably screened and/or stabilized for discharge and I doubt any other medical condition or other [facility] requiring  further screening, evaluation, or treatment in the ED at this time prior to discharge.  Plan: Home Medications- Zofran; Home Treatments- rest, gradually advance diet; return here if the recommended treatment, does not improve the symptoms; Recommended follow up- PCP asap to arrange f/u care    Flint Melter, MD 06/19/14 1757

## 2014-06-19 NOTE — ED Notes (Signed)
MD at bedside. 

## 2014-06-19 NOTE — Discharge Instructions (Signed)
Abdominal Pain Many things can cause abdominal pain. Usually, abdominal pain is not caused by a disease and will improve without treatment. It can often be observed and treated at home. Your health care provider will do a physical exam and possibly order blood tests and X-rays to help determine the seriousness of your pain. However, in many cases, more time must pass before a clear cause of the pain can be found. Before that point, your health care provider may not know if you need more testing or further treatment. HOME CARE INSTRUCTIONS  Monitor your abdominal pain for any changes. The following actions may help to alleviate any discomfort you are experiencing:  Only take over-the-counter or prescription medicines as directed by your health care provider.  Do not take laxatives unless directed to do so by your health care provider.  Try a clear liquid diet (broth, tea, or water) as directed by your health care provider. Slowly move to a bland diet as tolerated. SEEK MEDICAL CARE IF:  You have unexplained abdominal pain.  You have abdominal pain associated with nausea or diarrhea.  You have pain when you urinate or have a bowel movement.  You experience abdominal pain that wakes you in the night.  You have abdominal pain that is worsened or improved by eating food.  You have abdominal pain that is worsened with eating fatty foods.  You have a fever. SEEK IMMEDIATE MEDICAL CARE IF:   Your pain does not go away within 2 hours.  You keep throwing up (vomiting).  Your pain is felt only in portions of the abdomen, such as the right side or the left lower portion of the abdomen.  You pass bloody or black tarry stools. MAKE SURE YOU:  Understand these instructions.  Chronic Pain Chronic pain can be defined as pain that is off and on and lasts for 3-6 months or longer. Many things cause chronic pain, which can make it difficult to make a diagnosis. There are many treatment options  available for chronic pain. However, finding a treatment that works well for you may require trying various approaches until the right one is found. Many people benefit from a combination of two or more types of treatment to control their pain. SYMPTOMS  Chronic pain can occur anywhere in the body and can range from mild to very severe. Some types of chronic pain include: Headache. Low back pain. Cancer pain. Arthritis pain. Neurogenic pain. This is pain resulting from damage to nerves. People with chronic pain may also have other symptoms such as: Depression. Anger. Insomnia. Anxiety. DIAGNOSIS  Your health care provider will help diagnose your condition over time. In many cases, the initial focus will be on excluding possible conditions that could be causing the pain. Depending on your symptoms, your health care provider may order tests to diagnose your condition. Some of these tests may include:  Blood tests.  CT scan.  MRI.  X-rays.  Ultrasounds.  Nerve conduction studies.  You may need to see a specialist.  TREATMENT  Finding treatment that works well may take time. You may be referred to a pain specialist. He or she may prescribe medicine or therapies, such as:  Mindful meditation or yoga. Shots (injections) of numbing or pain-relieving medicines into the spine or area of pain. Local electrical stimulation. Acupuncture.  Massage therapy.  Aroma, color, light, or sound therapy.  Biofeedback.  Working with a physical therapist to keep from getting stiff.  Regular, gentle exercise.  Cognitive or  behavioral therapy.  Group support.  Sometimes, surgery may be recommended.  HOME CARE INSTRUCTIONS  Take all medicines as directed by your health care provider.  Lessen stress in your life by relaxing and doing things such as listening to calming music.  Exercise or be active as directed by your health care provider.  Eat a healthy diet and include things such as  vegetables, fruits, fish, and lean meats in your diet.  Keep all follow-up appointments with your health care provider.  Attend a support group with others suffering from chronic pain. SEEK MEDICAL CARE IF:  Your pain gets worse.  You develop a new pain that was not there before.  You cannot tolerate medicines given to you by your health care provider.  You have new symptoms since your last visit with your health care provider.  SEEK IMMEDIATE MEDICAL CARE IF:  You feel weak.  You have decreased sensation or numbness.  You lose control of bowel or bladder function.  Your pain suddenly gets much worse.  You develop shaking. You develop chills. You develop confusion. You develop chest pain. You develop shortness of breath.  MAKE SURE YOU: Understand these instructions. Will watch your condition. Will get help right away if you are not doing well or get worse. Document Released: 06/15/2002 Document Revised: 05/26/2013 Document Reviewed: 03/19/2013 [facility] Patient Information 2015 [place], [place]. This information is not intended to replace advice given to you by your health care provider. Make sure you discuss any questions you have with your health care provider.  Nausea and Vomiting Nausea is a sick feeling that often comes before throwing up (vomiting). Vomiting is a reflex where stomach contents come out of your mouth. Vomiting can cause severe loss of body fluids (dehydration). Children and elderly adults can become dehydrated quickly, especially if they also have diarrhea. Nausea and vomiting are symptoms of a condition or disease. It is important to find the cause of your symptoms. CAUSES  Direct irritation of the stomach lining. This irritation can result from increased acid production (gastroesophageal reflux disease), infection, food poisoning, taking certain medicines (such as nonsteroidal anti-inflammatory drugs), alcohol use, or tobacco use. Signals from the  brain.These signals could be caused by a headache, heat exposure, an inner ear disturbance, increased pressure in the brain from injury, infection, a tumor, or a concussion, pain, emotional stimulus, or metabolic problems. An obstruction in the gastrointestinal tract (bowel obstruction). Illnesses such as diabetes, hepatitis, gallbladder problems, appendicitis, kidney problems, cancer, sepsis, atypical symptoms of a heart attack, or eating disorders. Medical treatments such as chemotherapy and radiation. Receiving medicine that makes you sleep (general anesthetic) during surgery. DIAGNOSIS Your caregiver may ask for tests to be done if the problems do not improve after a few days. Tests may also be done if symptoms are severe or if the reason for the nausea and vomiting is not clear. Tests may include: Urine tests. Blood tests. Stool tests. Cultures (to look for evidence of infection). X-rays or other imaging studies. Test results can help your caregiver make decisions about treatment or the need for additional tests. TREATMENT You need to stay well hydrated. Drink frequently but in small amounts.You may wish to drink water, sports drinks, clear broth, or eat frozen ice pops or gelatin dessert to help stay hydrated.When you eat, eating slowly may help prevent nausea.There are also some antinausea medicines that may help prevent nausea. HOME CARE INSTRUCTIONS  Take all medicine as directed by your caregiver. If you do not have  an appetite, do not force yourself to eat. However, you must continue to drink fluids. If you have an appetite, eat a normal diet unless your caregiver tells you differently. Eat a variety of complex carbohydrates (rice, wheat, potatoes, bread), lean meats, yogurt, fruits, and vegetables. Avoid high-fat foods because they are more difficult to digest. Drink enough water and fluids to keep your urine clear or pale yellow. If you are dehydrated, ask your caregiver for  specific rehydration instructions. Signs of dehydration may include: Severe thirst. Dry lips and mouth. Dizziness. Dark urine. Decreasing urine frequency and amount. Confusion. Rapid breathing or pulse. SEEK IMMEDIATE MEDICAL CARE IF:  You have blood or brown flecks (like coffee grounds) in your vomit. You have black or bloody stools. You have a severe headache or stiff neck. You are confused. You have severe abdominal pain. You have chest pain or trouble breathing. You do not urinate at least once every 8 hours. You develop cold or clammy skin. You continue to vomit for longer than 24 to 48 hours. You have a fever. MAKE SURE YOU:  Understand these instructions. Will watch your condition. Will get help right away if you are not doing well or get worse. Document Released: 09/23/2005 Document Revised: 12/16/2011 Document Reviewed: 02/20/2011 [facility] Patient Information 2015 [place], [place]. This information is not intended to replace advice given to you by your health care provider. Make sure you discuss any questions you have with your health care provider.  Diarrhea Diarrhea is frequent loose and watery bowel movements. It can cause you to feel weak and dehydrated. Dehydration can cause you to become tired and thirsty, have a dry mouth, and have decreased urination that often is dark yellow. Diarrhea is a sign of another problem, most often an infection that will not last long. In most cases, diarrhea typically lasts 2-3 days. However, it can last longer if it is a sign of something more serious. It is important to treat your diarrhea as directed by your caregiver to lessen or prevent future episodes of diarrhea. CAUSES  Some common causes include: Gastrointestinal infections caused by viruses, bacteria, or parasites. Food poisoning or food allergies. Certain medicines, such as antibiotics, chemotherapy, and laxatives. Artificial sweeteners and fructose. Digestive disorders. HOME  CARE INSTRUCTIONS Ensure adequate fluid intake (hydration): Have 1 cup (8 oz) of fluid for each diarrhea episode. Avoid fluids that contain simple sugars or sports drinks, fruit juices, whole milk products, and sodas. Your urine should be clear or pale yellow if you are drinking enough fluids. Hydrate with an oral rehydration solution that you can purchase at pharmacies, retail stores, and online. You can prepare an oral rehydration solution at home by mixing the following ingredients together:  - tsp table salt.  tsp baking soda.  tsp salt substitute containing potassium chloride. 1  tablespoons sugar. 1 L (34 oz) of water. Certain foods and beverages may increase the speed at which food moves through the gastrointestinal (GI) tract. These foods and beverages should be avoided and include: Caffeinated and alcoholic beverages. High-fiber foods, such as raw fruits and vegetables, nuts, seeds, and whole grain breads and cereals. Foods and beverages sweetened with sugar alcohols, such as xylitol, sorbitol, and mannitol. Some foods may be well tolerated and may help thicken stool including: Starchy foods, such as rice, toast, pasta, low-sugar cereal, oatmeal, grits, baked potatoes, crackers, and bagels. Bananas. Applesauce. Add probiotic-rich foods to help increase healthy bacteria in the GI tract, such as yogurt and fermented milk products. Wash  your hands well after each diarrhea episode. Only take over-the-counter or prescription medicines as directed by your caregiver. Take a warm bath to relieve any burning or pain from frequent diarrhea episodes. SEEK IMMEDIATE MEDICAL CARE IF:  You are unable to keep fluids down. You have persistent vomiting. You have blood in your stool, or your stools are black and tarry. You do not urinate in 6-8 hours, or there is only a small amount of very dark urine. You have abdominal pain that increases or localizes. You have weakness, dizziness, confusion, or  light-headedness. You have a severe headache. Your diarrhea gets worse or does not get better. You have a fever or persistent symptoms for more than 2-3 days. You have a fever and your symptoms suddenly get worse. MAKE SURE YOU:  Understand these instructions. Will watch your condition. Will get help right away if you are not doing well or get worse. Document Released: 09/13/2002 Document Revised: 02/07/2014 Document Reviewed: 05/31/2012 [facility] Patient Information 2015 [place], [place]. This information is not intended to replace advice given to you by your health care provider. Make sure you discuss any questions you have with your health care provider.   Will watch your condition.   Will get help right away if you are not doing well or get worse.  Document Released: 07/03/2005 Document Revised: 09/28/2013 Document Reviewed: 06/02/2013 [facility] Patient Information 2015 [place], [place]. This information is not intended to replace advice given to you by your health care provider. Make sure you discuss any questions you have with your health care provider.

## 2014-06-23 ENCOUNTER — Emergency Department (HOSPITAL_COMMUNITY)
Admission: EM | Admit: 2014-06-23 | Discharge: 2014-06-23 | Discharge status: Against Medical Advice | Payer: 59 | Attending: Emergency Medicine | Admitting: Emergency Medicine

## 2014-06-23 ENCOUNTER — Encounter (HOSPITAL_COMMUNITY): Payer: Self-pay | Admitting: Emergency Medicine

## 2014-06-23 DIAGNOSIS — Z87891 Personal history of nicotine dependence: Secondary | ICD-10-CM | POA: Insufficient documentation

## 2014-06-23 DIAGNOSIS — M25579 Pain in unspecified ankle and joints of unspecified foot: Secondary | ICD-10-CM | POA: Insufficient documentation

## 2014-06-23 NOTE — ED Notes (Signed)
Pt has never arrived to treatment room, has been paged multiple times and has not reported to desk. Moving to [facility].

## 2014-06-23 NOTE — ED Notes (Addendum)
Pt c/o left ankle and foot pain that started today after she was tripped by her dog. Pt sts the pain increases with movement and when trying to put pressure on foot. Tender to touch. Good distal pulses. No obvious deformity seen. Nad, skin warm and dry, resp e/u.

## 2014-06-27 ENCOUNTER — Telehealth: Payer: Self-pay | Admitting: General Practice

## 2014-06-27 NOTE — Telephone Encounter (Signed)
Patient called and left message stating she has an appt with [place] on 11/2 and is having so many problems still and has been in and out of the hospital again. Patient states that she is doing physical therapy and going to her pain doctor but she is still in so much pain and is miserable and would like to know what else we can do like a hysterectomy or something but she is just tired of this.

## 2014-06-28 ENCOUNTER — Inpatient Hospital Stay (HOSPITAL_COMMUNITY)
Admission: AD | Admit: 2014-06-28 | Discharge: 2014-06-29 | Disposition: A | Discharge status: Home / Self Care | Payer: 59 | Source: Ambulatory Visit | Attending: Obstetrics & Gynecology | Admitting: Obstetrics & Gynecology

## 2014-06-28 DIAGNOSIS — R102 Pelvic and perineal pain: Secondary | ICD-10-CM

## 2014-06-28 DIAGNOSIS — N949 Unspecified condition associated with female genital organs and menstrual cycle: Secondary | ICD-10-CM | POA: Insufficient documentation

## 2014-06-28 DIAGNOSIS — Z87891 Personal history of nicotine dependence: Secondary | ICD-10-CM | POA: Insufficient documentation

## 2014-06-28 DIAGNOSIS — G8929 Other chronic pain: Secondary | ICD-10-CM | POA: Insufficient documentation

## 2014-06-28 NOTE — MAU Note (Signed)
Pt reports she has had a lot of problems with her female organs and has had multiple surgeries for endometriosis. States she cannot get any relief. Has appointment with Dr Debroah Loop on 11/02

## 2014-06-28 NOTE — Telephone Encounter (Signed)
Called Jenna Blake back, we discussed she will need to discuss her concerns and plan of care with Dr. Debroah Loop at her next visit since she is already going to the pain clinic and physical theraphy. Her appointment is 08/08/14 but we discussed she can call daily if she wants to and if Dr. Debroah Loop has a cancellation she may be able to come in sooner. She voices understanding.

## 2014-06-29 ENCOUNTER — Ambulatory Visit (INDEPENDENT_AMBULATORY_CARE_PROVIDER_SITE_OTHER): Payer: 59 | Admitting: Obstetrics and Gynecology

## 2014-06-29 ENCOUNTER — Encounter (HOSPITAL_COMMUNITY): Payer: Self-pay

## 2014-06-29 VITALS — BP 127/71 | HR 87 | Ht 63.0 in | Wt 105.1 lb

## 2014-06-29 DIAGNOSIS — N949 Unspecified condition associated with female genital organs and menstrual cycle: Secondary | ICD-10-CM | POA: Diagnosis present

## 2014-06-29 DIAGNOSIS — G8929 Other chronic pain: Secondary | ICD-10-CM

## 2014-06-29 DIAGNOSIS — R102 Pelvic and perineal pain: Principal | ICD-10-CM

## 2014-06-29 DIAGNOSIS — Z87891 Personal history of nicotine dependence: Secondary | ICD-10-CM | POA: Diagnosis not present

## 2014-06-29 LAB — POCT PREGNANCY, URINE: PREG TEST UR: NEGATIVE

## 2014-06-29 LAB — URINALYSIS, ROUTINE W REFLEX MICROSCOPIC
BILIRUBIN URINE: NEGATIVE
Glucose, UA: NEGATIVE mg/dL
Hgb urine dipstick: NEGATIVE
KETONES UR: NEGATIVE mg/dL
Nitrite: NEGATIVE
Protein, ur: NEGATIVE mg/dL
Specific Gravity, Urine: 1.02 (ref 1.005–1.030)
UROBILINOGEN UA: 0.2 mg/dL (ref 0.0–1.0)
pH: 6 (ref 5.0–8.0)

## 2014-06-29 LAB — URINE MICROSCOPIC-ADD ON

## 2014-06-29 LAB — RAPID URINE DRUG SCREEN, HOSP PERFORMED
Amphetamines: NOT DETECTED
BARBITURATES: NOT DETECTED
Benzodiazepines: NOT DETECTED
Cocaine: NOT DETECTED
Opiates: POSITIVE — AB
TETRAHYDROCANNABINOL: NOT DETECTED

## 2014-06-29 MED ORDER — HYDROMORPHONE HCL 1 MG/ML IJ SOLN
1.0000 mg | Freq: Once | INTRAMUSCULAR | Status: AC
  Administered 2014-06-29: 1 mg via INTRAMUSCULAR
  Filled 2014-06-29: qty 1

## 2014-06-29 MED ORDER — HYDROMORPHONE HCL 1 MG/ML IJ SOLN: 1.0000 mg | Freq: Once | INTRAMUSCULAR | Status: DC

## 2014-06-29 MED ORDER — KETOROLAC TROMETHAMINE 60 MG/2ML IM SOLN
60.0000 mg | Freq: Once | INTRAMUSCULAR | Status: AC
  Administered 2014-06-29: 60 mg via INTRAMUSCULAR
  Filled 2014-06-29: qty 2

## 2014-06-29 NOTE — Discharge Instructions (Signed)
Pelvic Pain Pelvic pain is pain felt below the belly button and between your hips. It can be caused by many different things. It is important to get help right away. This is especially true for severe, sharp, or unusual pain that comes on suddenly.  HOME CARE  Only take medicine as told by your doctor.  Rest as told by your doctor.  Eat a healthy diet, such as fruits, vegetables, and lean meats.  Drink enough fluids to keep your pee (urine) clear or pale yellow, or as told.  Avoid sex (intercourse) if it causes pain.  Apply warm or cold packs to your lower belly (abdomen). Use the type of pack that helps the pain.  Avoid situations that cause you stress.  Keep a journal to track your pain. Write down:  When the pain started.  Where it is located.  If there are things that seem to be related to the pain, such as food or your period.  Follow up with your doctor as told. GET HELP RIGHT AWAY IF:   You have heavy bleeding from the vagina.  You have more pelvic pain.  You feel lightheaded or pass out (faint).  You have chills.  You have pain when you pee or have blood in your pee.  You cannot stop having watery poop (diarrhea).  You cannot stop throwing up (vomiting).  You have a fever or lasting symptoms for more than 3 days.  You have a fever and your symptoms suddenly get worse.  You are being physically or sexually abused.  Your medicine does not help your pain.  You have fluid (discharge) coming from your vagina that is not normal. MAKE SURE YOU:  Understand these instructions.  Will watch your condition.  Will get help if you are not doing well or get worse. Document Released: 03/11/2008 Document Revised: 03/24/2012 Document Reviewed: 01/13/2012 [employer] Patient Information 2015 [employer]. This information is not intended to replace advice given to you by your health care provider. Make sure you discuss any questions you have with your health care  provider.  

## 2014-06-29 NOTE — Progress Notes (Signed)
Patient ID: Jenna Blake, female   DOB: [birth year], 23 y.o.   MRN: [id number] 23 yo G0 here for evaluation of chronic pelvic pain. Patient was seen in MAU at midnight today for the same complaints. She is scheduled to follow up with Dr. Debroah Loop in November and has a follow up with [facility] pain clinic in October. Patient is already receivin Depo-Lupron and is due for her next dose in October. Patient reports pain is at best 7/10 and increases to 10/10 at its worst. Patient has only had 2 sessions of pelvic PT and has discontinued treatment Advised to keep all appointments, particularly with pain clinic. RTC with Dr. Debroah Loop Emotional support provided

## 2014-06-29 NOTE — MAU Note (Signed)
Pt has c/o pelvic pain for " awhile now." states SOB with pain. Cannot get relief with any medications. Took tylenol and ibuprofen before she came in.

## 2014-06-29 NOTE — MAU Provider Note (Signed)
History     CSN: [id number]  Arrival date and time: 06/28/14 2130   First Provider Initiated Contact with Patient 06/29/14 0009      Chief Complaint  Patient presents with  . Abdominal Pain   HPI Jenna Blake is a 23 y.o. [id number] who presents to MAU today with complaint of pelvic pain. The patient states chronic pelvic pain issues. She states that she is followed by Dr. Debroah Loop in [facility]. She is also a patient of [facility]. She states that she was referred to the [facility] clinic for management of chronic pelvic pain. She states that when she saw them, they did not give her any pain medication and only referred to her physical therapy. She states that she has been to 2 PT appointments and feels that pain is getting worse. She has an appointment for follow-up with Dr. Debroah Loop on 08/08/14. She has had numerous refills on Tramadol prior to [facility] clinic appointment and has been told that she will not get more because they should be managing her pain. She states that she is only taking Ibuprofen and Tylenol at this time with no relief. Last dose around 2100. She denies vaginal bleeding, discharge, dysuria or hematuria today. Patient was seen at [facility] and had CT of abdomen and pelvis last month that showed no acute abnormalities of the GYN organs. Patient is currently receiving Depo Lupron for management of her pain from the [facility].   OB History   Grav Para Term Preterm Abortions TAB SAB Ect Mult Living        Past Medical History  Diagnosis Date  . Endometriosis   . Depression     Diagnosed at age 42  . Anxiety   . Ovarian cyst   . Mononucleosis   . Renal disorder     Past Surgical History  Procedure Laterality Date  . Cholecystectomy    . Tonsillectomy    . Laparoscopic endometriosis fulguration    . Laparoscopy N/A 01/04/2014    Procedure: LAPAROSCOPY DIAGNOSTIC;  Surgeon: Adam Phenix, MD;  Location: [facility];  Service: Gynecology;  Laterality: N/A;     Family History  Problem Relation Age of Onset  . Hypertension Father   . Diabetes Maternal Grandmother     History  Substance Use Topics  . Smoking status: Former Smoker -- 0.50 packs/day    Types: Cigarettes    Quit date: 03/19/2014  . Smokeless tobacco: Never Used  . Alcohol Use: No     Comment: Former user    Allergies:  Allergies  Allergen Reactions  . Betadine [Povidone Iodine] Hives  . Reglan [Metoclopramide] Other (See Comments)    "bad thoughts, makes me feel weird."  . Amoxicillin Rash    No prescriptions prior to admission    Review of Systems  Constitutional: Negative for fever and malaise/fatigue.  Gastrointestinal: Positive for abdominal pain. Negative for nausea, vomiting, diarrhea and constipation.  Genitourinary: Negative for dysuria, urgency, frequency and hematuria.       Neg - vaginal bleeding, discharge   Physical Exam   Blood pressure 119/62, pulse 78, temperature 97.8 F (36.6 C), temperature source Oral, resp. rate 18, height  (1.6 m), weight 108 lb (48.988 kg), SpO2 99.00%.  Physical Exam  Constitutional: She is oriented to person, place, and time. She appears well-developed and well-nourished. No distress.  HENT:  Head: Normocephalic.  Cardiovascular: Normal rate.   Respiratory:  Effort normal.  GI: Soft. Bowel sounds are normal. She exhibits no distension and no mass. There is tenderness (modeate diffuse tenderness to palpation of the abdomen worse in the suprapubic region). There is no rebound and no guarding.  Neurological: She is alert and oriented to person, place, and time.  Skin: Skin is warm and dry. No erythema.  Psychiatric: She has a normal mood and affect.   Results for orders placed during the hospital encounter of 06/28/14 (from the past 24 hour(s))  URINALYSIS, ROUTINE W REFLEX MICROSCOPIC     Status: Abnormal   Collection Time    06/29/14 12:30 AM      Result Value Ref Range   Color, Urine YELLOW  YELLOW    APPearance CLEAR  CLEAR   Specific Gravity, Urine 1.020  1.005 - 1.030   pH 6.0  5.0 - 8.0   Glucose, UA NEGATIVE  NEGATIVE mg/dL   Hgb urine dipstick NEGATIVE  NEGATIVE   Bilirubin Urine NEGATIVE  NEGATIVE   Ketones, ur NEGATIVE  NEGATIVE mg/dL   Protein, ur NEGATIVE  NEGATIVE mg/dL   Urobilinogen, UA 0.2  0.0 - 1.0 mg/dL   Nitrite NEGATIVE  NEGATIVE   Leukocytes, UA TRACE (*) NEGATIVE  URINE MICROSCOPIC-ADD ON     Status: Abnormal   Collection Time    06/29/14 12:30 AM      Result Value Ref Range   Squamous Epithelial / LPF RARE  RARE   WBC, UA 3-6  <3 WBC/hpf   RBC / HPF 3-6  <3 RBC/hpf   Bacteria, UA FEW (*) RARE  POCT PREGNANCY, URINE     Status: None   Collection Time    06/29/14 12:40 AM      Result Value Ref Range   Preg Test, Ur NEGATIVE  NEGATIVE    MAU Course  Procedures None  MDM UPT - negative UA and UDS today 60 mg Toradol IM given in MAU - patient denies any relief 1 mg Dilaudid IM given in MAU - patient states significant relief Discussed patient with Dr. Debroah Loop. States that since patient is seen by [facility] pain clinic she will need to follow-up with them for pain management. Also, she can follow-up with [facility] ASAP.   Assessment and Plan  A: Chronic pelvic pain  P: Discharge home UDS pending Patient given Urgent appointment in [facility] for 06/29/14 @ 2:00 pm. Patient states that she would really rather see Dr. Debroah Loop. I have explained that this is not possible right now and she needs to be seen. Patient voiced understanding.  Patient advised to return to [facility] pelvic pain clinic for management of pain Patient may return to MAU as needed or if her condition were to change or worsen   Marny Lowenstein, PA-C  06/29/2014, 3:03 AM

## 2014-06-29 NOTE — Progress Notes (Signed)
Patient reports that she has severe pain. She is miserable and is to the point that she wants a hysterectomy. Went to MAU yesterday. Patient went to pain clinic and they sent her to PT and wasn't given any pain medication. Pain reports that her pain is always a 7. It is sometimes as bad as 10. Never better than 7.

## 2014-07-03 ENCOUNTER — Encounter (HOSPITAL_COMMUNITY): Payer: Self-pay | Admitting: Emergency Medicine

## 2014-07-03 ENCOUNTER — Emergency Department (HOSPITAL_COMMUNITY): Payer: 59

## 2014-07-03 ENCOUNTER — Inpatient Hospital Stay (HOSPITAL_COMMUNITY)
Admission: EM | Admit: 2014-07-03 | Discharge: 2014-07-03 | Discharge status: Against Medical Advice | Payer: 59 | Attending: Obstetrics and Gynecology | Admitting: Obstetrics and Gynecology

## 2014-07-03 DIAGNOSIS — Z87891 Personal history of nicotine dependence: Secondary | ICD-10-CM | POA: Insufficient documentation

## 2014-07-03 DIAGNOSIS — R102 Pelvic and perineal pain: Secondary | ICD-10-CM

## 2014-07-03 DIAGNOSIS — G8929 Other chronic pain: Secondary | ICD-10-CM | POA: Diagnosis not present

## 2014-07-03 DIAGNOSIS — N949 Unspecified condition associated with female genital organs and menstrual cycle: Secondary | ICD-10-CM | POA: Insufficient documentation

## 2014-07-03 HISTORY — DX: Calculus of kidney: N20.0

## 2014-07-03 LAB — COMPREHENSIVE METABOLIC PANEL
ALT: 16 U/L (ref 0–35)
AST: 19 U/L (ref 0–37)
Albumin: 4.3 g/dL (ref 3.5–5.2)
Alkaline Phosphatase: 80 U/L (ref 39–117)
Anion gap: 13 (ref 5–15)
BUN: 12 mg/dL (ref 6–23)
CO2: 26 mEq/L (ref 19–32)
CREATININE: 0.63 mg/dL (ref 0.50–1.10)
Calcium: 9.6 mg/dL (ref 8.4–10.5)
Chloride: 101 mEq/L (ref 96–112)
GFR calc Af Amer: >90 mL/min (ref >90)
GFR calc non Af Amer: >90 mL/min (ref >90)
GLUCOSE: 93 mg/dL (ref 70–99)
Potassium: 3.7 mEq/L (ref 3.7–5.3)
Sodium: 140 mEq/L (ref 137–147)
TOTAL PROTEIN: 7.1 g/dL (ref 6.0–8.3)
Total Bilirubin: 0.4 mg/dL (ref 0.3–1.2)

## 2014-07-03 LAB — URINALYSIS, ROUTINE W REFLEX MICROSCOPIC
Bilirubin Urine: NEGATIVE
Glucose, UA: NEGATIVE mg/dL
Hgb urine dipstick: NEGATIVE
Ketones, ur: NEGATIVE mg/dL
Leukocytes, UA: NEGATIVE
Nitrite: NEGATIVE
Protein, ur: NEGATIVE mg/dL
Specific Gravity, Urine: 1.018 (ref 1.005–1.030)
Urobilinogen, UA: 1 mg/dL (ref 0.0–1.0)
pH: 7 (ref 5.0–8.0)

## 2014-07-03 LAB — I-STAT CHEM 8, ED
BUN: 11 mg/dL (ref 6–23)
CALCIUM ION: 1.16 mmol/L (ref 1.12–1.23)
Chloride: 103 mEq/L (ref 96–112)
Creatinine, Ser: 0.7 mg/dL (ref 0.50–1.10)
Glucose, Bld: 92 mg/dL (ref 70–99)
HCT: 39 % (ref 36.0–46.0)
HEMOGLOBIN: 13.3 g/dL (ref 12.0–15.0)
Potassium: 3.6 mEq/L — ABNORMAL LOW (ref 3.7–5.3)
Sodium: 142 mEq/L (ref 137–147)
TCO2: 27 mmol/L (ref 0–100)

## 2014-07-03 LAB — CBC WITH DIFFERENTIAL/PLATELET
Basophils Absolute: 0 10*3/uL (ref 0.0–0.1)
Basophils Relative: 0 % (ref 0–1)
Eosinophils Absolute: 0 10*3/uL (ref 0.0–0.7)
Eosinophils Relative: 1 % (ref 0–5)
HCT: 36.6 % (ref 36.0–46.0)
Hemoglobin: 13 g/dL (ref 12.0–15.0)
Lymphocytes Relative: 44 % (ref 12–46)
Lymphs Abs: 2.4 10*3/uL (ref 0.7–4.0)
MCH: 33 pg (ref 26.0–34.0)
MCHC: 35.5 g/dL (ref 30.0–36.0)
MCV: 92.9 fL (ref 78.0–100.0)
Monocytes Absolute: 0.6 10*3/uL (ref 0.1–1.0)
Monocytes Relative: 11 % (ref 3–12)
Neutro Abs: 2.4 10*3/uL (ref 1.7–7.7)
Neutrophils Relative %: 44 % (ref 43–77)
Platelets: 198 10*3/uL (ref 150–400)
RBC: 3.94 MIL/uL (ref 3.87–5.11)
RDW: 12.1 % (ref 11.5–15.5)
WBC: 5.4 10*3/uL (ref 4.0–10.5)

## 2014-07-03 LAB — URINE MICROSCOPIC-ADD ON

## 2014-07-03 LAB — WET PREP, GENITAL
CLUE CELLS WET PREP: NONE SEEN
Trich, Wet Prep: NONE SEEN
YEAST WET PREP: NONE SEEN

## 2014-07-03 LAB — POC URINE PREG, ED: Preg Test, Ur: NEGATIVE

## 2014-07-03 LAB — LIPASE, BLOOD: Lipase: 14 U/L (ref 11–59)

## 2014-07-03 MED ORDER — CYCLOBENZAPRINE HCL 10 MG PO TABS
10.0000 mg | ORAL_TABLET | Freq: Once | ORAL | Status: AC
  Administered 2014-07-03: 10 mg via ORAL
  Filled 2014-07-03: qty 1

## 2014-07-03 MED ORDER — MORPHINE SULFATE 4 MG/ML IJ SOLN
4.0000 mg | Freq: Once | INTRAMUSCULAR | Status: AC
  Administered 2014-07-03: 4 mg via INTRAVENOUS
  Filled 2014-07-03: qty 1

## 2014-07-03 MED ORDER — HYDROMORPHONE HCL 1 MG/ML IJ SOLN
1.0000 mg | Freq: Once | INTRAMUSCULAR | Status: AC
  Administered 2014-07-03: 1 mg via INTRAVENOUS
  Filled 2014-07-03: qty 1

## 2014-07-03 MED ORDER — MORPHINE SULFATE 4 MG/ML IJ SOLN
6.0000 mg | Freq: Once | INTRAMUSCULAR | Status: AC
  Administered 2014-07-03: 6 mg via INTRAVENOUS
  Filled 2014-07-03: qty 2

## 2014-07-03 NOTE — MAU Note (Signed)
Pt walked out of room, out the back door of MAU and out the front door into a car as seen on surveillance camera. Saline lock appears to have been removed by patient as it is sitting in the trash can in patient's room.

## 2014-07-03 NOTE — ED Provider Notes (Addendum)
CSN: [id number]     Arrival date & time 07/03/14  1729 History   First MD Initiated Contact with Patient 07/03/14 1732     Chief Complaint  Patient presents with  . Abdominal Pain     (Consider location/radiation/quality/duration/timing/severity/associated sxs/prior Treatment) HPI Complains of lower abdominal pain at suprapubic area and right lower quadrant onset gradually 3 days ago. Pain is constant worse with walking, severe. Improved with lying in fetal position. She's vomited 3 times today prior to coming here. No treatment prior to coming here. No nausea present. No vaginal discharge. No urinary symptoms. Feels like ovarian cyst or kidney stone she's had in the past. No other associated symptoms Past Medical History  Diagnosis Date  . Endometriosis   . Depression     Diagnosed at age 49  . Anxiety   . Ovarian cyst   . Mononucleosis   . Renal disorder   . Kidney stone    Past Surgical History  Procedure Laterality Date  . Cholecystectomy    . Tonsillectomy    . Laparoscopic endometriosis fulguration    . Laparoscopy N/A 01/04/2014    Procedure: LAPAROSCOPY DIAGNOSTIC;  Surgeon: Adam Phenix, MD;  Location: [facility];  Service: Gynecology;  Laterality: N/A;   Family History  Problem Relation Age of Onset  . Hypertension Father   . Diabetes Maternal Grandmother    History  Substance Use Topics  . Smoking status: Former Smoker -- 0.50 packs/day    Types: Cigarettes    Quit date: 03/19/2014  . Smokeless tobacco: Never Used  . Alcohol Use: No     Comment: Former user   OB History   Grav Para Term Preterm Abortions TAB SAB Ect Mult Living       Review of Systems  Constitutional: Negative.   HENT: Negative.   Respiratory: Negative.   Cardiovascular: Negative.   Gastrointestinal: Positive for vomiting and abdominal pain.  Genitourinary: Positive for dyspareunia.       Amenorrhea  Musculoskeletal: Negative.   Skin: Negative.   Neurological:  Negative.   Psychiatric/Behavioral: Negative.   All other systems reviewed and are negative.     Allergies  Betadine; Reglan; and Amoxicillin  Home Medications   Prior to Admission medications   Medication Sig Start Date End Date Taking? Authorizing Provider  acetaminophen (TYLENOL) 500 MG tablet Take 1,000 mg by mouth every 6 (six) hours as needed for mild pain.    Historical Provider, MD  ibuprofen (ADVIL,MOTRIN) 200 MG tablet Take 600 mg by mouth every 6 (six) hours as needed.    Historical Provider, MD  leuprolide (LUPRON DEPOT) 11.25 MG injection Inject 11.25 mg into the muscle every 3 (three) months.     Historical Provider, MD  ondansetron (ZOFRAN) 8 MG tablet Take 1 tablet (8 mg total) by mouth every 8 (eight) hours as needed for nausea or vomiting. 06/19/14   Flint Melter, MD  promethazine (PHENERGAN) 25 MG tablet Take 25 mg by mouth every 6 (six) hours as needed for nausea or vomiting.    Historical Provider, MD   BP 134/76  Pulse 98  Temp(Src) 97.3 F (36.3 C) (Oral)  SpO2 99% Physical Exam  Nursing note and vitals reviewed. Constitutional: She appears well-developed and well-nourished. She appears distressed.  Appears uncomfortable  HENT:  Head: Normocephalic and atraumatic.  Eyes: Conjunctivae are normal. Pupils are equal, round, and reactive to light.  Neck: Neck supple. No tracheal  deviation present. No thyromegaly present.  Cardiovascular: Normal rate and regular rhythm.   No murmur heard. Pulmonary/Chest: Effort normal and breath sounds normal.  Abdominal: Soft. Bowel sounds are normal. She exhibits no distension. There is tenderness.  Tender suprapubic area and right lower quadrant  Genitourinary:  No flank tenderness.Vagina no external lesion. Copious white discharge. Cervical os closed. No bleeding. Positive cervical motion tenderness positive right adnexal tenderness  Musculoskeletal: Normal range of motion. She exhibits no edema and no tenderness.   Neurological: She is alert. Coordination normal.  Skin: Skin is warm and dry. No rash noted.  Psychiatric: She has a normal mood and affect.    ED Course  Procedures (including critical care time) Labs Review Labs Reviewed  CBC WITH DIFFERENTIAL  COMPREHENSIVE METABOLIC PANEL  LIPASE, BLOOD  URINALYSIS, ROUTINE W REFLEX MICROSCOPIC  POC URINE PREG, ED    Imaging Review No results found.   EKG Interpretation None     7:50 PM requesting more pain medicine. Appears uncomfortable. Additional morphine ordered. She reports that her nausea is controlled at present. 8:30 PM still with continued pain however improved  Results for orders placed during the hospital encounter of 07/03/14  CBC WITH DIFFERENTIAL      Result Value Ref Range   WBC 5.4  4.0 - 10.5 K/uL   RBC 3.94  3.87 - 5.11 MIL/uL   Hemoglobin 13.0  12.0 - 15.0 g/dL   HCT 16.1  09.6 - 04.5 %   MCV 92.9  78.0 - 100.0 fL   MCH 33.0  26.0 - 34.0 pg   MCHC 35.5  30.0 - 36.0 g/dL   RDW 40.9  81.1 - 91.4 %   Platelets 198  150 - 400 K/uL   Neutrophils Relative % 44  43 - 77 %   Neutro Abs 2.4  1.7 - 7.7 K/uL   Lymphocytes Relative 44  12 - 46 %   Lymphs Abs 2.4  0.7 - 4.0 K/uL   Monocytes Relative 11  3 - 12 %   Monocytes Absolute 0.6  0.1 - 1.0 K/uL   Eosinophils Relative 1  0 - 5 %   Eosinophils Absolute 0.0  0.0 - 0.7 K/uL   Basophils Relative 0  0 - 1 %   Basophils Absolute 0.0  0.0 - 0.1 K/uL  COMPREHENSIVE METABOLIC PANEL      Result Value Ref Range   Sodium 140  137 - 147 mEq/L   Potassium 3.7  3.7 - 5.3 mEq/L   Chloride 101  96 - 112 mEq/L   CO2 26  19 - 32 mEq/L   Glucose, Bld 93  70 - 99 mg/dL   BUN 12  6 - 23 mg/dL   Creatinine, Ser 7.82  0.50 - 1.10 mg/dL   Calcium 9.6  8.4 - 95.6 mg/dL   Total Protein 7.1  6.0 - 8.3 g/dL   Albumin 4.3  3.5 - 5.2 g/dL   AST 19  0 - 37 U/L   ALT 16  0 - 35 U/L   Alkaline Phosphatase 80  39 - 117 U/L   Total Bilirubin 0.4  0.3 - 1.2 mg/dL   GFR calc non Af Amer  >90  >90 mL/min   GFR calc Af Amer >90  >90 mL/min   Anion gap 13  5 - 15  LIPASE, BLOOD      Result Value Ref Range   Lipase 14  11 - 59 U/L  URINALYSIS, ROUTINE W REFLEX  MICROSCOPIC      Result Value Ref Range   Color, Urine YELLOW  YELLOW   APPearance TURBID (*) CLEAR   Specific Gravity, Urine 1.018  1.005 - 1.030   pH 7.0  5.0 - 8.0   Glucose, UA NEGATIVE  NEGATIVE mg/dL   Hgb urine dipstick NEGATIVE  NEGATIVE   Bilirubin Urine NEGATIVE  NEGATIVE   Ketones, ur NEGATIVE  NEGATIVE mg/dL   Protein, ur NEGATIVE  NEGATIVE mg/dL   Urobilinogen, UA 1.0  0.0 - 1.0 mg/dL   Nitrite NEGATIVE  NEGATIVE   Leukocytes, UA NEGATIVE  NEGATIVE  URINE MICROSCOPIC-ADD ON      Result Value Ref Range   Squamous Epithelial / LPF FEW (*) RARE   WBC, UA 3-6  <3 WBC/hpf   RBC / HPF 0-2  <3 RBC/hpf   Bacteria, UA MANY (*) RARE   Urine-Other MUCOUS PRESENT    POC URINE PREG, ED      Result Value Ref Range   Preg Test, Ur NEGATIVE  NEGATIVE  I-STAT CHEM 8, ED      Result Value Ref Range   Sodium 142  137 - 147 mEq/L   Potassium 3.6 (*) 3.7 - 5.3 mEq/L   Chloride 103  96 - 112 mEq/L   BUN 11  6 - 23 mg/dL   Creatinine, Ser 1.61  0.50 - 1.10 mg/dL   Glucose, Bld 92  70 - 99 mg/dL   Calcium, Ion 0.96  0.45 - 1.23 mmol/L   TCO2 27  0 - 100 mmol/L   Hemoglobin 13.3  12.0 - 15.0 g/dL   HCT 40.9  81.1 - 91.4 %   US Transvaginal Non-ob  07/03/2014   CLINICAL DATA:  Right lower quadrant pain. History of cysts and endometriosis. LMP 02/13/2014. Gravida 0.  EXAM: TRANSABDOMINAL AND TRANSVAGINAL ULTRASOUND OF PELVIS  DOPPLER ULTRASOUND OF OVARIES  TECHNIQUE: Both transabdominal and transvaginal ultrasound examinations of the pelvis were performed. Transabdominal technique was performed for global imaging of the pelvis including uterus, ovaries, adnexal regions, and pelvic cul-de-sac.  It was necessary to proceed with endovaginal exam following the transabdominal exam to visualize the uterus, endometrium, and  ovaries. Color and duplex Doppler ultrasound was utilized to evaluate blood flow to the ovaries.  COMPARISON:  05/16/2014  FINDINGS: Uterus  Measurements: 5.4 x 1.8 x 2.9 cm. No fibroids or other mass visualized.  Endometrium  Thickness: 3.4 mm.  No focal abnormality visualized.  Right ovary  Measurements: 3.1 x 2.0 x 2.3 cm. Normal appearance/no adnexal mass.  Left ovary  Measurements: 2.0 x 1.1 x 1.3 cm. Normal appearance/no adnexal mass.  Pulsed Doppler evaluation of both ovaries demonstrates normal low-resistance arterial and venous waveforms.  Other findings  Small amount of free pelvic fluid is identified primarily in the right adnexal region.  IMPRESSION: 1. Normal appearance of the uterus and ovaries. 2. No evidence for adnexal mass or ovarian torsion. 3. Small amount of free pelvic fluid.   Electronically Signed   By: Rosalie Gums M.D.   On: 07/03/2014 19:17   US Pelvis Complete  07/03/2014   CLINICAL DATA:  Right lower quadrant pain. History of cysts and endometriosis. LMP 02/13/2014. Gravida 0.  EXAM: TRANSABDOMINAL AND TRANSVAGINAL ULTRASOUND OF PELVIS  DOPPLER ULTRASOUND OF OVARIES  TECHNIQUE: Both transabdominal and transvaginal ultrasound examinations of the pelvis were performed. Transabdominal technique was performed for global imaging of the pelvis including uterus, ovaries, adnexal regions, and pelvic cul-de-sac.  It was necessary to proceed  with endovaginal exam following the transabdominal exam to visualize the uterus, endometrium, and ovaries. Color and duplex Doppler ultrasound was utilized to evaluate blood flow to the ovaries.  COMPARISON:  05/16/2014  FINDINGS: Uterus  Measurements: 5.4 x 1.8 x 2.9 cm. No fibroids or other mass visualized.  Endometrium  Thickness: 3.4 mm.  No focal abnormality visualized.  Right ovary  Measurements: 3.1 x 2.0 x 2.3 cm. Normal appearance/no adnexal mass.  Left ovary  Measurements: 2.0 x 1.1 x 1.3 cm. Normal appearance/no adnexal mass.  Pulsed Doppler  evaluation of both ovaries demonstrates normal low-resistance arterial and venous waveforms.  Other findings  Small amount of free pelvic fluid is identified primarily in the right adnexal region.  IMPRESSION: 1. Normal appearance of the uterus and ovaries. 2. No evidence for adnexal mass or ovarian torsion. 3. Small amount of free pelvic fluid.   Electronically Signed   By: Rosalie Gums M.D.   On: 07/03/2014 19:17   [place] Art/ven Flow Abd Pelv Doppler  07/03/2014   CLINICAL DATA:  Right lower quadrant pain. History of cysts and endometriosis. LMP 02/13/2014. Gravida 0.  EXAM: TRANSABDOMINAL AND TRANSVAGINAL ULTRASOUND OF PELVIS  DOPPLER ULTRASOUND OF OVARIES  TECHNIQUE: Both transabdominal and transvaginal ultrasound examinations of the pelvis were performed. Transabdominal technique was performed for global imaging of the pelvis including uterus, ovaries, adnexal regions, and pelvic cul-de-sac.  It was necessary to proceed with endovaginal exam following the transabdominal exam to visualize the uterus, endometrium, and ovaries. Color and duplex Doppler ultrasound was utilized to evaluate blood flow to the ovaries.  COMPARISON:  05/16/2014  FINDINGS: Uterus  Measurements: 5.4 x 1.8 x 2.9 cm. No fibroids or other mass visualized.  Endometrium  Thickness: 3.4 mm.  No focal abnormality visualized.  Right ovary  Measurements: 3.1 x 2.0 x 2.3 cm. Normal appearance/no adnexal mass.  Left ovary  Measurements: 2.0 x 1.1 x 1.3 cm. Normal appearance/no adnexal mass.  Pulsed Doppler evaluation of both ovaries demonstrates normal low-resistance arterial and venous waveforms.  Other findings  Small amount of free pelvic fluid is identified primarily in the right adnexal region.  IMPRESSION: 1. Normal appearance of the uterus and ovaries. 2. No evidence for adnexal mass or ovarian torsion. 3. Small amount of free pelvic fluid.   Electronically Signed   By: Rosalie Gums M.D.   On: 07/03/2014 19:17      MDM   Final diagnoses:   None  Case discussed with Dr. Emelda Fear  [facility], pain management and further treatment if needed   diagnosis pelvic pain     Doug Sou, MD 07/03/14 2038  Doug Sou, MD 07/03/14 2053

## 2014-07-03 NOTE — MAU Provider Note (Signed)
History     CSN: [id number]  Arrival date and time: 07/03/14 1729   First Provider Initiated Contact with Patient 07/03/14 2233      Chief Complaint  Patient presents with  . Abdominal Pain   Abdominal Pain    Jenna Blake is a 23 y.o. [id number] who was sent over via carelink from [facility]. She is having an exacerbation of her chronic pelvic pain. She has had Morphine and dilaudid at the [facility]. She states that they have helped her pain some. She has an appointment at [facility] on 07/12/14. She states that she has only been able to go to one PT session because she has so much pain. She states that she just can't get out of bed most days, and feels like "no one can help her or understand her pain".   Past Medical History  Diagnosis Date  . Endometriosis   . Depression     Diagnosed at age 32  . Anxiety   . Ovarian cyst   . Mononucleosis   . Renal disorder   . Kidney stone     Past Surgical History  Procedure Laterality Date  . Cholecystectomy    . Tonsillectomy    . Laparoscopic endometriosis fulguration    . Laparoscopy N/A 01/04/2014    Procedure: LAPAROSCOPY DIAGNOSTIC;  Surgeon: Adam Phenix, MD;  Location: [facility];  Service: Gynecology;  Laterality: N/A;    Family History  Problem Relation Age of Onset  . Hypertension Father   . Diabetes Maternal Grandmother     History  Substance Use Topics  . Smoking status: Former Smoker -- 0.50 packs/day    Types: Cigarettes    Quit date: 03/19/2014  . Smokeless tobacco: Never Used  . Alcohol Use: No     Comment: Former user    Allergies:  Allergies  Allergen Reactions  . Betadine [Povidone Iodine] Hives  . Reglan [Metoclopramide] Other (See Comments)    "bad thoughts, makes me feel weird."  . Amoxicillin Rash    Prescriptions prior to admission  Medication Sig Dispense Refill  . acetaminophen (TYLENOL) 500 MG tablet Take 1,000 mg by mouth every 6 (six) hours as needed for mild pain.      Marland Kitchen ibuprofen (ADVIL,MOTRIN) 200  MG tablet Take 600 mg by mouth every 6 (six) hours as needed.      Marland Kitchen leuprolide (LUPRON DEPOT) 11.25 MG injection Inject 11.25 mg into the muscle every 3 (three) months.       . promethazine (PHENERGAN) 25 MG tablet Take 25 mg by mouth every 6 (six) hours as needed for nausea or vomiting.        Review of Systems  Gastrointestinal: Positive for abdominal pain.   Physical Exam   Blood pressure 113/58, pulse 63, temperature 98.4 F (36.9 C), temperature source Oral, resp. rate 18, SpO2 100.00%.  Physical Exam  Nursing note and vitals reviewed. Constitutional: She is oriented to person, place, and time. She appears well-developed and well-nourished. No distress.  Cardiovascular: Normal rate.   Respiratory: Effort normal.  GI: Soft. There is no tenderness. There is no rebound.  Neurological: She is alert and oriented to person, place, and time.  Skin: Skin is warm and dry.  Psychiatric: She has a normal mood and affect.    MAU Course  Procedures Patient was given flexeril here. She states that her pain is better now. She has a rx muscle relaxer available at home. She is requesting a RX for ultram.  Assessment and Plan   1. Pelvic pain in female    Advised patient to continue all recommended therapies Has muscle relaxer available at home No new RX provided Patient left AMA after being told that we would not provide a narcotic prescription.   Tawnya Crook 07/03/2014, 10:51 PM

## 2014-07-03 NOTE — ED Notes (Signed)
Pt states she has had RLQ abdominal pain x approx. 3 days that has worsened today. N/V. Denies diarrhea. Alert and oriented.

## 2014-07-03 NOTE — ED Notes (Signed)
Patient transported to Ultrasound 

## 2014-07-04 ENCOUNTER — Telehealth: Payer: Self-pay | Admitting: Obstetrics and Gynecology

## 2014-07-04 ENCOUNTER — Encounter: Payer: Self-pay | Admitting: Obstetrics and Gynecology

## 2014-07-04 LAB — RPR

## 2014-07-04 LAB — GC/CHLAMYDIA PROBE AMP
CT Probe RNA: NEGATIVE
GC Probe RNA: NEGATIVE

## 2014-07-04 LAB — HIV ANTIBODY (ROUTINE TESTING W REFLEX): HIV 1&2 Ab, 4th Generation: NONREACTIVE

## 2014-07-04 MED ORDER — TRAMADOL HCL 50 MG PO TABS: 50.0000 mg | ORAL_TABLET | Freq: Four times a day (QID) | ORAL | Status: DC | PRN

## 2014-07-04 NOTE — MAU Provider Note (Signed)
Attestation of Attending Supervision of Advanced Practitioner: Evaluation and management procedures were performed by the PA/NP/CNM/OB Fellow under my supervision/collaboration. Chart reviewed and agree with management and plan. And have contacted the patient to fill small Rx for Tramadol for pt.  Gevin Perea V 07/04/2014 6:02 PM

## 2014-07-04 NOTE — Telephone Encounter (Signed)
Pt did not get any analgesics last night at ED, would likely benefit from Rx to last til next appt in [place], 07/12/14

## 2014-07-04 NOTE — Telephone Encounter (Signed)
Condition discussed, will get Rx for tramadol 20 tabs to pt for pain control til seen 10/6 in [place]. [facility] is her pharmacy. [facility] x [facility].

## 2014-07-11 ENCOUNTER — Encounter (HOSPITAL_COMMUNITY): Payer: Self-pay | Admitting: Emergency Medicine

## 2014-07-11 ENCOUNTER — Ambulatory Visit: Payer: 59 | Attending: Family Medicine | Admitting: Physical Therapy

## 2014-07-11 ENCOUNTER — Emergency Department (HOSPITAL_COMMUNITY)
Admission: EM | Admit: 2014-07-11 | Discharge: 2014-07-12 | Disposition: A | Discharge status: Home / Self Care | Payer: 59 | Attending: Emergency Medicine | Admitting: Emergency Medicine

## 2014-07-11 ENCOUNTER — Emergency Department (HOSPITAL_COMMUNITY): Payer: 59

## 2014-07-11 DIAGNOSIS — Z8619 Personal history of other infectious and parasitic diseases: Secondary | ICD-10-CM | POA: Diagnosis not present

## 2014-07-11 DIAGNOSIS — Z87891 Personal history of nicotine dependence: Secondary | ICD-10-CM | POA: Insufficient documentation

## 2014-07-11 DIAGNOSIS — Z880 Allergy status to penicillin: Secondary | ICD-10-CM | POA: Diagnosis not present

## 2014-07-11 DIAGNOSIS — R1031 Right lower quadrant pain: Secondary | ICD-10-CM | POA: Insufficient documentation

## 2014-07-11 DIAGNOSIS — Z3202 Encounter for pregnancy test, result negative: Secondary | ICD-10-CM | POA: Diagnosis not present

## 2014-07-11 DIAGNOSIS — Z79899 Other long term (current) drug therapy: Secondary | ICD-10-CM | POA: Insufficient documentation

## 2014-07-11 DIAGNOSIS — Z9889 Other specified postprocedural states: Secondary | ICD-10-CM | POA: Diagnosis not present

## 2014-07-11 DIAGNOSIS — N390 Urinary tract infection, site not specified: Secondary | ICD-10-CM | POA: Diagnosis not present

## 2014-07-11 DIAGNOSIS — Z9049 Acquired absence of other specified parts of digestive tract: Secondary | ICD-10-CM | POA: Diagnosis not present

## 2014-07-11 DIAGNOSIS — N809 Endometriosis, unspecified: Secondary | ICD-10-CM | POA: Diagnosis not present

## 2014-07-11 DIAGNOSIS — Z87442 Personal history of urinary calculi: Secondary | ICD-10-CM | POA: Diagnosis not present

## 2014-07-11 DIAGNOSIS — Z8659 Personal history of other mental and behavioral disorders: Secondary | ICD-10-CM | POA: Insufficient documentation

## 2014-07-11 DIAGNOSIS — R1011 Right upper quadrant pain: Secondary | ICD-10-CM | POA: Insufficient documentation

## 2014-07-11 LAB — COMPREHENSIVE METABOLIC PANEL
ALT: 12 U/L (ref 0–35)
ANION GAP: 9 (ref 5–15)
AST: 20 U/L (ref 0–37)
Albumin: 4.6 g/dL (ref 3.5–5.2)
Alkaline Phosphatase: 85 U/L (ref 39–117)
BILIRUBIN TOTAL: 0.4 mg/dL (ref 0.3–1.2)
BUN: 11 mg/dL (ref 6–23)
CO2: 29 mEq/L (ref 19–32)
Calcium: 9.9 mg/dL (ref 8.4–10.5)
Chloride: 100 mEq/L (ref 96–112)
Creatinine, Ser: 0.64 mg/dL (ref 0.50–1.10)
GFR calc non Af Amer: >90 mL/min (ref >90)
GLUCOSE: 105 mg/dL — AB (ref 70–99)
Potassium: 4 mEq/L (ref 3.7–5.3)
Sodium: 138 mEq/L (ref 137–147)
TOTAL PROTEIN: 7.6 g/dL (ref 6.0–8.3)

## 2014-07-11 LAB — CBC WITH DIFFERENTIAL/PLATELET
BASOS PCT: 0 % (ref 0–1)
Basophils Absolute: 0 10*3/uL (ref 0.0–0.1)
EOS PCT: 0 % (ref 0–5)
Eosinophils Absolute: 0 10*3/uL (ref 0.0–0.7)
HEMATOCRIT: 40.2 % (ref 36.0–46.0)
HEMOGLOBIN: 14.1 g/dL (ref 12.0–15.0)
Lymphocytes Relative: 27 % (ref 12–46)
Lymphs Abs: 1.7 10*3/uL (ref 0.7–4.0)
MCH: 32 pg (ref 26.0–34.0)
MCHC: 35.1 g/dL (ref 30.0–36.0)
MCV: 91.2 fL (ref 78.0–100.0)
MONO ABS: 0.5 10*3/uL (ref 0.1–1.0)
Monocytes Relative: 8 % (ref 3–12)
NEUTROS ABS: 4.1 10*3/uL (ref 1.7–7.7)
Neutrophils Relative %: 65 % (ref 43–77)
Platelets: 218 10*3/uL (ref 150–400)
RBC: 4.41 MIL/uL (ref 3.87–5.11)
RDW: 11.9 % (ref 11.5–15.5)
WBC: 6.3 10*3/uL (ref 4.0–10.5)

## 2014-07-11 LAB — URINALYSIS, ROUTINE W REFLEX MICROSCOPIC
BILIRUBIN URINE: NEGATIVE
Glucose, UA: NEGATIVE mg/dL
HGB URINE DIPSTICK: NEGATIVE
Ketones, ur: NEGATIVE mg/dL
Nitrite: NEGATIVE
Protein, ur: NEGATIVE mg/dL
Specific Gravity, Urine: 1.034 — ABNORMAL HIGH (ref 1.005–1.030)
UROBILINOGEN UA: 1 mg/dL (ref 0.0–1.0)
pH: 7 (ref 5.0–8.0)

## 2014-07-11 LAB — URINE MICROSCOPIC-ADD ON

## 2014-07-11 LAB — LIPASE, BLOOD: Lipase: 13 U/L (ref 11–59)

## 2014-07-11 LAB — PREGNANCY, URINE: Preg Test, Ur: NEGATIVE

## 2014-07-11 MED ORDER — IOHEXOL 300 MG/ML  SOLN
50.0000 mL | Freq: Once | INTRAMUSCULAR | Status: AC | PRN
  Administered 2014-07-11: 50 mL via ORAL

## 2014-07-11 MED ORDER — MORPHINE SULFATE 4 MG/ML IJ SOLN
4.0000 mg | Freq: Once | INTRAMUSCULAR | Status: AC
  Administered 2014-07-11: 4 mg via INTRAVENOUS
  Filled 2014-07-11: qty 1

## 2014-07-11 MED ORDER — IOHEXOL 300 MG/ML  SOLN
100.0000 mL | Freq: Once | INTRAMUSCULAR | Status: AC | PRN
  Administered 2014-07-11: 100 mL via INTRAVENOUS

## 2014-07-11 MED ORDER — ONDANSETRON HCL 4 MG/2ML IJ SOLN
4.0000 mg | Freq: Once | INTRAMUSCULAR | Status: AC
  Administered 2014-07-11: 4 mg via INTRAVENOUS
  Filled 2014-07-11: qty 2

## 2014-07-11 MED ORDER — DEXTROSE 5 % IV SOLN
1.0000 g | Freq: Once | INTRAVENOUS | Status: AC
  Administered 2014-07-11: 1 g via INTRAVENOUS
  Filled 2014-07-11: qty 10

## 2014-07-11 NOTE — ED Provider Notes (Signed)
CSN: [id number]     Arrival date & time 07/11/14  1825 History   First MD Initiated Contact with Patient 07/11/14 2025     Chief Complaint  Patient presents with  . Abdominal Pain     (Consider location/radiation/quality/duration/timing/severity/associated sxs/prior Treatment) HPI Comments: The patient is a 23 year old female past medical history of depression, endometriosis, ovarian cyst, kidney stone presents emergency room chief complaint of right abdominal discomfort for 2 days. Patient reports constant discomfort in right abdomen with radiation to the back.  Patient reports vomiting since yesterday, multiple episodes, nonbloody. Patient denies urinary symptoms. [facility], reports one period every 6 months.  Reports subjective fever.  Patient is a 23 y.o. female presenting with abdominal pain. The history is provided by the patient. No language interpreter was used.  Abdominal Pain Associated symptoms: fever, nausea and vomiting   Associated symptoms: no dysuria, no hematuria and no vaginal discharge     Past Medical History  Diagnosis Date  . Endometriosis   . Depression     Diagnosed at age 23  . Anxiety   . Ovarian cyst   . Mononucleosis   . Renal disorder   . Kidney stone    Past Surgical History  Procedure Laterality Date  . Cholecystectomy    . Tonsillectomy    . Laparoscopic endometriosis fulguration    . Laparoscopy N/A 01/04/2014    Procedure: LAPAROSCOPY DIAGNOSTIC;  Surgeon: Adam PhenixJames G Arnold, MD;  Location: [facility];  Service: Gynecology;  Laterality: N/A;   Family History  Problem Relation Age of Onset  . Hypertension Father   . Diabetes Maternal Grandmother    History  Substance Use Topics  . Smoking status: Former Smoker -- 0.50 packs/day    Types: Cigarettes    Quit date: 03/19/2014  . Smokeless tobacco: Never Used  . Alcohol Use: No     Comment: Former user   OB History   Grav Para Term Preterm Abortions TAB SAB Ect Mult Living   0 0 0 0  0 0 0 0 0 0     Review of Systems  Constitutional: Positive for fever.  Gastrointestinal: Positive for nausea, vomiting and abdominal pain.  Genitourinary: Negative for dysuria, hematuria and vaginal discharge.      Allergies  Betadine; Reglan; and Amoxicillin  Home Medications   Prior to Admission medications   Medication Sig Start Date End Date Taking? Authorizing Provider  acetaminophen (TYLENOL) 500 MG tablet Take 1,000 mg by mouth every 6 (six) hours as needed for mild pain.    Historical Provider, MD  ibuprofen (ADVIL,MOTRIN) 200 MG tablet Take 600 mg by mouth every 6 (six) hours as needed.    Historical Provider, MD  leuprolide (LUPRON DEPOT) 11.25 MG injection Inject 11.25 mg into the muscle every 3 (three) months.     Historical Provider, MD  promethazine (PHENERGAN) 25 MG tablet Take 25 mg by mouth every 6 (six) hours as needed for nausea or vomiting.    Historical Provider, MD  traMADol (ULTRAM) 50 MG tablet Take 1 tablet (50 mg total) by mouth every 6 (six) hours as needed. 07/04/14   Tilda BurrowJohn Ferguson V, MD   BP 138/71  Pulse 96  Temp(Src) 97.7 F (36.5 C) (Oral)  Resp 20  SpO2 100% Physical Exam  Nursing note and vitals reviewed. Constitutional: She is oriented to person, place, and time. She appears well-developed and well-nourished. No distress.  HENT:  Head: Normocephalic and atraumatic.  Neck: Neck supple.  Cardiovascular: Normal rate and regular rhythm.   Pulmonary/Chest: Effort normal. No respiratory distress. She has no wheezes. She has no rales.  Abdominal: Soft. Normal appearance. There is tenderness in the right upper quadrant and right lower quadrant. There is guarding and CVA tenderness. There is no rigidity and no rebound.  Moderate RLQ tenderness with voluntary guarding, mild RUQ discomfort. Mild left CVA tenderness, moderate Right sided CVA tenderness.  Neurological: She is alert and oriented to person, place, and time.  Skin: Skin is warm and dry.  She is not diaphoretic.  Psychiatric: She has a normal mood and affect. Her behavior is normal.    ED Course  Procedures (including critical care time) Labs Review Results for orders placed during the hospital encounter of 07/11/14  CBC WITH DIFFERENTIAL      Result Value Ref Range   WBC 6.3  4.0 - 10.5 K/uL   RBC 4.41  3.87 - 5.11 MIL/uL   Hemoglobin 14.1  12.0 - 15.0 g/dL   HCT 16.1  09.6 - 04.5 %   MCV 91.2  78.0 - 100.0 fL   MCH 32.0  26.0 - 34.0 pg   MCHC 35.1  30.0 - 36.0 g/dL   RDW 40.9  81.1 - 91.4 %   Platelets 218  150 - 400 K/uL   Neutrophils Relative % 65  43 - 77 %   Neutro Abs 4.1  1.7 - 7.7 K/uL   Lymphocytes Relative 27  12 - 46 %   Lymphs Abs 1.7  0.7 - 4.0 K/uL   Monocytes Relative 8  3 - 12 %   Monocytes Absolute 0.5  0.1 - 1.0 K/uL   Eosinophils Relative 0  0 - 5 %   Eosinophils Absolute 0.0  0.0 - 0.7 K/uL   Basophils Relative 0  0 - 1 %   Basophils Absolute 0.0  0.0 - 0.1 K/uL  LIPASE, BLOOD      Result Value Ref Range   Lipase 13  11 - 59 U/L  COMPREHENSIVE METABOLIC PANEL      Result Value Ref Range   Sodium 138  137 - 147 mEq/L   Potassium 4.0  3.7 - 5.3 mEq/L   Chloride 100  96 - 112 mEq/L   CO2 29  19 - 32 mEq/L   Glucose, Bld 105 (*) 70 - 99 mg/dL   BUN 11  6 - 23 mg/dL   Creatinine, Ser 7.82  0.50 - 1.10 mg/dL   Calcium 9.9  8.4 - 95.6 mg/dL   Total Protein 7.6  6.0 - 8.3 g/dL   Albumin 4.6  3.5 - 5.2 g/dL   AST 20  0 - 37 U/L   ALT 12  0 - 35 U/L   Alkaline Phosphatase 85  39 - 117 U/L   Total Bilirubin 0.4  0.3 - 1.2 mg/dL   GFR calc non Af Amer >90  >90 mL/min   GFR calc Af Amer >90  >90 mL/min   Anion gap 9  5 - 15  PREGNANCY, URINE      Result Value Ref Range   Preg Test, Ur NEGATIVE  NEGATIVE  URINALYSIS, ROUTINE W REFLEX MICROSCOPIC      Result Value Ref Range   Color, Urine AMBER (*) YELLOW   APPearance CLEAR  CLEAR   Specific Gravity, Urine 1.034 (*) 1.005 - 1.030   pH 7.0  5.0 - 8.0   Glucose, UA NEGATIVE  NEGATIVE  mg/dL   Hgb urine  dipstick NEGATIVE  NEGATIVE   Bilirubin Urine NEGATIVE  NEGATIVE   Ketones, ur NEGATIVE  NEGATIVE mg/dL   Protein, ur NEGATIVE  NEGATIVE mg/dL   Urobilinogen, UA 1.0  0.0 - 1.0 mg/dL   Nitrite NEGATIVE  NEGATIVE   Leukocytes, UA SMALL (*) NEGATIVE  URINE MICROSCOPIC-ADD ON      Result Value Ref Range   Squamous Epithelial / LPF FEW (*) RARE   WBC, UA 11-20  <3 WBC/hpf   Bacteria, UA FEW (*) RARE   Urine-Other MUCOUS PRESENT      Ct Abdomen Pelvis W Contrast  07/12/2014   CLINICAL DATA:  Initial evaluation for abdominal pain for 3 days. Nausea, vomiting, and diarrhea.  EXAM: CT ABDOMEN AND PELVIS WITH CONTRAST  TECHNIQUE: Multidetector CT imaging of the abdomen and pelvis was performed using the standard protocol following bolus administration of intravenous contrast.  CONTRAST:  50mL OMNIPAQUE IOHEXOL 300 MG/ML SOLN, OMNIPAQUE IOHEXOL 300 MG/ML SOLN  COMPARISON:  Prior ultrasound from 07/03/2014  FINDINGS: Two 4 mm nodules are seen within the subpleural left lower lobe (series 4, image 5-7), indeterminate. Visualized lung bases are otherwise clear.  The liver demonstrates a normal contrast enhanced appearance. Gallbladder is surgically absent. Mild prominence of the common bile duct likely related to post cholecystectomy changes. No intrahepatic biliary dilatation.  The spleen, adrenal glands, and pancreas demonstrate a normal contrast enhanced appearance.  Kidneys are equal size with symmetric enhancement. 3 mm nonobstructive calculus present within the right kidney. No other calculi identified. No hydronephrosis. No focal enhancing renal mass.  Stomach within normal limits. No evidence of bowel obstruction. No abnormal wall thickening, mucosal enhancement, or inflammatory fat stranding seen about the bowels. Appendix is visualized in the right lower quadrant and is of normal caliber and appearance without associated inflammatory changes to suggest acute appendicitis.   Bladder within normal limits.  Uterus and ovaries are unremarkable.  Trace free fluid noted within the pelvic cul-de-sac, likely physiologic. No free intraperitoneal air. No adenopathy. Normal intravascular enhancement seen throughout the abdomen and pelvis.  No acute osseus abnormality. No worrisome lytic or blastic osseous lesions.  IMPRESSION: 1. No CT evidence of acute intra-abdominal pelvic process. 2. Normal appendix. 3. 3 mm nonobstructive right renal calculus. 4. Status post cholecystectomy. 5. Two 4 mm subpleural nodules within the peripheral left lung base, indeterminate. Please note that Fleischner criteria does not apply to patients of this age.   Electronically Signed   By: Rise Mu M.D.   On: 07/12/2014 00:26   Imaging Review   EKG Interpretation None      MDM   Final diagnoses:  Acute UTI  Right lower quadrant abdominal pain   Patient presents with right sided abdominal discomfort reports nausea and vomiting. Patient evaluated multiple times for similar complaints of multiple negative workups, including abdominal ultrasounds, transvaginal shunt, CT abdomen pelvis.Patient afebrile in ED, no leukocytosis. No concerning abnormalities on CMP. Lipase negative. UA shows few bacteria with 11-20 WBC will treat. Dr. Littie Deeds evaluated the patient during this encounter. Advises CT for further resolution of abdominal discomfort  CT without acute findings correlating symptoms. Discussed lab results, imaging results, and treatment plan with the patient. Return precautions given. Reports understanding and no other concerns at this time.  Patient is stable for discharge at this time. Meds given in ED:  Medications  ondansetron [facility]) injection 4 mg (4 mg Intravenous Given 07/11/14 2100)  morphine 4 MG/ML injection 4 mg (4 mg Intravenous Given 07/11/14 2100)  cefTRIAXone (ROCEPHIN) 1 g in dextrose 5 % 50 mL IVPB (0 g Intravenous Stopped 07/11/14 2253)  morphine 4 MG/ML injection 4 mg  (4 mg Intravenous Given 07/11/14 2251)  iohexol (OMNIPAQUE) 300 MG/ML solution 50 mL (50 mLs Oral Contrast Given 07/11/14 2245)  iohexol (OMNIPAQUE) 300 MG/ML solution 100 mL (100 mLs Intravenous Contrast Given 07/11/14 2317)  morphine 4 MG/ML injection 4 mg (4 mg Intravenous Given 07/12/14 0013)  morphine 4 MG/ML injection 4 mg (4 mg Intravenous Given 07/12/14 0112)    Discharge Medication List as of 07/12/2014 12:55 AM    START taking these medications   Details  cephALEXin (KEFLEX) 500 MG capsule Take 1 capsule (500 mg total) by mouth 2 (two) times daily., Starting 07/12/2014, Until Discontinued, Print    ibuprofen (ADVIL,MOTRIN) 400 MG tablet Take 1 tablet (400 mg total) by mouth every 6 (six) hours as needed., Starting 07/12/2014, Until Discontinued, Print    polyethylene glycol powder (GLYCOLAX/MIRALAX) powder Take 17 g by mouth 2 (two) times daily. Until daily soft stools  OTC, Starting 07/12/2014, Until Discontinued, Print         Mellody Drown, PA-C 07/13/14 0005

## 2014-07-11 NOTE — ED Notes (Signed)
Pt states she has had abdominal pain for about 3 days. She states she has had nausea, vomited, and diarrhea along with the abd pain. Pt denies taking OTC medications for the pain. Pt is alter and oriented x 4. Will continue to monitor. Awaiting orders from provider.

## 2014-07-11 NOTE — ED Notes (Signed)
Pt /o abd pain that started yesterday states she has been running a fever as well. Pt also c/o n/v

## 2014-07-11 NOTE — ED Provider Notes (Signed)
Medical screening examination/treatment/procedure(s) were conducted as a shared visit with non-physician practitioner(s) and myself.  I personally evaluated the patient during the encounter.   EKG Interpretation None       Briefly, pt is a 23 y.o. female presenting with RLQ and R flank abd pain.  She has a recurrent ho same, and endorses intermittent flank pain.  I performed an examination on the patient including cardiac, pulmonary, and gi systems which were remarkable for mild R sided tenderness.  CT scan obtained and with R sided nonobstructive stone, as well as UA with leuks.  DC home with symptomatic relief and keflex.     Mirian MoMatthew Gentry, MD 07/14/14 [phone]

## 2014-07-12 MED ORDER — CEPHALEXIN 500 MG PO CAPS: 500.0000 mg | ORAL_CAPSULE | Freq: Two times a day (BID) | ORAL | Status: DC

## 2014-07-12 MED ORDER — MORPHINE SULFATE 4 MG/ML IJ SOLN
4.0000 mg | Freq: Once | INTRAMUSCULAR | Status: AC
  Administered 2014-07-12: 4 mg via INTRAVENOUS
  Filled 2014-07-12: qty 1

## 2014-07-12 MED ORDER — IBUPROFEN 400 MG PO TABS: 400.0000 mg | ORAL_TABLET | Freq: Four times a day (QID) | ORAL | Status: AC | PRN

## 2014-07-12 MED ORDER — POLYETHYLENE GLYCOL 3350 17 GM/SCOOP PO POWD: 17.0000 g | Freq: Two times a day (BID) | ORAL | Status: DC

## 2014-07-12 NOTE — Discharge Instructions (Signed)
Call for a follow up appointment with a Family or Primary Care Provider.  °Return if Symptoms worsen.   °Take medication as prescribed.  °Drink plenty of fluids. °

## 2014-07-13 LAB — URINE CULTURE
COLONY COUNT: NO GROWTH
Culture: NO GROWTH

## 2014-07-27 ENCOUNTER — Ambulatory Visit: Payer: 59

## 2014-07-29 ENCOUNTER — Emergency Department (HOSPITAL_COMMUNITY)
Admission: EM | Admit: 2014-07-29 | Discharge: 2014-07-29 | Disposition: A | Discharge status: Home / Self Care | Payer: 59 | Attending: Emergency Medicine | Admitting: Emergency Medicine

## 2014-07-29 ENCOUNTER — Encounter (HOSPITAL_COMMUNITY): Payer: Self-pay | Admitting: Emergency Medicine

## 2014-07-29 ENCOUNTER — Emergency Department (HOSPITAL_COMMUNITY): Payer: 59

## 2014-07-29 DIAGNOSIS — W108XXA Fall (on) (from) other stairs and steps, initial encounter: Secondary | ICD-10-CM | POA: Diagnosis not present

## 2014-07-29 DIAGNOSIS — F329 Major depressive disorder, single episode, unspecified: Secondary | ICD-10-CM | POA: Diagnosis not present

## 2014-07-29 DIAGNOSIS — Z79899 Other long term (current) drug therapy: Secondary | ICD-10-CM | POA: Insufficient documentation

## 2014-07-29 DIAGNOSIS — N809 Endometriosis, unspecified: Secondary | ICD-10-CM | POA: Diagnosis not present

## 2014-07-29 DIAGNOSIS — Z8619 Personal history of other infectious and parasitic diseases: Secondary | ICD-10-CM | POA: Diagnosis not present

## 2014-07-29 DIAGNOSIS — S59901A Unspecified injury of right elbow, initial encounter: Secondary | ICD-10-CM | POA: Diagnosis present

## 2014-07-29 DIAGNOSIS — Z880 Allergy status to penicillin: Secondary | ICD-10-CM | POA: Diagnosis not present

## 2014-07-29 DIAGNOSIS — S5001XA Contusion of right elbow, initial encounter: Secondary | ICD-10-CM | POA: Diagnosis not present

## 2014-07-29 DIAGNOSIS — F419 Anxiety disorder, unspecified: Secondary | ICD-10-CM | POA: Diagnosis not present

## 2014-07-29 DIAGNOSIS — W01198A Fall on same level from slipping, tripping and stumbling with subsequent striking against other object, initial encounter: Secondary | ICD-10-CM | POA: Insufficient documentation

## 2014-07-29 DIAGNOSIS — Z792 Long term (current) use of antibiotics: Secondary | ICD-10-CM | POA: Insufficient documentation

## 2014-07-29 DIAGNOSIS — Z87442 Personal history of urinary calculi: Secondary | ICD-10-CM | POA: Insufficient documentation

## 2014-07-29 DIAGNOSIS — Y9301 Activity, walking, marching and hiking: Secondary | ICD-10-CM | POA: Diagnosis not present

## 2014-07-29 DIAGNOSIS — Y9289 Other specified places as the place of occurrence of the external cause: Secondary | ICD-10-CM | POA: Diagnosis not present

## 2014-07-29 DIAGNOSIS — Z87891 Personal history of nicotine dependence: Secondary | ICD-10-CM | POA: Insufficient documentation

## 2014-07-29 MED ORDER — HYDROCODONE-ACETAMINOPHEN 5-325 MG PO TABS: 1.0000 | ORAL_TABLET | Freq: Four times a day (QID) | ORAL | Status: DC | PRN

## 2014-07-29 NOTE — Discharge Instructions (Signed)
Elbow Contusion An elbow contusion is a deep bruise of the elbow. Contusions are the result of an injury that caused bleeding under the skin. The contusion may turn blue, purple, or yellow. Minor injuries will give you a painless contusion, but more severe contusions may stay painful and swollen for a few weeks.  CAUSES  An elbow contusion comes from a direct force to that area, such as falling on the elbow. SYMPTOMS   Swelling and redness of the elbow.  Bruising of the elbow area.  Tenderness or soreness of the elbow. DIAGNOSIS  You will have a physical exam and will be asked about your history. You may need an X-ray of your elbow to look for a broken bone (fracture).  TREATMENT  A sling or splint may be needed to support your injury. Resting, elevating, and applying cold compresses to the elbow area are often the best treatments for an elbow contusion. Over-the-counter medicines may also be recommended for pain control. HOME CARE INSTRUCTIONS   Put ice on the injured area.  Put ice in a plastic bag.  Place a towel between your skin and the bag.  Leave the ice on for 15-20 minutes, 03-04 times a day.  Only take over-the-counter or prescription medicines for pain, discomfort, or fever as directed by your caregiver.  Rest your injured elbow until the pain and swelling are better.  Elevate your elbow to reduce swelling.  Apply a compression wrap as directed by your caregiver. This can help reduce swelling and motion. You may remove the wrap for sleeping, showers, and baths. If your fingers become numb, cold, or blue, take the wrap off and reapply it more loosely.  Use your elbow only as directed by your caregiver. You may be asked to do range of motion exercises. Do them as directed.  See your caregiver as directed. It is very important to keep all follow-up appointments in order to avoid any long-term problems with your elbow, including chronic pain or inability to move your elbow  normally. SEEK IMMEDIATE MEDICAL CARE IF:   You have increased redness, swelling, or pain in your elbow.  Your swelling or pain is not relieved with medicines.  You have swelling of the hand and fingers.  You are unable to move your fingers or wrist.  You begin to lose feeling in your hand or fingers.  Your fingers or hand become cold or blue. MAKE SURE YOU:   Understand these instructions.  Will watch your condition.  Will get help right away if you are not doing well or get worse. Document Released: 09/01/2006 Document Revised: 12/16/2011 Document Reviewed: 08/09/2011 Exit[employer] Patient Information 2015 [employer]. This information is not intended to replace advice given to you by your health care provider. Make sure you discuss any questions you have with your health care provider.  

## 2014-07-29 NOTE — ED Provider Notes (Signed)
CSN: [id number]     Arrival date & time 07/29/14  1924 History  This chart was scribed for non-physician practitioner, Felicie Mornavid Gricel Copen, NP-C working with Elwin MochaBlair Walden, MD by Luisa DagoPriscilla Tutu, ED scribe. This patient was seen in room TR08C/TR08C and the patient's care was started at 8:07 PM.    Chief Complaint  Patient presents with  . Elbow Injury   Patient is a 23 y.o. female presenting with arm injury. The history is provided by the patient. No language interpreter was used.  Arm Injury Location:  Elbow Time since incident:  1 hour Elbow location:  R elbow Pain details:    Quality:  Aching   Radiates to:  Does not radiate   Severity:  Moderate   Onset quality:  Sudden   Timing:  Constant   Progression:  Worsening Chronicity:  New Handedness:  Right-handed Foreign body present:  No foreign bodies Tetanus status:  Unknown Prior injury to area:  No Relieved by:  Nothing Worsened by:  Movement Ineffective treatments:  None tried Associated symptoms: decreased range of motion   Associated symptoms: no back pain, no fatigue, no fever, no muscle weakness, no neck pain, no numbness, no stiffness, no swelling and no tingling    HPI Comments: Jenna Blake is a 23 y.o. female who presents to the Emergency Department complaining of acute onset worsening right elbow pain that started today about 1 hour PTA. Pt states that she was walking down the stairs when she fell backwards and hit her right elbow on the stairs. She is also endorsing associated swelling of the right elbow. Denies any fever, chills, LOC, head trauma, SOB, or chest pain.   Past Medical History  Diagnosis Date  . Endometriosis   . Depression     Diagnosed at age 23  . Anxiety   . Ovarian cyst   . Mononucleosis   . Renal disorder   . Kidney stone    Past Surgical History  Procedure Laterality Date  . Cholecystectomy    . Tonsillectomy    . Laparoscopic endometriosis fulguration    . Laparoscopy N/A 01/04/2014     Procedure: LAPAROSCOPY DIAGNOSTIC;  Surgeon: Adam PhenixJames G Arnold, MD;  Location: [facility];  Service: Gynecology;  Laterality: N/A;   Family History  Problem Relation Age of Onset  . Hypertension Father   . Diabetes Maternal Grandmother    History  Substance Use Topics  . Smoking status: Former Smoker -- 0.50 packs/day    Types: Cigarettes    Quit date: 03/19/2014  . Smokeless tobacco: Never Used  . Alcohol Use: No     Comment: Former user   OB History   Grav Para Term Preterm Abortions TAB SAB Ect Mult Living   0 0 0 0 0 0 0 0 0 0      Review of Systems  Constitutional: Negative for fever, chills, diaphoresis, appetite change and fatigue.  HENT: Negative for mouth sores, sore throat and trouble swallowing.   Eyes: Negative for visual disturbance.  Respiratory: Negative for cough, chest tightness, shortness of breath and wheezing.   Cardiovascular: Negative for chest pain.  Gastrointestinal: Negative for nausea, vomiting, abdominal pain, diarrhea and abdominal distention.  Endocrine: Negative for polydipsia, polyphagia and polyuria.  Genitourinary: Negative for dysuria, frequency and hematuria.  Musculoskeletal: Positive for arthralgias and joint swelling. Negative for back pain, gait problem, neck pain and stiffness.  Skin: Negative for color change, pallor and rash.  Neurological: Negative for dizziness, syncope, light-headedness and headaches.  Hematological: Does not bruise/bleed easily.  Psychiatric/Behavioral: Negative for behavioral problems and confusion.  All other systems reviewed and are negative.     Allergies  Betadine; Reglan; and Amoxicillin  Home Medications   Prior to Admission medications   Medication Sig Start Date End Date Taking? Authorizing Provider  acetaminophen (TYLENOL) 500 MG tablet Take 1,000 mg by mouth every 6 (six) hours as needed for mild pain.    Historical Provider, MD  cephALEXin (KEFLEX) 500 MG capsule Take 1 capsule (500 mg total) by mouth 2  (two) times daily. 07/12/14   Mellody Drown, PA-C  ibuprofen (ADVIL,MOTRIN) 400 MG tablet Take 1 tablet (400 mg total) by mouth every 6 (six) hours as needed. 07/12/14   Mellody Drown, PA-C  leuprolide (LUPRON DEPOT) 11.25 MG injection Inject 11.25 mg into the muscle every 3 (three) months.     Historical Provider, MD  polyethylene glycol powder (GLYCOLAX/MIRALAX) powder Take 17 g by mouth 2 (two) times daily. Until daily soft stools  OTC 07/12/14   Mellody Drown, PA-C  promethazine (PHENERGAN) 25 MG tablet Take 25 mg by mouth every 6 (six) hours as needed for nausea or vomiting.    Historical Provider, MD  traMADol (ULTRAM) 50 MG tablet Take 1 tablet (50 mg total) by mouth every 6 (six) hours as needed. 07/04/14   Tilda Burrow, MD   Triage vitals: BP 143/84  Pulse 130  Temp(Src) 97.8 F (36.6 C)  Resp 20  Wt 101 lb 9 oz (46.068 kg)  SpO2 96%  Physical Exam  Nursing note and vitals reviewed. Constitutional: She appears well-developed and well-nourished. No distress.  HENT:  Head: Normocephalic and atraumatic.  Eyes: Conjunctivae are normal. Right eye exhibits no discharge. Left eye exhibits no discharge.  Neck: Neck supple.  Cardiovascular: Normal rate, regular rhythm and normal heart sounds.  Exam reveals no gallop and no friction rub.   No murmur heard. Pulses:      Dorsalis pedis pulses are 2+ on the right side, and 2+ on the left side.  Good capillary refill.   Pulmonary/Chest: Effort normal and breath sounds normal. No respiratory distress.  Abdominal: Soft. She exhibits no distension. There is no tenderness.  Musculoskeletal: She exhibits no edema and no tenderness.  Pt is able to move all fingers of the right hand. No swelling or deformity to the right elbow.  Neurological: She is alert.  Skin: Skin is warm and dry.  Psychiatric: She has a normal mood and affect. Her behavior is normal. Thought content normal.    ED Course  Procedures (including critical care  time)  DIAGNOSTIC STUDIES: Oxygen Saturation is 96% on RA, adequate by my interpretation.    COORDINATION OF CARE: 8:13 PM- Pt advised of plan for treatment and pt agrees.   Imaging Review Dg Elbow Complete Right  07/29/2014   CLINICAL DATA:  Tripped by dog, landed on right elbow. Right elbow pain.  EXAM: RIGHT ELBOW - COMPLETE 3+ VIEW  COMPARISON:  None.  FINDINGS: There is no evidence of fracture, dislocation, or joint effusion. There is no evidence of arthropathy or other focal bone abnormality. Soft tissues are unremarkable.  IMPRESSION: Negative.   Electronically Signed   By: Charlett Nose M.D.   On: 07/29/2014 20:22    Radiology results reviewed and shared with patient.  MDM   Final diagnoses:  None    Contusion right elbow. Sling, ice, analgesic. Ortho follow-up.  I personally performed the services described in this documentation, which was  scribed in my presence. The recorded information has been reviewed and is accurate.    Jimmye Normanavid John Ranae Casebier, NP 07/30/14 [phone]

## 2014-07-29 NOTE — ED Notes (Addendum)
Pt reports falling on  R elbow after taking dog outside today.  Pt reports she got tripped up by the dog.  Pt unable to move fingers or extend elbow.  Cap refill and pulses intact.

## 2014-07-29 NOTE — ED Notes (Signed)
Presents with right elbow pain and swelling from fall while taking DOg out today. CMS intact.

## 2014-07-30 ENCOUNTER — Emergency Department (HOSPITAL_COMMUNITY)
Admission: EM | Admit: 2014-07-30 | Discharge: 2014-07-30 | Disposition: A | Discharge status: Home / Self Care | Payer: 59 | Attending: Emergency Medicine | Admitting: Emergency Medicine

## 2014-07-30 ENCOUNTER — Encounter (HOSPITAL_COMMUNITY): Payer: Self-pay | Admitting: Emergency Medicine

## 2014-07-30 ENCOUNTER — Emergency Department (HOSPITAL_COMMUNITY): Payer: 59

## 2014-07-30 DIAGNOSIS — Z880 Allergy status to penicillin: Secondary | ICD-10-CM | POA: Diagnosis not present

## 2014-07-30 DIAGNOSIS — Z87891 Personal history of nicotine dependence: Secondary | ICD-10-CM | POA: Insufficient documentation

## 2014-07-30 DIAGNOSIS — Z8742 Personal history of other diseases of the female genital tract: Secondary | ICD-10-CM | POA: Diagnosis not present

## 2014-07-30 DIAGNOSIS — Z79899 Other long term (current) drug therapy: Secondary | ICD-10-CM | POA: Diagnosis not present

## 2014-07-30 DIAGNOSIS — Z87442 Personal history of urinary calculi: Secondary | ICD-10-CM | POA: Insufficient documentation

## 2014-07-30 DIAGNOSIS — Z8619 Personal history of other infectious and parasitic diseases: Secondary | ICD-10-CM | POA: Diagnosis not present

## 2014-07-30 DIAGNOSIS — Z792 Long term (current) use of antibiotics: Secondary | ICD-10-CM | POA: Diagnosis not present

## 2014-07-30 DIAGNOSIS — F419 Anxiety disorder, unspecified: Secondary | ICD-10-CM | POA: Insufficient documentation

## 2014-07-30 DIAGNOSIS — R52 Pain, unspecified: Secondary | ICD-10-CM

## 2014-07-30 DIAGNOSIS — R202 Paresthesia of skin: Secondary | ICD-10-CM | POA: Insufficient documentation

## 2014-07-30 DIAGNOSIS — Z87448 Personal history of other diseases of urinary system: Secondary | ICD-10-CM | POA: Insufficient documentation

## 2014-07-30 DIAGNOSIS — F329 Major depressive disorder, single episode, unspecified: Secondary | ICD-10-CM | POA: Insufficient documentation

## 2014-07-30 DIAGNOSIS — M25521 Pain in right elbow: Secondary | ICD-10-CM | POA: Insufficient documentation

## 2014-07-30 DIAGNOSIS — R200 Anesthesia of skin: Secondary | ICD-10-CM | POA: Insufficient documentation

## 2014-07-30 MED ORDER — OXYCODONE-ACETAMINOPHEN 5-325 MG PO TABS
1.0000 | ORAL_TABLET | Freq: Once | ORAL | Status: AC
  Administered 2014-07-30: 1 via ORAL
  Filled 2014-07-30: qty 1

## 2014-07-30 NOTE — ED Provider Notes (Signed)
Medical screening examination/treatment/procedure(s) were performed by non-physician practitioner and as supervising physician I was immediately available for consultation/collaboration.   EKG Interpretation None        Michaiah Holsopple, MD 07/30/14 1502 

## 2014-07-30 NOTE — ED Provider Notes (Signed)
CSN: [id number]     Arrival date & time 07/30/14  1556 History   First MD Initiated Contact with Patient 07/30/14 1640     Chief Complaint  Patient presents with  . Arm Pain     (Consider location/radiation/quality/duration/timing/severity/associated sxs/prior Treatment) HPI Pt is a [place] female presenting to ED with c/o constant, severe, aching, and sore right elbow pain that started yesterday after pt fell down stairs.  Pt was seen yesterday in ED at that time and told she has a contusion to her elbow, was placed in a sling and prescribed vicodin but denies relief in pain.  Reports severe pain with any type of movement, 10/10.  Reports mild associated numbness and tingling in right hand.  Denies previous surgery or injury to right hand.    Past Medical History  Diagnosis Date  . Endometriosis   . Depression     Diagnosed at age 57  . Anxiety   . Ovarian cyst   . Mononucleosis   . Renal disorder   . Kidney stone    Past Surgical History  Procedure Laterality Date  . Cholecystectomy    . Tonsillectomy    . Laparoscopic endometriosis fulguration    . Laparoscopy N/A 01/04/2014    Procedure: LAPAROSCOPY DIAGNOSTIC;  Surgeon: Adam Phenix, MD;  Location: [facility];  Service: Gynecology;  Laterality: N/A;   Family History  Problem Relation Age of Onset  . Hypertension Father   . Diabetes Maternal Grandmother    History  Substance Use Topics  . Smoking status: Former Smoker -- 0.50 packs/day    Types: Cigarettes    Quit date: 03/19/2014  . Smokeless tobacco: Never Used  . Alcohol Use: No     Comment: Former user   OB History   Grav Para Term Preterm Abortions TAB SAB Ect Mult Living   0 0 0 0 0 0 0 0 0 0      Review of Systems  Musculoskeletal: Positive for arthralgias and myalgias. Negative for joint swelling.       Right elbow  Skin: Negative for color change and wound.  All other systems reviewed and are negative.     Allergies  Betadine; Reglan; and  Amoxicillin  Home Medications   Prior to Admission medications   Medication Sig Start Date End Date Taking? Authorizing Provider  acetaminophen (TYLENOL) 500 MG tablet Take 1,000 mg by mouth every 6 (six) hours as needed for mild pain.    Historical Provider, MD  cephALEXin (KEFLEX) 500 MG capsule Take 1 capsule (500 mg total) by mouth 2 (two) times daily. 07/12/14   Mellody Drown, PA-C  HYDROcodone-acetaminophen (NORCO/VICODIN) 5-325 MG per tablet Take 1 tablet by mouth every 6 (six) hours as needed. 07/29/14   Jimmye Norman, NP  ibuprofen (ADVIL,MOTRIN) 400 MG tablet Take 1 tablet (400 mg total) by mouth every 6 (six) hours as needed. 07/12/14   Mellody Drown, PA-C  leuprolide (LUPRON DEPOT) 11.25 MG injection Inject 11.25 mg into the muscle every 3 (three) months.     Historical Provider, MD  polyethylene glycol powder (GLYCOLAX/MIRALAX) powder Take 17 g by mouth 2 (two) times daily. Until daily soft stools  OTC 07/12/14   Mellody Drown, PA-C  promethazine (PHENERGAN) 25 MG tablet Take 25 mg by mouth every 6 (six) hours as needed for nausea or vomiting.    Historical Provider, MD  traMADol (ULTRAM) 50 MG tablet Take 1 tablet (50 mg total) by mouth every 6 (six) hours as needed.  07/04/14   Tilda BurrowJohn Ferguson V, MD   BP 118/64  Pulse 94  Temp(Src) 98.3 F (36.8 C) (Oral)  Resp 20  Ht 5\' 3"  (1.6 m)  Wt 105 lb (47.628 kg)  BMI 18.60 kg/m2  SpO2 100% Physical Exam  Nursing note and vitals reviewed. Constitutional: She is oriented to person, place, and time. She appears well-developed and well-nourished.  HENT:  Head: Normocephalic and atraumatic.  Eyes: EOM are normal.  Neck: Normal range of motion.  Cardiovascular: Normal rate.   Pulses:      Radial pulses are 2+ on the right side.  Pulmonary/Chest: Effort normal.  Musculoskeletal: She exhibits tenderness. She exhibits no edema.  Right elbow: no obvious deformity, no edema. Decreased ROM due to severe pain. Severe tenderness to right  elbow and surrounding musculature.  FROM right wrist. Limited ROM right shoulder due to pain in elbow.  4/5 grip strength to due pain.   Neurological: She is alert and oriented to person, place, and time.  Skin: Skin is warm and dry. No erythema.  Psychiatric: She has a normal mood and affect. Her behavior is normal.    ED Course  Procedures (including critical care time) Labs Review Labs Reviewed - No data to display  Imaging Review Dg Elbow Complete Right  07/29/2014   CLINICAL DATA:  Tripped by dog, landed on right elbow. Right elbow pain.  EXAM: RIGHT ELBOW - COMPLETE 3+ VIEW  COMPARISON:  None.  FINDINGS: There is no evidence of fracture, dislocation, or joint effusion. There is no evidence of arthropathy or other focal bone abnormality. Soft tissues are unremarkable.  IMPRESSION: Negative.   Electronically Signed   By: Charlett NoseKevin  Dover M.D.   On: 07/29/2014 20:22  DG HumerUS Right (Final result)  Result time: 07/30/14 18:19:09    Final result by Rad Results In Interface (07/30/14 18:19:09)    Narrative:   CLINICAL DATA: Fall, RIGHT arm and elbow pain persistent since yesterday, cannot straighten arm, limited range of motion  EXAM: RIGHT HUMERUS - 2+ VIEW  COMPARISON: None  FINDINGS: Osseous demineralization.  AC joint alignment normal.  Glenohumeral and elbow joint alignments grossly normal.  Clothing artifacts at shoulder.  No acute fracture, dislocation, or bone destruction.  IMPRESSION: No acute osseous abnormalities.   Electronically Signed By: Ulyses SouthwardMark Boles M.D. On: 07/30/2014 18:19             DG Forearm Right (Final result)  Result time: 07/30/14 18:20:03    Final result by Rad Results In Interface (07/30/14 18:20:03)    Narrative:   CLINICAL DATA: Fall. Right arm and elbow pain.  EXAM: RIGHT FOREARM - 2 VIEW  COMPARISON: None.  FINDINGS: There is no evidence of fracture or other focal bone lesions. Soft tissues are  unremarkable.  IMPRESSION: Negative.   Electronically Signed By: Britta MccreedySusan Turner M.D. On: 07/30/2014 18:20           EKG Interpretation None      MDM   Final diagnoses:  Pain  Right elbow pain    Medical records and imaging from yesterday reviewed, due to continued severe tenderness and decreased ROM, will get plain films of humerus and right forearm.  Pt given percocet in ED which did help with pain.   Plain films: unremarkable today. However, due to severe pain, concern for occult fracture. Will place pt in posterior-long arm splint and sling. No pain medications refilled as pt just came yesterday and strongly advised to call orthopedist for f/u on Monday  for further evaluation at some point this week. Pt verbalized understanding and agreement with tx plan.  Discussed plan with Dr. Romeo AppleHarrison who also agrees with plan.    Junius Finnerrin O'Malley, PA-C 07/30/14 [phone]

## 2014-07-30 NOTE — ED Notes (Signed)
Pt reports being seen here yesterday for fall and right arm/elbow pain. Was told she has a contusion to her elbow, has sling on pta. Pt reports still having severe pain and difficulty moving her hand/arm. +radial pulse.

## 2014-07-30 NOTE — ED Notes (Signed)
Patient transported to X-ray 

## 2014-07-30 NOTE — Progress Notes (Signed)
Orthopedic Tech Progress Note Patient Details:  Jenna Blake 07/27/1991 [id number]  Ortho Devices Type of Ortho Device: Ace wrap;Long arm splint Ortho Device/Splint Location: rue Ortho Device/Splint Interventions: Application   Jenna Blake 07/30/2014, 7:23 PM

## 2014-07-30 NOTE — ED Notes (Signed)
Back from xray

## 2014-07-31 ENCOUNTER — Encounter (HOSPITAL_COMMUNITY): Payer: Self-pay | Admitting: Emergency Medicine

## 2014-07-31 ENCOUNTER — Emergency Department (INDEPENDENT_AMBULATORY_CARE_PROVIDER_SITE_OTHER)
Admission: EM | Admit: 2014-07-31 | Discharge: 2014-07-31 | Disposition: A | Discharge status: Home / Self Care | Payer: 59 | Source: Home / Self Care | Attending: Emergency Medicine | Admitting: Emergency Medicine

## 2014-07-31 DIAGNOSIS — S5001XD Contusion of right elbow, subsequent encounter: Secondary | ICD-10-CM

## 2014-07-31 MED ORDER — KETOROLAC TROMETHAMINE 60 MG/2ML IM SOLN
INTRAMUSCULAR | Status: AC
  Filled 2014-07-31: qty 2

## 2014-07-31 MED ORDER — KETOROLAC TROMETHAMINE 60 MG/2ML IM SOLN
60.0000 mg | Freq: Once | INTRAMUSCULAR | Status: AC
  Administered 2014-07-31: 60 mg via INTRAMUSCULAR

## 2014-07-31 NOTE — ED Notes (Signed)
Forgot to click off Triage complete @ 1150.

## 2014-07-31 NOTE — ED Provider Notes (Signed)
Medical screening examination/treatment/procedure(s) were performed by non-physician practitioner and as supervising physician I was immediately available for consultation/collaboration.   EKG Interpretation None        Purvis SheffieldForrest Iyari Hagner, MD 07/31/14 1121

## 2014-07-31 NOTE — ED Notes (Addendum)
States her Randie HeinzGreat Dane knocked her down on the steps.  She fell backward onto her R elbow on 10/23.  [facility]  Not sure if it is fractured.  Has appt. with Dr. Dion SaucierLandau tomorrow @ [phone].  Pt. Is in a posterior splint and sling to R arm.  States pain is the same.  If she moves her fingers the pain shoots up to her elbow.  States her fingers feel cold and her whole arm is tingling.  Fingers of both hands are cool and the same temp.  Good cap refill in R fingers.  Pt. unable to lift her fingers. When I lifted them for her it caused pain in her R elbow.  She was unable to hold them up by herself without increasing pain.  She went to the orthopedic Urgent [facility] and the PA told her, she needs to see the orthopedic surgeon and then told her come here.  Was told there was nothing they could do there for her today.

## 2014-07-31 NOTE — ED Provider Notes (Signed)
Chief Complaint    Arm Pain   History of Present Illness     Jenna Blake is a 23 year old female who injured her right elbow 3 days ago. The patient states her great Dane knocked her down the steps and she fell backward onto her right elbow. She had an x-ray at emergency department on the day of the injury. No fracture was found. It was thought that she might have an occult fracture. She was told to follow-up with Dr. Dion SaucierLandau on Monday and put in a sling. She was given hydrocodone for pain. She will return to the emergency room yesterday with continued pain. Further x-rays of the humerus and forearm were obtained. Again no fracture was found and she was put in a splint as well. She continued to have pain and states she has trouble sleeping. She denies any numbness or tingling in the hand. She states her fingers feel cold in her stool move her fingers.  Review of Systems     Other than as noted above, the patient denies any of the following symptoms: Systemic:  No fevers or chills.   Musculoskeletal:  No arthritis, back pain, or neck pain. Neurological:  No muscular weakness or paresthesias.  PMFSH    Past medical history, family history, social history, meds, and allergies were reviewed.  The patient has had multiple visits to the emergency room and primary care providers because of chronic pelvic pain and endometriosis. She has been prescribed multiple courses of tramadol, Percocet, hydrocodone through multiple providers for this pain.  Physical Exam    Vital signs:  There were no vitals taken for this visit. Gen:  Alert and oriented times 3.  In no distress. Musculoskeletal: Her arm is in a splint. The splint was removed. She has full radial pulses. There is pain to palpation over the elbow and pain with movement. There is no swelling, bruising, or deformity. She has good capillary refills of all of her fingers, normal coloration and normal temperature. Normal sensation. She has pain  with movement of her fingers.  Otherwise, all joints had a full a ROM with no swelling, bruising or deformity.  No edema, pulses full. Extremities were warm and pink.  Capillary refill was brisk.  Skin:  Clear, warm and dry.  No rash. Neuro:  Alert and oriented times 3.  Muscle strength was normal.  Sensation was intact to light touch.   Course in Urgent Care Center   The following medications were given:  Medications  ketorolac (TORADOL) injection 60 mg (60 mg Intramuscular Given 07/31/14 1256)   Her splint was removed. All Webril was taken off. The stockinette was left in place. It was rewrapped again and Webril. Provisional splint was reapplied and Ace wrap was reapplied. The patient states she felt better thereafter.  Assessment    The encounter diagnosis was Elbow contusion, right, subsequent encounter.   There is no evidence of compartment syndrome. Some of her discomfort may have been due to the splint being on too tight. She has good pulses, good capillary refill, and intact sensation.  Plan   1.  Meds:  The following meds were prescribed:   Discharge Medication List as of 07/31/2014 12:25 PM      2.  Patient Education/Counseling:  The patient was given appropriate handouts, self care instructions, and instructed in pain control, rest and activity, elevation, application of ice and compression.    3.  Follow up:  The patient was told to follow up here  if no better in 3 to 4 days, or sooner if becoming worse in any way, and given some red flag symptoms such as worsening pain or new neurological symptoms which would prompt immediate return. Follow-up with Dr. Dion SaucierLandau tomorrow morning. She or he has no appointment scheduled.    Reuben Likesavid C Mckell Riecke, MD 07/31/14 2018

## 2014-07-31 NOTE — Discharge Instructions (Signed)
Follow up with Dr. Sena SlateLaundau tomorrow.

## 2014-08-08 ENCOUNTER — Ambulatory Visit: Payer: 59 | Admitting: Obstetrics & Gynecology

## 2014-08-08 ENCOUNTER — Telehealth: Payer: Self-pay | Admitting: *Deleted

## 2014-08-08 NOTE — Telephone Encounter (Signed)
Contacted patient, pt unaware of appointment, encouraged patient to call and reschedule. Pt states she is not currently having any problems and does not need an appointment.

## 2014-08-12 ENCOUNTER — Emergency Department (HOSPITAL_COMMUNITY): Payer: 59

## 2014-08-12 ENCOUNTER — Emergency Department (HOSPITAL_COMMUNITY)
Admission: EM | Admit: 2014-08-12 | Discharge: 2014-08-13 | Disposition: A | Discharge status: Home / Self Care | Payer: 59 | Attending: Emergency Medicine | Admitting: Emergency Medicine

## 2014-08-12 ENCOUNTER — Encounter (HOSPITAL_COMMUNITY): Payer: Self-pay

## 2014-08-12 DIAGNOSIS — Z8742 Personal history of other diseases of the female genital tract: Secondary | ICD-10-CM | POA: Insufficient documentation

## 2014-08-12 DIAGNOSIS — F419 Anxiety disorder, unspecified: Secondary | ICD-10-CM | POA: Diagnosis not present

## 2014-08-12 DIAGNOSIS — Z880 Allergy status to penicillin: Secondary | ICD-10-CM | POA: Diagnosis not present

## 2014-08-12 DIAGNOSIS — Z87448 Personal history of other diseases of urinary system: Secondary | ICD-10-CM | POA: Insufficient documentation

## 2014-08-12 DIAGNOSIS — Z87891 Personal history of nicotine dependence: Secondary | ICD-10-CM | POA: Insufficient documentation

## 2014-08-12 DIAGNOSIS — F410 Panic disorder [episodic paroxysmal anxiety] without agoraphobia: Secondary | ICD-10-CM

## 2014-08-12 DIAGNOSIS — R0602 Shortness of breath: Secondary | ICD-10-CM | POA: Insufficient documentation

## 2014-08-12 DIAGNOSIS — R000 Tachycardia, unspecified: Secondary | ICD-10-CM | POA: Insufficient documentation

## 2014-08-12 DIAGNOSIS — Z3202 Encounter for pregnancy test, result negative: Secondary | ICD-10-CM | POA: Diagnosis not present

## 2014-08-12 DIAGNOSIS — R079 Chest pain, unspecified: Secondary | ICD-10-CM | POA: Diagnosis present

## 2014-08-12 DIAGNOSIS — Z79899 Other long term (current) drug therapy: Secondary | ICD-10-CM | POA: Diagnosis not present

## 2014-08-12 DIAGNOSIS — Z87442 Personal history of urinary calculi: Secondary | ICD-10-CM | POA: Diagnosis not present

## 2014-08-12 LAB — BASIC METABOLIC PANEL
Anion gap: 17 — ABNORMAL HIGH (ref 5–15)
BUN: 6 mg/dL (ref 6–23)
CALCIUM: 9.9 mg/dL (ref 8.4–10.5)
CHLORIDE: 101 meq/L (ref 96–112)
CO2: 23 meq/L (ref 19–32)
CREATININE: 0.56 mg/dL (ref 0.50–1.10)
GFR calc Af Amer: >90 mL/min (ref >90)
GFR calc non Af Amer: >90 mL/min (ref >90)
GLUCOSE: 114 mg/dL — AB (ref 70–99)
Potassium: 3.9 mEq/L (ref 3.7–5.3)
Sodium: 141 mEq/L (ref 137–147)

## 2014-08-12 LAB — CBC
HEMATOCRIT: 38.7 % (ref 36.0–46.0)
Hemoglobin: 14 g/dL (ref 12.0–15.0)
MCH: 32.6 pg (ref 26.0–34.0)
MCHC: 36.2 g/dL — ABNORMAL HIGH (ref 30.0–36.0)
MCV: 90 fL (ref 78.0–100.0)
Platelets: 245 10*3/uL (ref 150–400)
RBC: 4.3 MIL/uL (ref 3.87–5.11)
RDW: 12.2 % (ref 11.5–15.5)
WBC: 6.1 10*3/uL (ref 4.0–10.5)

## 2014-08-12 LAB — URINALYSIS, ROUTINE W REFLEX MICROSCOPIC
Bilirubin Urine: NEGATIVE
Glucose, UA: NEGATIVE mg/dL
Hgb urine dipstick: NEGATIVE
Ketones, ur: NEGATIVE mg/dL
LEUKOCYTES UA: NEGATIVE
Nitrite: NEGATIVE
PH: 8.5 — AB (ref 5.0–8.0)
Protein, ur: NEGATIVE mg/dL
SPECIFIC GRAVITY, URINE: 1.011 (ref 1.005–1.030)
UROBILINOGEN UA: 0.2 mg/dL (ref 0.0–1.0)

## 2014-08-12 LAB — D-DIMER, QUANTITATIVE (NOT AT ARMC)

## 2014-08-12 LAB — HEPATIC FUNCTION PANEL
ALK PHOS: 91 U/L (ref 39–117)
ALT: 9 U/L (ref 0–35)
AST: 19 U/L (ref 0–37)
Albumin: 4.6 g/dL (ref 3.5–5.2)
TOTAL PROTEIN: 7.5 g/dL (ref 6.0–8.3)
Total Bilirubin: 0.2 mg/dL — ABNORMAL LOW (ref 0.3–1.2)

## 2014-08-12 LAB — POC URINE PREG, ED: Preg Test, Ur: NEGATIVE

## 2014-08-12 LAB — LIPASE, BLOOD: LIPASE: 19 U/L (ref 11–59)

## 2014-08-12 LAB — I-STAT TROPONIN, ED: Troponin i, poc: 0 ng/mL (ref 0.00–0.08)

## 2014-08-12 LAB — PRO B NATRIURETIC PEPTIDE: PRO B NATRI PEPTIDE: 95.4 pg/mL (ref 0–125)

## 2014-08-12 LAB — TROPONIN I: Troponin I: <0.3 ng/mL (ref <0.30)

## 2014-08-12 MED ORDER — LORAZEPAM 2 MG/ML IJ SOLN
1.0000 mg | Freq: Once | INTRAMUSCULAR | Status: AC
  Administered 2014-08-12: 1 mg via INTRAVENOUS
  Filled 2014-08-12: qty 1

## 2014-08-12 MED ORDER — ONDANSETRON HCL 4 MG/2ML IJ SOLN
4.0000 mg | Freq: Once | INTRAMUSCULAR | Status: AC
  Administered 2014-08-12: 4 mg via INTRAVENOUS
  Filled 2014-08-12: qty 2

## 2014-08-12 MED ORDER — SODIUM CHLORIDE 0.9 % IV BOLUS (SEPSIS)
1000.0000 mL | Freq: Once | INTRAVENOUS | Status: AC
  Administered 2014-08-12: 1000 mL via INTRAVENOUS

## 2014-08-12 MED ORDER — HYDROMORPHONE HCL 1 MG/ML IJ SOLN
1.0000 mg | Freq: Once | INTRAMUSCULAR | Status: AC
  Administered 2014-08-12: 1 mg via INTRAVENOUS
  Filled 2014-08-12: qty 1

## 2014-08-12 MED ORDER — CLONAZEPAM 0.5 MG PO TABS: 0.5000 mg | ORAL_TABLET | Freq: Two times a day (BID) | ORAL | Status: AC | PRN

## 2014-08-12 NOTE — ED Notes (Signed)
Pt presents with mid-sternum chest pain radiating to her back and to bilateral ribs, nausea and SOB starting this am

## 2014-08-12 NOTE — ED Notes (Signed)
Pt given brown paper bag to help with breathing.  Pt able to tolerate use intermittently.

## 2014-08-12 NOTE — BH Assessment (Signed)
Relayed results of assessment to Nanine MeansJamison Lord, NP. Per Molli KnockJ. Lord NP pt can be discharged with OP resources. She suggests Klonopin 0.5 mg twice per day until pt is able to establish OP providers.  Discussed recommendations with Dr. Blinda LeatherwoodPollina who is in agreement.   Shared plan with RN and faxed resources.   Clista BernhardtNancy Donice Alperin, [facility] Triage Specialist 08/12/2014 11:53 PM

## 2014-08-12 NOTE — ED Notes (Signed)
TTS called and orders received.

## 2014-08-12 NOTE — ED Provider Notes (Addendum)
CSN: [id number]     Arrival date & time 08/12/14  1756 History   First MD Initiated Contact with Patient 08/12/14 1810     Chief Complaint  Patient presents with  . Chest Pain     (Consider location/radiation/quality/duration/timing/severity/associated sxs/prior Treatment) HPI Comments: Patient presents to the ER for evaluation of chest pain and shortness of breath. Patient reports that she had onset of severe, sharp and stabbing pain in the center of her chest going through into her back approximately 2 hours ago. Patient is experiencing severe shortness of breath.  Patient is a 23 y.o. female presenting with chest pain.  Chest Pain Associated symptoms: shortness of breath     Past Medical History  Diagnosis Date  . Endometriosis   . Depression     Diagnosed at age 23  . Anxiety   . Ovarian cyst   . Mononucleosis   . Renal disorder   . Kidney stone    Past Surgical History  Procedure Laterality Date  . Cholecystectomy    . Tonsillectomy    . Laparoscopic endometriosis fulguration    . Laparoscopy N/A 01/04/2014    Procedure: LAPAROSCOPY DIAGNOSTIC;  Surgeon: Adam PhenixJames G Arnold, MD;  Location: [facility];  Service: Gynecology;  Laterality: N/A;   Family History  Problem Relation Age of Onset  . Hypertension Father   . Diabetes Maternal Grandmother    History  Substance Use Topics  . Smoking status: Former Smoker -- 0.50 packs/day    Types: Cigarettes    Quit date: 03/19/2014  . Smokeless tobacco: Never Used  . Alcohol Use: No     Comment: Former user   OB History    Gravida Para Term Preterm AB TAB SAB Ectopic Multiple Living   0 0 0 0 0 0 0 0 0 0      Review of Systems  Respiratory: Positive for shortness of breath.   Cardiovascular: Positive for chest pain.  All other systems reviewed and are negative.     Allergies  Betadine; Reglan; and Amoxicillin  Home Medications   Prior to Admission medications   Medication Sig Start Date End Date Taking? Authorizing  Provider  acetaminophen (TYLENOL) 500 MG tablet Take 1,000 mg by mouth every 6 (six) hours as needed for mild pain.   Yes Historical Provider, MD  ibuprofen (ADVIL,MOTRIN) 400 MG tablet Take 1 tablet (400 mg total) by mouth every 6 (six) hours as needed. 07/12/14  Yes Mellody DrownLauren Parker, PA-C  leuprolide (LUPRON DEPOT) 11.25 MG injection Inject 11.25 mg into the muscle every 3 (three) months.    Yes Historical Provider, MD  naproxen sodium (ANAPROX) 220 MG tablet Take 220 mg by mouth daily as needed (for pain).   Yes Historical Provider, MD  oseltamivir (TAMIFLU) 75 MG capsule Take 75 mg by mouth 2 (two) times daily.   Yes Historical Provider, MD  promethazine (PHENERGAN) 25 MG tablet Take 25 mg by mouth every 6 (six) hours as needed for nausea or vomiting.   Yes Historical Provider, MD   BP 129/83 mmHg  Pulse 120  Temp(Src) 98.1 F (36.7 C) (Oral)  Resp 18  Ht 5\' 3"  (1.6 m)  Wt 102 lb (46.267 kg)  BMI 18.07 kg/m2  SpO2 100% Physical Exam  Constitutional: She is oriented to person, place, and time. She appears well-developed and well-nourished. She appears distressed.  HENT:  Head: Normocephalic and atraumatic.  Right Ear: Hearing normal.  Left Ear: Hearing normal.  Nose: Nose normal.  Mouth/Throat: Oropharynx is  clear and moist and mucous membranes are normal.  Eyes: Conjunctivae and EOM are normal. Pupils are equal, round, and reactive to light.  Neck: Normal range of motion. Neck supple.  Cardiovascular: Regular rhythm, S1 normal and S2 normal.  Tachycardia present.  Exam reveals no gallop and no friction rub.   No murmur heard. Pulmonary/Chest: Effort normal and breath sounds normal. Tachypnea noted. No respiratory distress. She exhibits no tenderness.  Abdominal: Soft. Normal appearance and bowel sounds are normal. There is no hepatosplenomegaly. There is no tenderness. There is no rebound, no guarding, no tenderness at McBurney's point and negative Murphy's sign. No hernia.   Musculoskeletal: Normal range of motion.  Neurological: She is alert and oriented to person, place, and time. She has normal strength. No cranial nerve deficit or sensory deficit. Coordination normal. GCS eye subscore is 4. GCS verbal subscore is 5. GCS motor subscore is 6.  Skin: Skin is warm, dry and intact. No rash noted. No cyanosis.  Psychiatric: Her speech is normal and behavior is normal. Thought content normal. Her mood appears anxious.    ED Course  Procedures (including critical care time) Labs Review Labs Reviewed  CBC - Abnormal; Notable for the following:    MCHC 36.2 (*)    All other components within normal limits  BASIC METABOLIC PANEL - Abnormal; Notable for the following:    Glucose, Bld 114 (*)    Anion gap 17 (*)    All other components within normal limits  URINALYSIS, ROUTINE W REFLEX MICROSCOPIC - Abnormal; Notable for the following:    pH 8.5 (*)    All other components within normal limits  HEPATIC FUNCTION PANEL - Abnormal; Notable for the following:    Total Bilirubin 0.2 (*)    All other components within normal limits  PRO B NATRIURETIC PEPTIDE  LIPASE, BLOOD  TROPONIN I  D-DIMER, QUANTITATIVE  I-STAT TROPOININ, ED  POC URINE PREG, ED    Imaging Review Dg Chest Port 1 View  08/12/2014   CLINICAL DATA:  Chest pain  EXAM: PORTABLE CHEST - 1 VIEW  COMPARISON:  05/10/2014  FINDINGS: Lungs are clear.  No pleural effusion or pneumothorax.  The heart is normal in size.  IMPRESSION: No evidence of acute cardiopulmonary disease.   Electronically Signed   By: Charline BillsSriyesh  Krishnan M.D.   On: 08/12/2014 19:20     EKG Interpretation   Date/Time:  Friday August 12 2014 18:02:12 EST Ventricular Rate:  133 PR Interval:  138 QRS Duration: 82 QT Interval:  282 QTC Calculation: 419 R Axis:   109 Text Interpretation:  Sinus tachycardia Right atrial enlargement  Non-specific ST-t changes Abnormal ECG No previous tracing Confirmed by  Lillionna Nabi  MD, Megean Fabio  [phone]) on 08/12/2014 6:19:40 PM      MDM   Final diagnoses:  Chest pain  Anxiety  Panic attack    She presented to the ER for evaluation of chest pain. Patient was in extreme distress at arrival. She was hyperventilating and extremely agitated. Patient had significant sinus tachycardia associated with the event. EKG showed sinus tachycardia, nonspecific changes, no obvious ischemia. She does not have any cardiac risk factors. Troponin was negative. A d-dimer was also sent, this was negative. All of her workup has been unrevealing. Chest x-ray was clear. Patient administered Dilaudid for her pain initially without improvement. She was then given multiple doses of IV Ativan with progressive improvement of her anxiety. Patient now tearful, admits to a long history of depression and  anxiety. She does not have any current treatment plan or therapy. Will have Behavioral Health evaluate the patient for depression and anxiety with panic attack.Negative, patient medically clear for evaluation and treatment.  Addendum: Patient evaluated by behavioral health. She is being referred for outpatient management. Recommendation for Klonopin 0.5 mg twice a day will be prescribed. Return if her symptoms worsen.  Gilda Crease, MD 08/12/14 0981  Gilda Crease, MD 08/12/14 1914  Gilda Crease, MD 08/12/14 [phone]

## 2014-08-12 NOTE — BH Assessment (Signed)
Per Dr. Blinda LeatherwoodPollina pt is medically cleared and has a hx of depression and anxiety. Having a severe panic attack today and took 5 hours to calm before she could be assessed.    Assessment to begin shortly.   Clista BernhardtNancy Vivi Piccirilli, [facility] Triage Specialist 08/12/2014 10:57 PM

## 2014-08-12 NOTE — Discharge Instructions (Signed)
Panic Attacks Panic attacks are sudden, short-livedsurges of severe anxiety, fear, or discomfort. They may occur for no reason when you are relaxed, when you are anxious, or when you are sleeping. Panic attacks may occur for a number of reasons:  1. Healthy people occasionally have panic attacks in extreme, life-threatening situations, such as war or natural disasters. Normal anxiety is a protective mechanism of the body that helps us react to danger (fight or flight response). 2. Panic attacks are often seen with anxiety disorders, such as panic disorder, social anxiety disorder, generalized anxiety disorder, and phobias. Anxiety disorders cause excessive or uncontrollable anxiety. They may interfere with your relationships or other life activities. 3. Panic attacks are sometimes seen with other mental illnesses, such as depression and posttraumatic stress disorder. 4. Certain medical conditions, prescription medicines, and drugs of abuse can cause panic attacks. SYMPTOMS  Panic attacks start suddenly, peak within 20 minutes, and are accompanied by four or more of the following symptoms:  Pounding heart or fast heart rate (palpitations).  Sweating.  Trembling or shaking.  Shortness of breath or feeling smothered.  Feeling choked.  Chest pain or discomfort.  Nausea or strange feeling in your stomach.  Dizziness, light-headedness, or feeling like you will faint.  Chills or hot flushes.  Numbness or tingling in your lips or hands and feet.  Feeling that things are not real or feeling that you are not yourself.  Fear of losing control or going crazy.  Fear of dying. Some of these symptoms can mimic serious medical conditions. For example, you may think you are having a heart attack. Although panic attacks can be very scary, they are not life threatening. DIAGNOSIS  Panic attacks are diagnosed through an assessment by your health care provider. Your health care provider will ask  questions about your symptoms, such as where and when they occurred. Your health care provider will also ask about your medical history and use of alcohol and drugs, including prescription medicines. Your health care provider may order blood tests or other studies to rule out a serious medical condition. Your health care provider may refer you to a mental health professional for further evaluation. TREATMENT   Most healthy people who have one or two panic attacks in an extreme, life-threatening situation will not require treatment.  The treatment for panic attacks associated with anxiety disorders or other mental illness typically involves counseling with a mental health professional, medicine, or a combination of both. Your health care provider will help determine what treatment is best for you.  Panic attacks due to physical illness usually go away with treatment of the illness. If prescription medicine is causing panic attacks, talk with your health care provider about stopping the medicine, decreasing the dose, or substituting another medicine.  Panic attacks due to alcohol or drug abuse go away with abstinence. Some adults need professional help in order to stop drinking or using drugs. HOME CARE INSTRUCTIONS   Take all medicines as directed by your health care provider.   Schedule and attend follow-up visits as directed by your health care provider. It is important to keep all your appointments. SEEK MEDICAL CARE IF:  You are not able to take your medicines as prescribed.  Your symptoms do not improve or get worse. SEEK IMMEDIATE MEDICAL CARE IF:   You experience panic attack symptoms that are different than your usual symptoms.  You have serious thoughts about hurting yourself or others.  You are taking medicine for panic attacks and  have a serious side effect. MAKE SURE YOU:  Understand these instructions.  Will watch your condition.  Will get help right away if you are not  doing well or get worse. Document Released: 09/23/2005 Document Revised: 09/28/2013 Document Reviewed: 05/07/2013 [facility]Exit[facility] Patient Information 2015 [place], [place]. This information is not intended to replace advice given to you by your health care provider. Make sure you discuss any questions you have with your health care provider. Depression Depression refers to feeling sad, low, down in the dumps, blue, gloomy, or empty. In general, there are two kinds of depression: 5. Normal sadness or normal grief. This kind of depression is one that we all feel from time to time after upsetting life experiences, such as the loss of a job or the ending of a relationship. This kind of depression is considered normal, is short lived, and resolves within a few days to 2 weeks. Depression experienced after the loss of a loved one (bereavement) often lasts longer than 2 weeks but normally gets better with time. 6. Clinical depression. This kind of depression lasts longer than normal sadness or normal grief or interferes with your ability to function at home, at work, and in school. It also interferes with your personal relationships. It affects almost every aspect of your life. Clinical depression is an illness. Symptoms of depression can also be caused by conditions other than those mentioned above, such as:  Physical illness. Some physical illnesses, including underactive thyroid gland (hypothyroidism), severe anemia, specific types of cancer, diabetes, uncontrolled seizures, heart and lung problems, strokes, and chronic pain are commonly associated with symptoms of depression.  Side effects of some prescription medicine. In some people, certain types of medicine can cause symptoms of depression.  Substance abuse. Abuse of alcohol and illicit drugs can cause symptoms of depression. SYMPTOMS Symptoms of normal sadness and normal grief include the following:  Feeling sad or crying for short periods of time.  Not  caring about anything (apathy).  Difficulty sleeping or sleeping too much.  No longer able to enjoy the things you used to enjoy.  Desire to be by oneself all the time (social isolation).  Lack of energy or motivation.  Difficulty concentrating or remembering.  Change in appetite or weight.  Restlessness or agitation. Symptoms of clinical depression include the same symptoms of normal sadness or normal grief and also the following symptoms:  Feeling sad or crying all the time.  Feelings of guilt or worthlessness.  Feelings of hopelessness or helplessness.  Thoughts of suicide or the desire to harm yourself (suicidal ideation).  Loss of touch with reality (psychotic symptoms). Seeing or hearing things that are not real (hallucinations) or having false beliefs about your life or the people around you (delusions and paranoia). DIAGNOSIS  The diagnosis of clinical depression is usually based on how bad the symptoms are and how long they have lasted. Your health care provider will also ask you questions about your medical history and substance use to find out if physical illness, use of prescription medicine, or substance abuse is causing your depression. Your health care provider may also order blood tests. TREATMENT  Often, normal sadness and normal grief do not require treatment. However, sometimes antidepressant medicine is given for bereavement to ease the depressive symptoms until they resolve. The treatment for clinical depression depends on how bad the symptoms are but often includes antidepressant medicine, counseling with a mental health professional, or both. Your health care provider will help to determine what treatment  is best for you. Depression caused by physical illness usually goes away with appropriate medical treatment of the illness. If prescription medicine is causing depression, talk with your health care provider about stopping the medicine, decreasing the dose, or  changing to another medicine. Depression caused by the abuse of alcohol or illicit drugs goes away when you stop using these substances. Some adults need professional help in order to stop drinking or using drugs. SEEK IMMEDIATE MEDICAL CARE IF:  You have thoughts about hurting yourself or others.  You lose touch with reality (have psychotic symptoms).  You are taking medicine for depression and have a serious side effect. FOR MORE INFORMATION  National Alliance on Mental Illness: www.nami.[employer]org  National Institute of Mental Health: http://www.[place]nard.net/www.nimh.nih.gov Document Released: 09/20/2000 Document Revised: 02/07/2014 Document Reviewed: 12/23/2011 [facility]Exit[facility] Patient Information 2015 [place], [place]. This information is not intended to replace advice given to you by your health care provider. Make sure you discuss any questions you have with your health care provider.

## 2014-08-12 NOTE — ED Notes (Signed)
This RN entered room to see pt hyperventilating.  Reporting "abdominal pain that came first and that I have anxiety but nothing like this has ever happened before."

## 2014-08-13 ENCOUNTER — Emergency Department (HOSPITAL_COMMUNITY)
Admission: EM | Admit: 2014-08-13 | Discharge: 2014-08-13 | Disposition: A | Discharge status: Home / Self Care | Payer: 59 | Attending: Emergency Medicine | Admitting: Emergency Medicine

## 2014-08-13 ENCOUNTER — Emergency Department (HOSPITAL_COMMUNITY): Payer: 59

## 2014-08-13 ENCOUNTER — Encounter (HOSPITAL_COMMUNITY): Payer: Self-pay | Admitting: Cardiology

## 2014-08-13 DIAGNOSIS — Z8742 Personal history of other diseases of the female genital tract: Secondary | ICD-10-CM | POA: Insufficient documentation

## 2014-08-13 DIAGNOSIS — R002 Palpitations: Secondary | ICD-10-CM | POA: Insufficient documentation

## 2014-08-13 DIAGNOSIS — R0602 Shortness of breath: Secondary | ICD-10-CM | POA: Insufficient documentation

## 2014-08-13 DIAGNOSIS — F410 Panic disorder [episodic paroxysmal anxiety] without agoraphobia: Secondary | ICD-10-CM

## 2014-08-13 DIAGNOSIS — Z87442 Personal history of urinary calculi: Secondary | ICD-10-CM | POA: Insufficient documentation

## 2014-08-13 DIAGNOSIS — Z87891 Personal history of nicotine dependence: Secondary | ICD-10-CM | POA: Insufficient documentation

## 2014-08-13 DIAGNOSIS — Z880 Allergy status to penicillin: Secondary | ICD-10-CM | POA: Insufficient documentation

## 2014-08-13 DIAGNOSIS — F419 Anxiety disorder, unspecified: Secondary | ICD-10-CM | POA: Insufficient documentation

## 2014-08-13 DIAGNOSIS — R079 Chest pain, unspecified: Secondary | ICD-10-CM | POA: Diagnosis present

## 2014-08-13 DIAGNOSIS — R000 Tachycardia, unspecified: Secondary | ICD-10-CM | POA: Diagnosis not present

## 2014-08-13 DIAGNOSIS — Z8619 Personal history of other infectious and parasitic diseases: Secondary | ICD-10-CM | POA: Diagnosis not present

## 2014-08-13 MED ORDER — LORAZEPAM 2 MG/ML IJ SOLN
1.0000 mg | Freq: Once | INTRAMUSCULAR | Status: DC
  Filled 2014-08-13: qty 1

## 2014-08-13 MED ORDER — KETOROLAC TROMETHAMINE 15 MG/ML IJ SOLN
15.0000 mg | Freq: Once | INTRAMUSCULAR | Status: AC
  Administered 2014-08-13: 15 mg via INTRAVENOUS
  Filled 2014-08-13: qty 1

## 2014-08-13 MED ORDER — HALOPERIDOL LACTATE 5 MG/ML IJ SOLN
2.0000 mg | Freq: Once | INTRAMUSCULAR | Status: AC
  Administered 2014-08-13: 2 mg via INTRAVENOUS
  Filled 2014-08-13: qty 1

## 2014-08-13 MED ORDER — LORAZEPAM 2 MG/ML IJ SOLN
2.0000 mg | Freq: Once | INTRAMUSCULAR | Status: AC
  Administered 2014-08-13: 2 mg via INTRAVENOUS

## 2014-08-13 NOTE — ED Provider Notes (Signed)
CSN: [id number]     Arrival date & time 08/13/14  1615 History   First MD Initiated Contact with Patient 08/13/14 1627     Chief Complaint  Patient presents with  . Chest Pain     (Consider location/radiation/quality/duration/timing/severity/associated sxs/prior Treatment) Patient is a 23 y.o. female presenting with shortness of breath. The history is provided by the patient. No language interpreter was used.  Shortness of Breath Severity:  Severe Onset quality:  Sudden Duration:  2 hours Timing:  Constant Progression:  Worsening Chronicity:  Recurrent Context: emotional upset   Relieved by:  Nothing Worsened by:  Nothing tried Associated symptoms: chest pain   Associated symptoms: no cough, no diaphoresis, no fever, no hemoptysis, no syncope and no vomiting   Risk factors: no hx of PE/DVT, no oral contraceptive use and no tobacco use     Past Medical History  Diagnosis Date  . Endometriosis   . Depression     Diagnosed at age 23  . Anxiety   . Ovarian cyst   . Mononucleosis   . Renal disorder   . Kidney stone    Past Surgical History  Procedure Laterality Date  . Cholecystectomy    . Tonsillectomy    . Laparoscopic endometriosis fulguration    . Laparoscopy N/A 01/04/2014    Procedure: LAPAROSCOPY DIAGNOSTIC;  Surgeon: Adam PhenixJames G Arnold, MD;  Location: [facility];  Service: Gynecology;  Laterality: N/A;   Family History  Problem Relation Age of Onset  . Hypertension Father   . Diabetes Maternal Grandmother    History  Substance Use Topics  . Smoking status: Former Smoker -- 0.50 packs/day    Types: Cigarettes    Quit date: 03/19/2014  . Smokeless tobacco: Never Used  . Alcohol Use: No     Comment: Former user   OB History    Gravida Para Term Preterm AB TAB SAB Ectopic Multiple Living   0 0 0 0 0 0 0 0 0 0      Review of Systems  Constitutional: Negative for fever and diaphoresis.  Respiratory: Positive for shortness of breath. Negative for cough and  hemoptysis.   Cardiovascular: Positive for chest pain and palpitations. Negative for leg swelling and syncope.  Gastrointestinal: Negative for nausea and vomiting.  Musculoskeletal: Negative for back pain.  Neurological: Positive for light-headedness. Negative for syncope.  All other systems reviewed and are negative.     Allergies  Betadine; Reglan; and Amoxicillin  Home Medications   Prior to Admission medications   Medication Sig Start Date End Date Taking? Authorizing Provider  acetaminophen (TYLENOL) 500 MG tablet Take 1,000 mg by mouth every 6 (six) hours as needed for mild pain.    Historical Provider, MD  clonazePAM (KLONOPIN) 0.5 MG tablet Take 1 tablet (0.5 mg total) by mouth 2 (two) times daily as needed for anxiety. 08/12/14   Gilda Creasehristopher J. Pollina, MD  ibuprofen (ADVIL,MOTRIN) 400 MG tablet Take 1 tablet (400 mg total) by mouth every 6 (six) hours as needed. 07/12/14   Mellody DrownLauren Parker, PA-C  leuprolide (LUPRON DEPOT) 11.25 MG injection Inject 11.25 mg into the muscle every 3 (three) months.     Historical Provider, MD  naproxen sodium (ANAPROX) 220 MG tablet Take 220 mg by mouth daily as needed (for pain).    Historical Provider, MD  oseltamivir (TAMIFLU) 75 MG capsule Take 75 mg by mouth 2 (two) times daily.    Historical Provider, MD  promethazine (PHENERGAN) 25 MG tablet Take 25 mg by  mouth every 6 (six) hours as needed for nausea or vomiting.    Historical Provider, MD   BP 100/60 mmHg  Pulse 150  Resp 30 Physical Exam  Constitutional: She is oriented to person, place, and time. She appears well-developed and well-nourished.  HENT:  Head: Normocephalic.  Eyes: Pupils are equal, round, and reactive to light.  Neck: No tracheal deviation present.  Cardiovascular: Regular rhythm, normal heart sounds and intact distal pulses.  Tachycardia present.   Pulmonary/Chest: No accessory muscle usage or stridor. Tachypnea noted. She has no decreased breath sounds. She has no  wheezes. She has no rhonchi.  Abdominal: Soft. She exhibits no distension.  Musculoskeletal: She exhibits no edema.  Neurological: She is alert and oriented to person, place, and time.  Skin: Skin is warm.  Psychiatric: Her mood appears anxious.  Vitals reviewed.   ED Course  Procedures (including critical care time) Labs Review Labs Reviewed - No data to display  Imaging Review Dg Chest Portable 1 View  08/13/2014   CLINICAL DATA:  extreme resp distress. Chest pain. No cough or congestion. No fever. No n/v. ?anxiety attack.  EXAM: PORTABLE CHEST - 1 VIEW  COMPARISON:  08/12/2014.  FINDINGS: Normal sized heart. Clear lungs with normal vascularity. Stable Mild scoliosis.  IMPRESSION: No acute abnormality.   Electronically Signed   By: Gordan PaymentSteve  Reid M.D.   On: 08/13/2014 17:27   Dg Chest Port 1 View  08/12/2014   CLINICAL DATA:  Chest pain  EXAM: PORTABLE CHEST - 1 VIEW  COMPARISON:  05/10/2014  FINDINGS: Lungs are clear.  No pleural effusion or pneumothorax.  The heart is normal in size.  IMPRESSION: No evidence of acute cardiopulmonary disease.   Electronically Signed   By: Charline BillsSriyesh  Krishnan M.D.   On: 08/12/2014 19:20     EKG Interpretation   Date/Time:  Saturday August 13 2014 17:04:50 EST Ventricular Rate:  123 PR Interval:  139 QRS Duration: 85 QT Interval:  324 QTC Calculation: 463 R Axis:   112 Text Interpretation:  Sinus tachycardia Right axis deviation Similar to  prior Confirmed by Gwendolyn GrantWALDEN  MD, BLAIR ([id number]) on 08/13/2014 5:06:49 PM      MDM   Final diagnoses:  SOB (shortness of breath)  Anxiety attack    23 y/o female with SOB, chest pain onset this afternoon. No hypoxia. Seen yesterday for same with negative troponin and d dimer. Normal CXR. EKG with sinus tachycardia. Pt very anxious. Clear breath sounds. Tachycardic in 130s. Will give ativan and reassess.  Pt remained anxiety and hyperventilating. Haldol 2 mg given. Pt now resting comfortably. On waking,  respiratory distress resolved. Pt given klonazepam and BH f/u on visit last night. Stressed importance of f/u.   Abagail KitchensMegan Kateline Kinkade, MD 08/13/14 2316  Elwin MochaBlair Walden, MD 08/13/14 2328

## 2014-08-13 NOTE — Discharge Instructions (Signed)

## 2014-08-13 NOTE — ED Notes (Signed)
Pt states midsternal chest pain and SOB. Pt hyperventilating and unable to speak complete sentences at the time. Lung sounds clear and equal bilaterally.
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
## 2014-08-13 NOTE — ED Notes (Signed)
Pt reports that she was seen here last night for the same. States that she started having chest pain and SOB this afternoon. Pt unable to speak in complete sentences at this time.

## 2014-08-13 NOTE — ED Notes (Signed)
Pt calm and cooperative at present. Respirations unlabored. Pt is resting quietly with eyes closed.

## 2014-08-13 NOTE — ED Notes (Addendum)
Pt states "put a tube down my throat, I can't breathe." 100% on 15L at present.

## 2014-08-13 NOTE — ED Notes (Signed)
Unable to get ECG in triage due to patient movement.

## 2014-08-13 NOTE — ED Notes (Signed)
Pt verbalizes understanding of d/c instructions and directions for follow-up care. Pt verbalizes no additional questions or concerns at this time. 

## 2014-08-23 NOTE — Telephone Encounter (Signed)
Opened in error

## 2014-09-20 ENCOUNTER — Emergency Department (HOSPITAL_COMMUNITY)
Admission: EM | Admit: 2014-09-20 | Discharge: 2014-09-20 | Disposition: A | Discharge status: Home / Self Care | Payer: 59 | Attending: Emergency Medicine | Admitting: Emergency Medicine

## 2014-09-20 ENCOUNTER — Emergency Department (HOSPITAL_COMMUNITY): Payer: 59

## 2014-09-20 ENCOUNTER — Encounter (HOSPITAL_COMMUNITY): Payer: Self-pay

## 2014-09-20 DIAGNOSIS — Z880 Allergy status to penicillin: Secondary | ICD-10-CM | POA: Diagnosis not present

## 2014-09-20 DIAGNOSIS — Z9049 Acquired absence of other specified parts of digestive tract: Secondary | ICD-10-CM | POA: Diagnosis not present

## 2014-09-20 DIAGNOSIS — Z3202 Encounter for pregnancy test, result negative: Secondary | ICD-10-CM | POA: Diagnosis not present

## 2014-09-20 DIAGNOSIS — Z87891 Personal history of nicotine dependence: Secondary | ICD-10-CM | POA: Diagnosis not present

## 2014-09-20 DIAGNOSIS — Z8619 Personal history of other infectious and parasitic diseases: Secondary | ICD-10-CM | POA: Diagnosis not present

## 2014-09-20 DIAGNOSIS — F419 Anxiety disorder, unspecified: Secondary | ICD-10-CM | POA: Diagnosis not present

## 2014-09-20 DIAGNOSIS — Z79899 Other long term (current) drug therapy: Secondary | ICD-10-CM | POA: Diagnosis not present

## 2014-09-20 DIAGNOSIS — Z87442 Personal history of urinary calculi: Secondary | ICD-10-CM | POA: Diagnosis not present

## 2014-09-20 DIAGNOSIS — N390 Urinary tract infection, site not specified: Secondary | ICD-10-CM | POA: Diagnosis not present

## 2014-09-20 DIAGNOSIS — F329 Major depressive disorder, single episode, unspecified: Secondary | ICD-10-CM | POA: Diagnosis not present

## 2014-09-20 DIAGNOSIS — R52 Pain, unspecified: Secondary | ICD-10-CM

## 2014-09-20 DIAGNOSIS — R109 Unspecified abdominal pain: Secondary | ICD-10-CM | POA: Diagnosis present

## 2014-09-20 LAB — CBC WITH DIFFERENTIAL/PLATELET
Basophils Absolute: 0 10*3/uL (ref 0.0–0.1)
Basophils Relative: 0 % (ref 0–1)
EOS ABS: 0 10*3/uL (ref 0.0–0.7)
Eosinophils Relative: 0 % (ref 0–5)
HCT: 42.5 % (ref 36.0–46.0)
HEMOGLOBIN: 14.8 g/dL (ref 12.0–15.0)
LYMPHS ABS: 1.5 10*3/uL (ref 0.7–4.0)
LYMPHS PCT: 13 % (ref 12–46)
MCH: 32.8 pg (ref 26.0–34.0)
MCHC: 34.8 g/dL (ref 30.0–36.0)
MCV: 94.2 fL (ref 78.0–100.0)
Monocytes Absolute: 0.7 10*3/uL (ref 0.1–1.0)
Monocytes Relative: 6 % (ref 3–12)
NEUTROS PCT: 81 % — AB (ref 43–77)
Neutro Abs: 9.7 10*3/uL — ABNORMAL HIGH (ref 1.7–7.7)
Platelets: 250 10*3/uL (ref 150–400)
RBC: 4.51 MIL/uL (ref 3.87–5.11)
RDW: 12.8 % (ref 11.5–15.5)
WBC: 11.9 10*3/uL — AB (ref 4.0–10.5)

## 2014-09-20 LAB — COMPREHENSIVE METABOLIC PANEL
ALK PHOS: 92 U/L (ref 39–117)
ALT: 14 U/L (ref 0–35)
AST: 18 U/L (ref 0–37)
Albumin: 5.1 g/dL (ref 3.5–5.2)
Anion gap: 18 — ABNORMAL HIGH (ref 5–15)
BILIRUBIN TOTAL: 1.2 mg/dL (ref 0.3–1.2)
BUN: 12 mg/dL (ref 6–23)
CHLORIDE: 100 meq/L (ref 96–112)
CO2: 23 mEq/L (ref 19–32)
Calcium: 9.9 mg/dL (ref 8.4–10.5)
Creatinine, Ser: 0.6 mg/dL (ref 0.50–1.10)
GFR calc non Af Amer: >90 mL/min (ref >90)
GLUCOSE: 87 mg/dL (ref 70–99)
POTASSIUM: 3.7 meq/L (ref 3.7–5.3)
Sodium: 141 mEq/L (ref 137–147)
Total Protein: 8 g/dL (ref 6.0–8.3)

## 2014-09-20 LAB — URINE MICROSCOPIC-ADD ON

## 2014-09-20 LAB — URINALYSIS, ROUTINE W REFLEX MICROSCOPIC
GLUCOSE, UA: NEGATIVE mg/dL
Ketones, ur: 15 mg/dL — AB
Nitrite: NEGATIVE
PROTEIN: NEGATIVE mg/dL
Specific Gravity, Urine: 1.029 (ref 1.005–1.030)
UROBILINOGEN UA: 0.2 mg/dL (ref 0.0–1.0)
pH: 5 (ref 5.0–8.0)

## 2014-09-20 LAB — POC URINE PREG, ED: Preg Test, Ur: NEGATIVE

## 2014-09-20 MED ORDER — CIPROFLOXACIN IN D5W 400 MG/200ML IV SOLN
400.0000 mg | Freq: Once | INTRAVENOUS | Status: AC
  Administered 2014-09-20: 400 mg via INTRAVENOUS
  Filled 2014-09-20: qty 200

## 2014-09-20 MED ORDER — HYDROMORPHONE HCL 1 MG/ML IJ SOLN
1.0000 mg | Freq: Once | INTRAMUSCULAR | Status: AC
  Administered 2014-09-20: 1 mg via INTRAVENOUS
  Filled 2014-09-20: qty 1

## 2014-09-20 MED ORDER — KETOROLAC TROMETHAMINE 30 MG/ML IJ SOLN
30.0000 mg | Freq: Once | INTRAMUSCULAR | Status: AC
  Administered 2014-09-20: 30 mg via INTRAVENOUS
  Filled 2014-09-20: qty 1

## 2014-09-20 MED ORDER — ONDANSETRON 4 MG PO TBDP: ORAL_TABLET | ORAL | Status: AC

## 2014-09-20 MED ORDER — ONDANSETRON HCL 4 MG/2ML IJ SOLN
4.0000 mg | Freq: Once | INTRAMUSCULAR | Status: AC
  Administered 2014-09-20: 4 mg via INTRAVENOUS
  Filled 2014-09-20: qty 2

## 2014-09-20 MED ORDER — CIPROFLOXACIN IN D5W 400 MG/200ML IV SOLN: 400.0000 mg | Freq: Once | INTRAVENOUS | Status: DC

## 2014-09-20 MED ORDER — CIPROFLOXACIN HCL 500 MG PO TABS: 500.0000 mg | ORAL_TABLET | Freq: Two times a day (BID) | ORAL | Status: AC

## 2014-09-20 MED ORDER — OXYCODONE-ACETAMINOPHEN 5-325 MG PO TABS: 1.0000 | ORAL_TABLET | Freq: Four times a day (QID) | ORAL | Status: AC | PRN

## 2014-09-20 NOTE — ED Notes (Signed)
Pt here for RLQ and right flank pain since last night. Has been nauseated but denies any vomiting or urinary symptoms. Is on lupron and LMP was 7 mths ago.

## 2014-09-20 NOTE — Discharge Instructions (Signed)
Follow up with [facility] and your family md

## 2014-09-20 NOTE — ED Notes (Signed)
Patient states is having a "panic attack and my ribs hurt on both sides when that happens".   Patient talked with RN and symptoms started to resolve.

## 2014-09-21 NOTE — ED Provider Notes (Signed)
CSN: [id number]     Arrival date & time 09/20/14  4098 History   First MD Initiated Contact with Patient 09/20/14 [phone]     Chief Complaint  Patient presents with  . Abdominal Pain  . Flank Pain     (Consider location/radiation/quality/duration/timing/severity/associated sxs/prior Treatment) Patient is a 23 y.o. female presenting with abdominal pain and flank pain. The history is provided by the patient (the pt complains of right flank pain).  Abdominal Pain Pain location:  Generalized Pain quality: aching   Pain radiates to:  Does not radiate Pain severity:  Moderate Timing:  Constant Progression:  Waxing and waning Chronicity:  New Context: not alcohol use   Associated symptoms: no chest pain, no cough, no diarrhea, no fatigue and no hematuria   Flank Pain Associated symptoms include abdominal pain. Pertinent negatives include no chest pain and no headaches.    Past Medical History  Diagnosis Date  . Endometriosis   . Depression     Diagnosed at age 13  . Anxiety   . Ovarian cyst   . Mononucleosis   . Renal disorder   . Kidney stone    Past Surgical History  Procedure Laterality Date  . Cholecystectomy    . Tonsillectomy    . Laparoscopic endometriosis fulguration    . Laparoscopy N/A 01/04/2014    Procedure: LAPAROSCOPY DIAGNOSTIC;  Surgeon: Adam Phenix, MD;  Location: [facility];  Service: Gynecology;  Laterality: N/A;   Family History  Problem Relation Age of Onset  . Hypertension Father   . Diabetes Maternal Grandmother    History  Substance Use Topics  . Smoking status: Former Smoker -- 0.50 packs/day    Types: Cigarettes    Quit date: 03/19/2014  . Smokeless tobacco: Never Used  . Alcohol Use: No     Comment: Former user   OB History    Gravida Para Term Preterm AB TAB SAB Ectopic Multiple Living   0 0 0 0 0 0 0 0 0 0      Review of Systems  Constitutional: Negative for appetite change and fatigue.  HENT: Negative for congestion, ear discharge  and sinus pressure.   Eyes: Negative for discharge.  Respiratory: Negative for cough.   Cardiovascular: Negative for chest pain.  Gastrointestinal: Positive for abdominal pain. Negative for diarrhea.  Genitourinary: Positive for flank pain. Negative for frequency and hematuria.  Musculoskeletal: Negative for back pain.  Skin: Negative for rash.  Neurological: Negative for seizures and headaches.  Psychiatric/Behavioral: Negative for hallucinations.      Allergies  Betadine; Reglan; and Amoxicillin  Home Medications   Prior to Admission medications   Medication Sig Start Date End Date Taking? Authorizing Provider  acetaminophen (TYLENOL) 325 MG tablet Take 650 mg by mouth every 6 (six) hours as needed for mild pain.   Yes Historical Provider, MD  busPIRone (BUSPAR) 5 MG tablet Take 5 mg by mouth 2 (two) times daily.   Yes Historical Provider, MD  FLUoxetine (PROZAC) 10 MG tablet Take 10 mg by mouth daily.   Yes Historical Provider, MD  ciprofloxacin (CIPRO) 500 MG tablet Take 1 tablet (500 mg total) by mouth 2 (two) times daily. One po bid x 7 days 09/20/14   Benny Lennert, MD  clonazePAM (KLONOPIN) 0.5 MG tablet Take 1 tablet (0.5 mg total) by mouth 2 (two) times daily as needed for anxiety. Patient not taking: Reported on 09/20/2014 08/12/14   Gilda Crease, MD  ibuprofen (ADVIL,MOTRIN) 400 MG tablet Take  1 tablet (400 mg total) by mouth every 6 (six) hours as needed. Patient not taking: Reported on 09/20/2014 07/12/14   Mellody DrownLauren Parker, PA-C  ondansetron (ZOFRAN ODT) 4 MG disintegrating tablet 4mg  ODT q4 hours prn nausea/vomit 09/20/14   Benny LennertJoseph L Ben Sanz, MD  oxyCODONE-acetaminophen (PERCOCET/ROXICET) 5-325 MG per tablet Take 1 tablet by mouth every 6 (six) hours as needed for severe pain. 09/20/14   Benny LennertJoseph L Arshia Spellman, MD   BP 101/54 mmHg  Pulse 89  Temp(Src) 97.5 F (36.4 C) (Oral)  Resp 18  Ht 5\' 4"  (1.626 m)  Wt 106 lb (48.081 kg)  BMI 18.19 kg/m2  SpO2 98%  LMP   Physical Exam  Constitutional: She is oriented to person, place, and time. She appears well-developed.  HENT:  Head: Normocephalic.  Eyes: Conjunctivae and EOM are normal. No scleral icterus.  Neck: Neck supple. No thyromegaly present.  Cardiovascular: Normal rate and regular rhythm.  Exam reveals no gallop and no friction rub.   No murmur heard. Pulmonary/Chest: No stridor. She has no wheezes. She has no rales. She exhibits no tenderness.  Abdominal: She exhibits no distension. There is no tenderness. There is no rebound.  Genitourinary:  Tender right flank  Musculoskeletal: Normal range of motion. She exhibits no edema.  Lymphadenopathy:    She has no cervical adenopathy.  Neurological: She is oriented to person, place, and time. She exhibits normal muscle tone. Coordination normal.  Skin: No rash noted. No erythema.  Psychiatric: She has a normal mood and affect. Her behavior is normal.    ED Course  Procedures (including critical care time) Labs Review Labs Reviewed  CBC WITH DIFFERENTIAL - Abnormal; Notable for the following:    WBC 11.9 (*)    Neutrophils Relative % 81 (*)    Neutro Abs 9.7 (*)    All other components within normal limits  COMPREHENSIVE METABOLIC PANEL - Abnormal; Notable for the following:    Anion gap 18 (*)    All other components within normal limits  URINALYSIS, ROUTINE W REFLEX MICROSCOPIC - Abnormal; Notable for the following:    Color, Urine AMBER (*)    APPearance HAZY (*)    Hgb urine dipstick MODERATE (*)    Bilirubin Urine SMALL (*)    Ketones, ur 15 (*)    Leukocytes, UA SMALL (*)    All other components within normal limits  URINE MICROSCOPIC-ADD ON - Abnormal; Notable for the following:    Squamous Epithelial / LPF MANY (*)    Bacteria, UA MANY (*)    Crystals CA OXALATE CRYSTALS (*)    All other components within normal limits  POC URINE PREG, ED    Imaging Review Ct Renal Stone Study  09/20/2014   CLINICAL DATA:  Right flank  pain starting yesterday  EXAM: CT ABDOMEN AND PELVIS WITHOUT CONTRAST  TECHNIQUE: Multidetector CT imaging of the abdomen and pelvis was performed following the standard protocol without IV contrast.  COMPARISON:  08/14/2014  FINDINGS: Lung bases are unremarkable. Sagittal images of the spine are unremarkable.  Unenhanced liver shows no biliary ductal dilatation. The patient is status post cholecystectomy. Unenhanced pancreas, spleen and adrenal glands are remarkable. Stable nonobstructive calcified calculus lower pole of the right kidney measures 2.6 mm. No hydronephrosis or hydroureter. No calcified ureteral calculi are noted bilaterally. Abdominal aorta is unremarkable.  There is no small bowel obstruction. No ascites or free air. No adenopathy. Some colonic gas noted in right colon and transverse colon. There is a  low lying cecum. The tip of the cecum is in right anterior pelvis just above the urinary bladder. No pericecal inflammation.  The appendix is not identified. Unenhanced uterus and adnexa are unremarkable. No pelvic ascites or adenopathy. Small amount of nonspecific fluid is noted within the vagina. No calcified calculi are noted within under distended urinary bladder. Moderate stool noted within rectum and sigmoid colon.  IMPRESSION: 1. Stable right nonobstructive nephrolithiasis. No hydronephrosis or hydroureter. 2. No calcified ureteral calculi are noted bilaterally. 3. There is some colonic gas in right colon and transverse colon. No colonic obstruction. Low lying cecum. No pericecal inflammation. The appendix is not identified. 4. No small bowel obstruction. 5. No calcified calculi are noted within urinary bladder. 6. Moderate stool noted in rectosigmoid colon.   Electronically Signed   By: Natasha MeadLiviu  Pop M.D.   On: 09/20/2014 12:41     EKG Interpretation None      MDM   Final diagnoses:  Pain  UTI (lower urinary tract infection)    tx uti with cipro and follow up urology      Benny LennertJoseph  L Gurtha Picker, MD 09/21/14 [phone]

## 2015-03-09 IMAGING — CR DG ELBOW COMPLETE 3+V*R*
4 series · 4 of 4 positions shown · non-contrast
Comparison: None.

CLINICAL DATA: Tripped by dog, landed on right elbow. Right elbow
pain.

EXAM:
RIGHT ELBOW - COMPLETE 3+ VIEW

[x elbow lat right (1 of 4)]
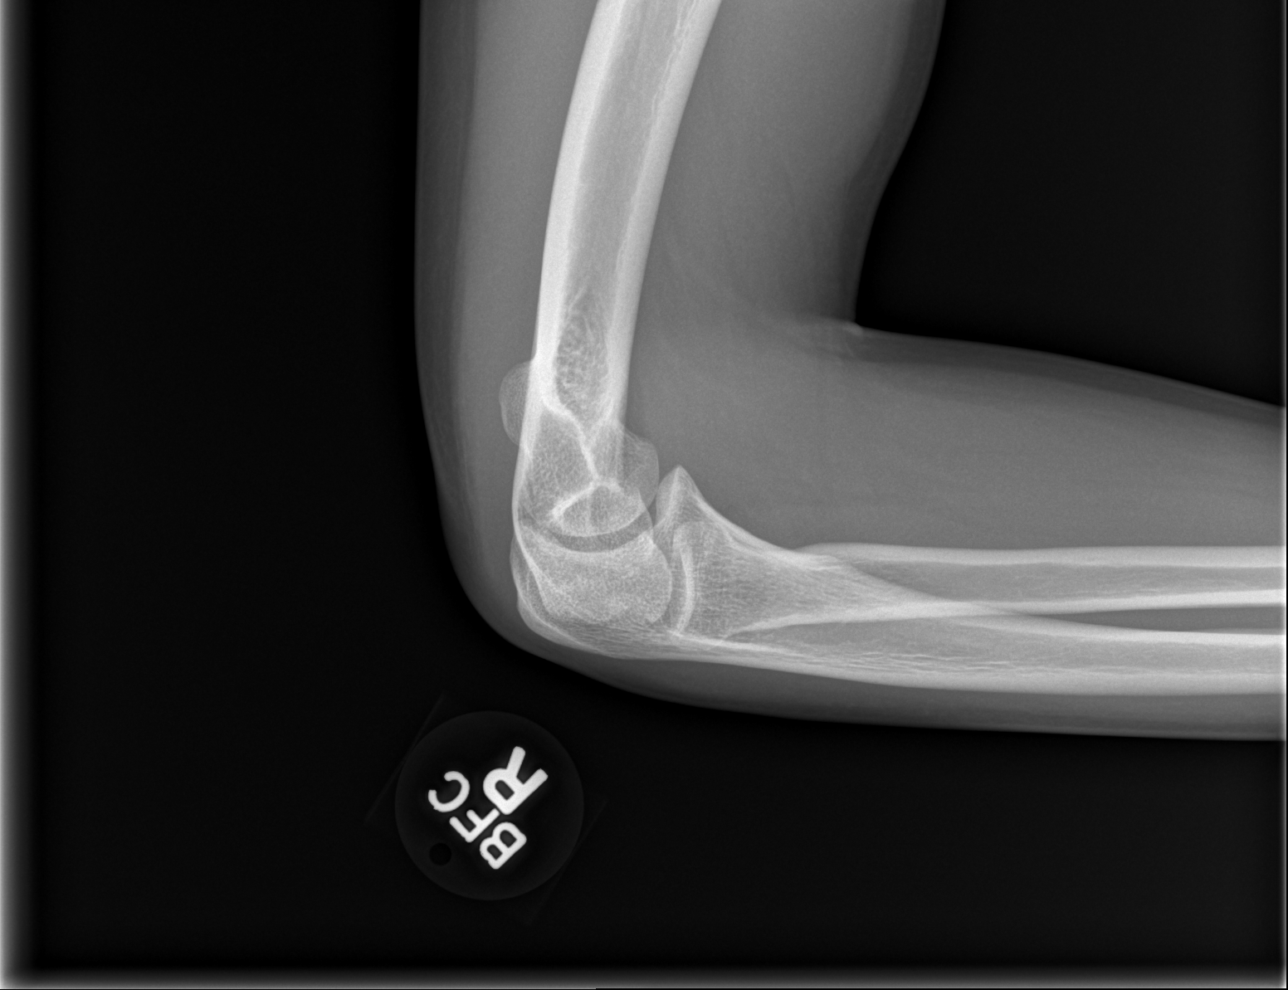

[x elbow lat right (2 of 4)]
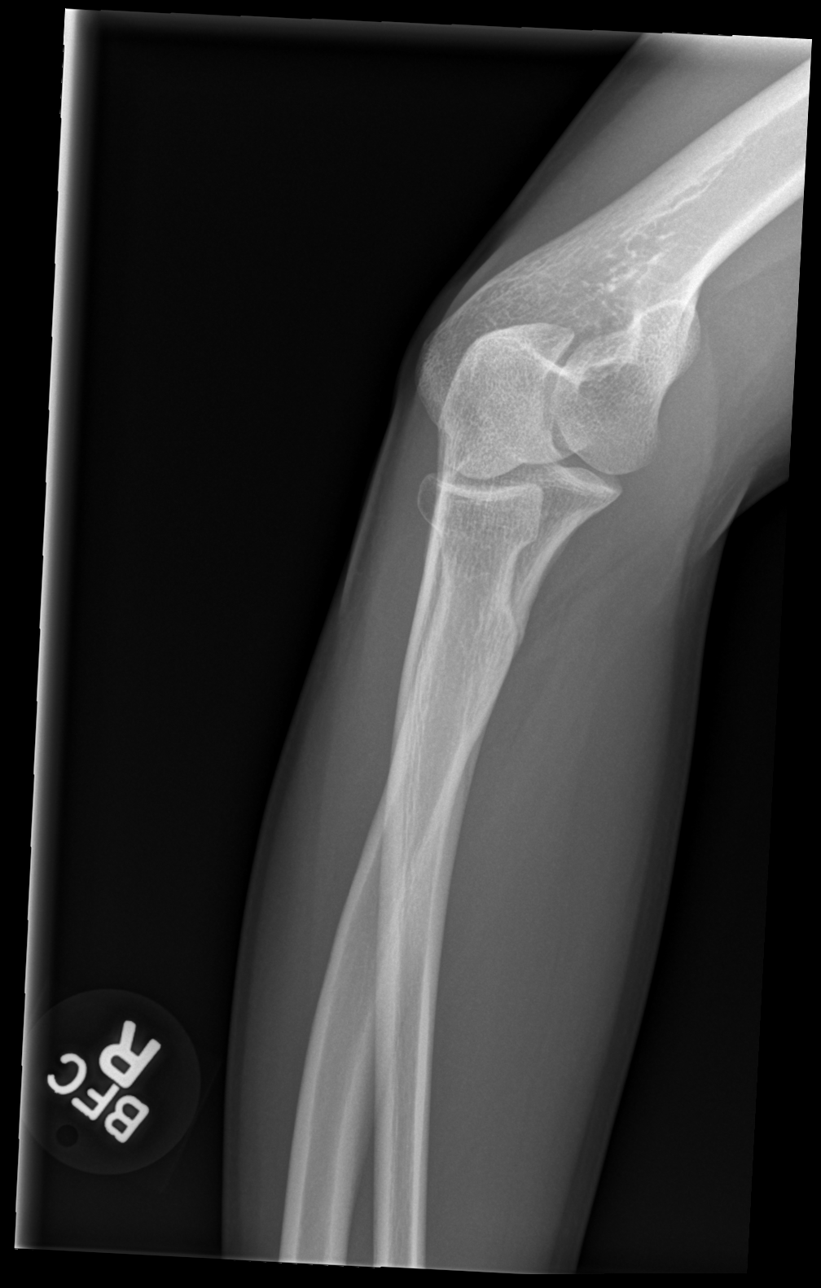

[x elbow lat right (3 of 4)]
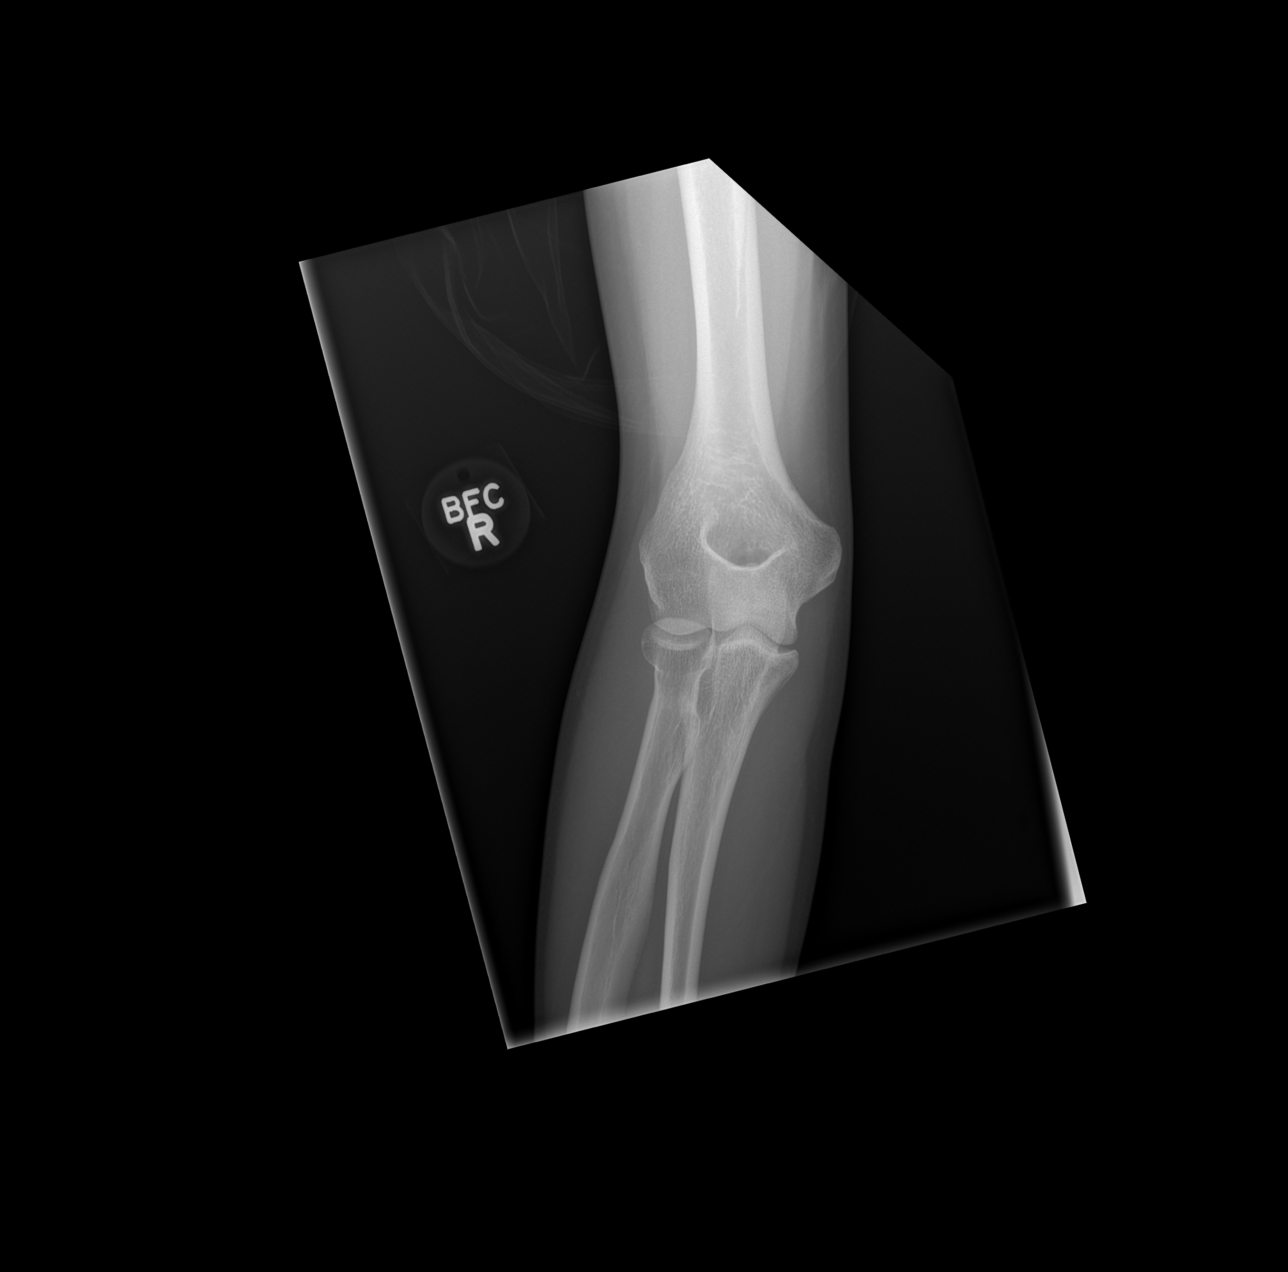

[x elbow lat right (4 of 4)]
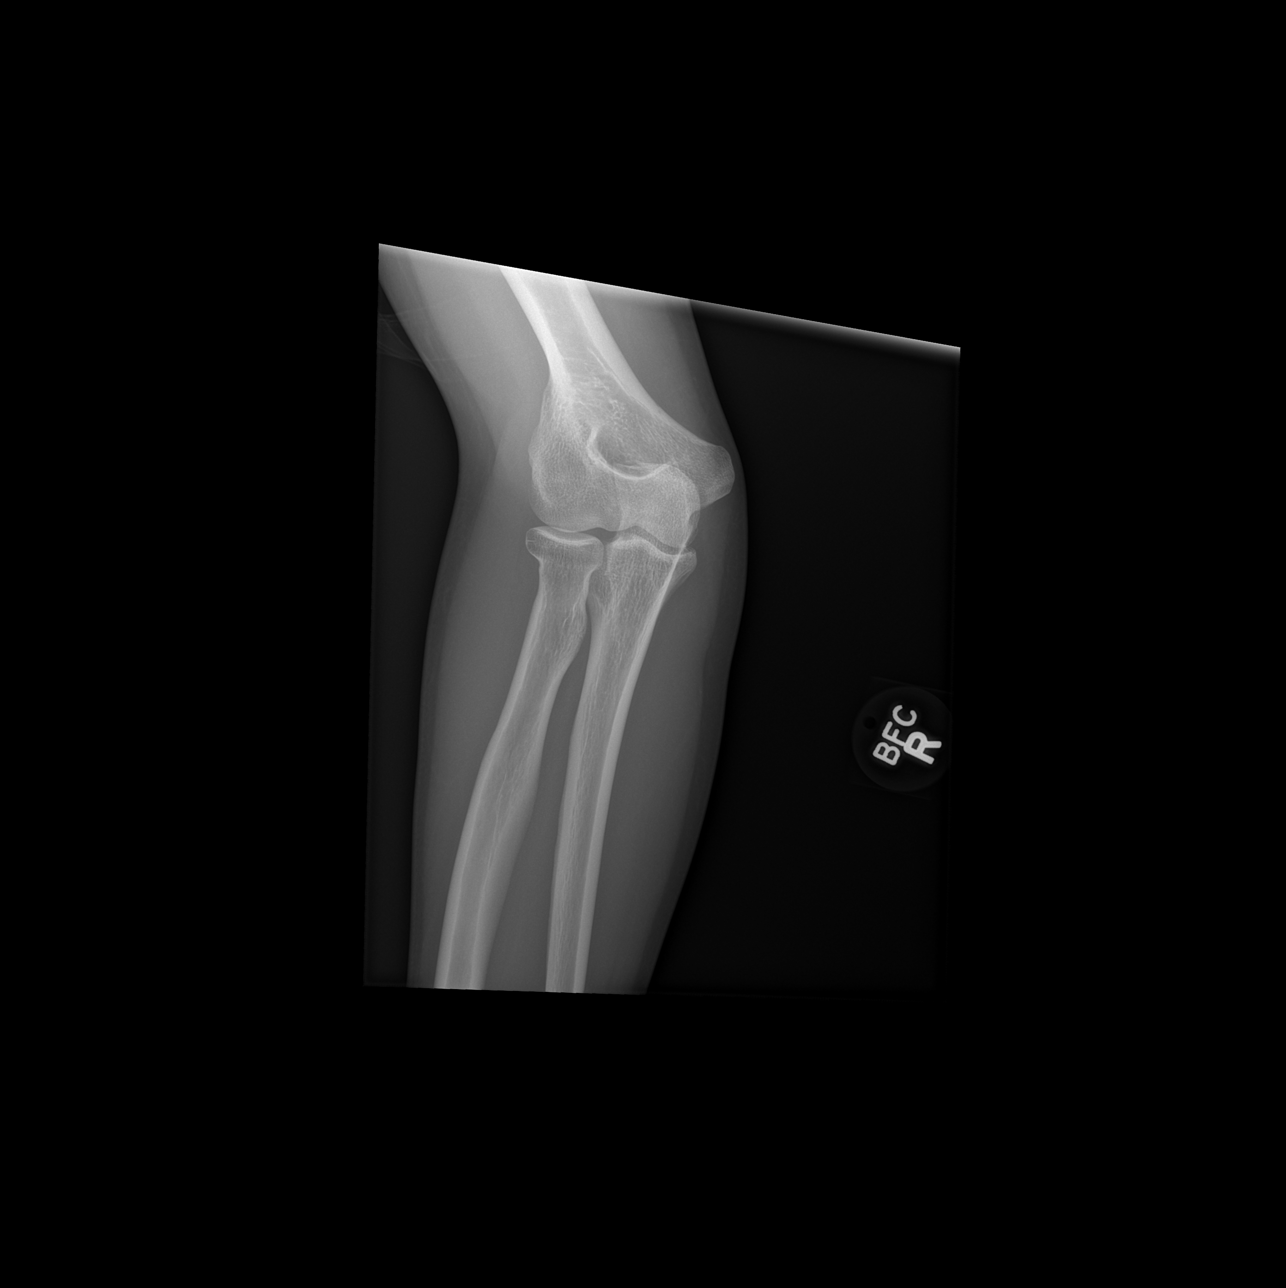

[4 of 4 positions shown; findings below may reference images not displayed]

FINDINGS: There is no evidence of fracture, dislocation, or joint effusion.
There is no evidence of arthropathy or other focal bone abnormality.
Soft tissues are unremarkable.
IMPRESSION: Negative.

## 2015-03-24 IMAGING — CR DG CHEST 1V PORT
1 series · 1 of 1 positions shown · non-contrast
Comparison: 08/12/2014.

CLINICAL DATA: extreme resp distress. Chest pain. No cough or
congestion. No fever. No n/v. ?anxiety attack.

EXAM:
PORTABLE CHEST - 1 VIEW

[AP]
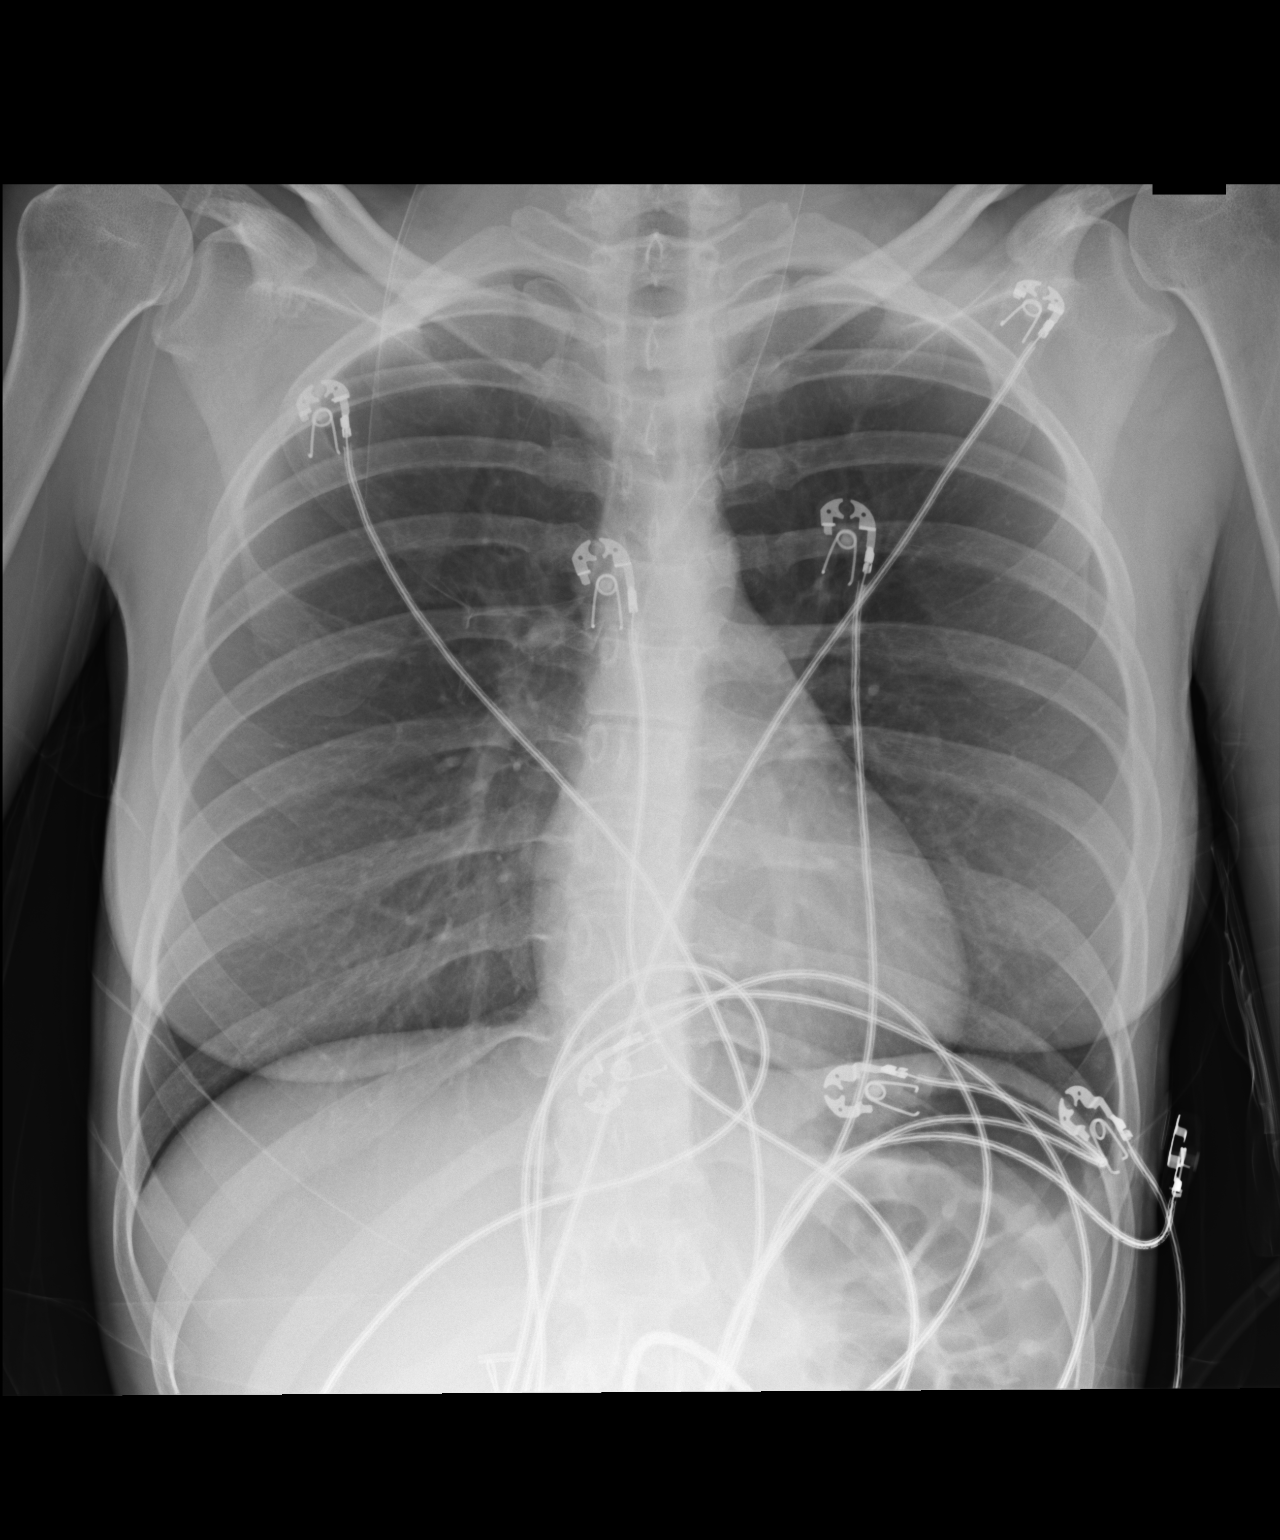

[1 of 1 positions shown; findings below may reference images not displayed]

FINDINGS: Normal sized heart. Clear lungs with normal vascularity. Stable Mild
scoliosis.
IMPRESSION: No acute abnormality.
